# Patient Record
Sex: Female | Born: 1960
Health system: Southern US, Community
[De-identification: ages and names within clinical notes are randomized; demographics above are authoritative.]

## PROBLEM LIST (undated history)

## (undated) DIAGNOSIS — R519 Headache, unspecified: Secondary | ICD-10-CM

## (undated) DIAGNOSIS — M069 Rheumatoid arthritis, unspecified: Secondary | ICD-10-CM

## (undated) DIAGNOSIS — F419 Anxiety disorder, unspecified: Secondary | ICD-10-CM

## (undated) DIAGNOSIS — K219 Gastro-esophageal reflux disease without esophagitis: Secondary | ICD-10-CM

## (undated) DIAGNOSIS — I10 Essential (primary) hypertension: Secondary | ICD-10-CM

## (undated) DIAGNOSIS — K589 Irritable bowel syndrome without diarrhea: Secondary | ICD-10-CM

## (undated) DIAGNOSIS — J302 Other seasonal allergic rhinitis: Secondary | ICD-10-CM

## (undated) DIAGNOSIS — K115 Sialolithiasis: Secondary | ICD-10-CM

## (undated) DIAGNOSIS — R51 Headache: Secondary | ICD-10-CM

## (undated) DIAGNOSIS — F329 Major depressive disorder, single episode, unspecified: Secondary | ICD-10-CM

## (undated) DIAGNOSIS — E119 Type 2 diabetes mellitus without complications: Secondary | ICD-10-CM

## (undated) DIAGNOSIS — E669 Obesity, unspecified: Secondary | ICD-10-CM

## (undated) DIAGNOSIS — E785 Hyperlipidemia, unspecified: Secondary | ICD-10-CM

## (undated) DIAGNOSIS — F32A Depression, unspecified: Secondary | ICD-10-CM

## (undated) DIAGNOSIS — T7840XA Allergy, unspecified, initial encounter: Secondary | ICD-10-CM

## (undated) HISTORY — DX: Sialolithiasis: K11.5

## (undated) HISTORY — PX: TEMPOROMANDIBULAR JOINT SURGERY: SHX35

## (undated) HISTORY — DX: Headache, unspecified: R51.9

## (undated) HISTORY — DX: Essential (primary) hypertension: I10

## (undated) HISTORY — PX: SALIVARY GLAND SURGERY: SHX768

## (undated) HISTORY — DX: Rheumatoid arthritis, unspecified: M06.9

## (undated) HISTORY — DX: Depression, unspecified: F32.A

## (undated) HISTORY — DX: Allergy, unspecified, initial encounter: T78.40XA

## (undated) HISTORY — DX: Anxiety disorder, unspecified: F41.9

## (undated) HISTORY — DX: Other seasonal allergic rhinitis: J30.2

## (undated) HISTORY — DX: Hyperlipidemia, unspecified: E78.5

## (undated) HISTORY — DX: Gastro-esophageal reflux disease without esophagitis: K21.9

## (undated) HISTORY — DX: Headache: R51

## (undated) HISTORY — DX: Irritable bowel syndrome, unspecified: K58.9

## (undated) HISTORY — DX: Obesity, unspecified: E66.9

## (undated) HISTORY — DX: Type 2 diabetes mellitus without complications: E11.9

## (undated) HISTORY — DX: Major depressive disorder, single episode, unspecified: F32.9

---

## 1998-07-01 ENCOUNTER — Other Ambulatory Visit: Admission: RE | Admit: 1998-07-01 | Discharge: 1998-07-01 | Payer: Self-pay | Admitting: Obstetrics and Gynecology

## 1999-09-14 ENCOUNTER — Other Ambulatory Visit: Admission: RE | Admit: 1999-09-14 | Discharge: 1999-09-14 | Payer: Self-pay | Admitting: Obstetrics and Gynecology

## 1999-11-06 HISTORY — PX: ABDOMINAL HYSTERECTOMY: SHX81

## 1999-12-17 ENCOUNTER — Emergency Department (HOSPITAL_COMMUNITY): Admission: EM | Admit: 1999-12-17 | Discharge: 1999-12-17 | Payer: Self-pay | Admitting: Emergency Medicine

## 2000-06-25 ENCOUNTER — Inpatient Hospital Stay (HOSPITAL_COMMUNITY): Admission: RE | Admit: 2000-06-25 | Discharge: 2000-06-27 | Payer: Self-pay | Admitting: Obstetrics and Gynecology

## 2001-06-06 ENCOUNTER — Other Ambulatory Visit: Admission: RE | Admit: 2001-06-06 | Discharge: 2001-06-06 | Payer: Self-pay | Admitting: Obstetrics and Gynecology

## 2001-10-03 ENCOUNTER — Encounter: Payer: Self-pay | Admitting: Family Medicine

## 2001-10-03 ENCOUNTER — Ambulatory Visit (HOSPITAL_COMMUNITY): Admission: RE | Admit: 2001-10-03 | Discharge: 2001-10-03 | Payer: Self-pay | Admitting: Family Medicine

## 2002-07-13 ENCOUNTER — Other Ambulatory Visit: Admission: RE | Admit: 2002-07-13 | Discharge: 2002-07-13 | Payer: Self-pay | Admitting: Obstetrics and Gynecology

## 2003-07-16 ENCOUNTER — Other Ambulatory Visit: Admission: RE | Admit: 2003-07-16 | Discharge: 2003-07-16 | Payer: Self-pay | Admitting: Obstetrics and Gynecology

## 2004-08-11 ENCOUNTER — Other Ambulatory Visit: Admission: RE | Admit: 2004-08-11 | Discharge: 2004-08-11 | Payer: Self-pay | Admitting: Obstetrics and Gynecology

## 2005-09-14 ENCOUNTER — Other Ambulatory Visit: Admission: RE | Admit: 2005-09-14 | Discharge: 2005-09-14 | Payer: Self-pay | Admitting: Obstetrics and Gynecology

## 2010-12-31 ENCOUNTER — Inpatient Hospital Stay (INDEPENDENT_AMBULATORY_CARE_PROVIDER_SITE_OTHER)
Admission: RE | Admit: 2010-12-31 | Discharge: 2010-12-31 | Disposition: A | Discharge status: Home / Self Care | Payer: BC Managed Care – PPO | Source: Ambulatory Visit | Attending: Emergency Medicine | Admitting: Emergency Medicine

## 2010-12-31 ENCOUNTER — Ambulatory Visit (INDEPENDENT_AMBULATORY_CARE_PROVIDER_SITE_OTHER): Payer: BC Managed Care – PPO

## 2010-12-31 DIAGNOSIS — S93609A Unspecified sprain of unspecified foot, initial encounter: Secondary | ICD-10-CM

## 2010-12-31 DIAGNOSIS — M129 Arthropathy, unspecified: Secondary | ICD-10-CM

## 2011-08-16 ENCOUNTER — Ambulatory Visit
Admission: RE | Admit: 2011-08-16 | Discharge: 2011-08-16 | Disposition: A | Discharge status: Home / Self Care | Payer: BC Managed Care – PPO | Source: Ambulatory Visit | Attending: Otolaryngology | Admitting: Otolaryngology

## 2011-08-16 ENCOUNTER — Other Ambulatory Visit: Payer: Self-pay | Admitting: Otolaryngology

## 2011-08-16 DIAGNOSIS — R609 Edema, unspecified: Secondary | ICD-10-CM

## 2011-08-16 MED ORDER — IOHEXOL 300 MG/ML  SOLN: 75.0000 mL | Freq: Once | INTRAMUSCULAR | Status: AC | PRN

## 2012-08-01 ENCOUNTER — Ambulatory Visit: Payer: BC Managed Care – PPO | Admitting: Cardiology

## 2012-08-06 ENCOUNTER — Encounter: Payer: Self-pay | Admitting: *Deleted

## 2012-08-07 ENCOUNTER — Encounter: Payer: Self-pay | Admitting: *Deleted

## 2012-08-07 ENCOUNTER — Encounter: Payer: Self-pay | Admitting: Cardiology

## 2012-08-08 ENCOUNTER — Encounter: Payer: Self-pay | Admitting: Cardiology

## 2012-08-08 ENCOUNTER — Ambulatory Visit (INDEPENDENT_AMBULATORY_CARE_PROVIDER_SITE_OTHER): Payer: BC Managed Care – PPO | Admitting: Cardiology

## 2012-08-08 VITALS — BP 172/101 | HR 86 | Ht 67.0 in | Wt 273.8 lb

## 2012-08-08 DIAGNOSIS — R079 Chest pain, unspecified: Secondary | ICD-10-CM | POA: Insufficient documentation

## 2012-08-08 DIAGNOSIS — E785 Hyperlipidemia, unspecified: Secondary | ICD-10-CM

## 2012-08-08 NOTE — Assessment & Plan Note (Signed)
If functional study negative I discussed the importance of exercise and weight loss.

## 2012-08-08 NOTE — Assessment & Plan Note (Signed)
Symptoms atypical. However she does have dyspnea on exertion. Multiple risk factors including strong family history. Plan stress echocardiogram to exclude ischemia to quantify LV function. If normal no further evaluation planned.

## 2012-08-08 NOTE — Assessment & Plan Note (Signed)
Management per primary care. 

## 2012-08-08 NOTE — Progress Notes (Signed)
HPI: 51 year old female with no prior cardiac history evaluation of chest pain. Chest x-ray in June of 2013 unremarkable. Patient complains of intermittent chest pain for approximately 4 months. She has had significant stress at her job and attributes the pain to this. The pain is in various locations on her chest. It is described as a stabbing pain lasting 1 second. It does not radiate. No associated symptoms. Resolves spontaneously. She does not have exertional chest pain. She does have some dyspnea on exertion but no orthopnea, PND, pedal edema or syncope. Because of her chest pain we are asked to evaluate.  Current Outpatient Prescriptions  Medication Sig Dispense Refill  . ALPRAZolam (XANAX) 0.25 MG tablet Take 0.25 mg by mouth as needed.       . fluticasone (FLONASE) 50 MCG/ACT nasal spray Place 2 sprays into the nose as needed.       Marland Kitchen ibuprofen (ADVIL,MOTRIN) 800 MG tablet Take 800 mg by mouth as needed.       Marland Kitchen levocetirizine (XYZAL) 5 MG tablet Take 5 mg by mouth daily.       Marland Kitchen NEXIUM 40 MG capsule Take 40 mg by mouth daily.       Marland Kitchen PRENATAL VITAMINS PO Take 1 tablet by mouth daily.      . sucralfate (CARAFATE) 1 G tablet Take 1 g by mouth 4 (four) times daily.         Allergies  Allergen Reactions  . Codeine Nausea Only  . Topamax (Topiramate) Nausea Only    Past Medical History  Diagnosis Date  . Seasonal allergies   . Obesity   . Hyperlipidemia   . GERD (gastroesophageal reflux disease)     Past Surgical History  Procedure Date  . Temporomandibular joint surgery   . Abdominal hysterectomy   . Salivary gland surgery     History   Social History  . Marital Status: Single    Spouse Name: N/A    Number of Children: N/A  . Years of Education: N/A   Occupational History  . Not on file.   Social History Main Topics  . Smoking status: Former Smoker -- 7 years    Types: Cigarettes  . Smokeless tobacco: Not on file   Comment: 1.5 pack a week  . Alcohol Use: Yes       Rare  . Drug Use: No  . Sexually Active: Not on file   Other Topics Concern  . Not on file   Social History Narrative  . No narrative on file    Family History  Problem Relation Age of Onset  . Lung cancer    . Hypertension    . Heart attack Father     MI at age 75  . Coronary artery disease Brother   . Coronary artery disease Brother     ROS:  no fevers or chills, productive cough, hemoptysis, dysphasia, odynophagia, melena, hematochezia, dysuria, hematuria, rash, seizure activity, orthopnea, PND, pedal edema, claudication. Remaining systems are negative.  Physical Exam:   Blood pressure 172/101, pulse 86, height 5\' 7"  (1.702 m), weight 273 lb 12.8 oz (124.195 kg).  General:  Well developed/morbidly obese in NAD Skin warm/dry Patient not depressed No peripheral clubbing Back-normal HEENT-normal/normal eyelids Neck supple/normal carotid upstroke bilaterally; no bruits; no JVD; no thyromegaly chest - CTA/ normal expansion CV - RRR/normal S1 and S2; no murmurs, rubs or gallops;  PMI nondisplaced Abdomen -NT/ND, no HSM, no mass, + bowel sounds, no bruit 2+ femoral pulses, no  bruits Ext-no edema, chords, 2+ DP Neuro-grossly nonfocal  ECG 04/07/2012-sinus rhythm with no ST changes. Sinus rhythm at a rate of 81. Axis normal. No significant ST changes.

## 2012-08-08 NOTE — Patient Instructions (Signed)
Your physician recommends that you schedule a follow-up appointment in: AS NEEDED PENDING TEST RESULTS  Your physician has requested that you have a stress echocardiogram. For further information please visit [email or web address]. Please follow instruction sheet as given.    

## 2012-08-18 ENCOUNTER — Encounter: Payer: Self-pay | Admitting: Cardiology

## 2012-08-29 ENCOUNTER — Other Ambulatory Visit (HOSPITAL_COMMUNITY): Payer: BC Managed Care – PPO

## 2012-09-12 ENCOUNTER — Ambulatory Visit (HOSPITAL_COMMUNITY): Payer: BC Managed Care – PPO | Attending: Cardiology

## 2012-09-12 ENCOUNTER — Ambulatory Visit (HOSPITAL_BASED_OUTPATIENT_CLINIC_OR_DEPARTMENT_OTHER): Payer: BC Managed Care – PPO

## 2012-09-12 ENCOUNTER — Encounter: Payer: Self-pay | Admitting: Cardiology

## 2012-09-12 DIAGNOSIS — R0989 Other specified symptoms and signs involving the circulatory and respiratory systems: Secondary | ICD-10-CM

## 2012-09-12 DIAGNOSIS — Z8249 Family history of ischemic heart disease and other diseases of the circulatory system: Secondary | ICD-10-CM | POA: Insufficient documentation

## 2012-09-12 DIAGNOSIS — R079 Chest pain, unspecified: Secondary | ICD-10-CM

## 2012-09-12 DIAGNOSIS — R072 Precordial pain: Secondary | ICD-10-CM | POA: Insufficient documentation

## 2012-09-12 NOTE — Progress Notes (Signed)
Echocardiogram performed.  

## 2012-09-16 ENCOUNTER — Telehealth: Payer: Self-pay | Admitting: Cardiology

## 2012-09-16 NOTE — Telephone Encounter (Signed)
**Note De-Identified Kelly [name] Obfuscation** Pt. given results of her Stress Echo, she verbalized understanding. Per her request, copy of test faxed to Dr. Tanya Nones and mailed to pt. along with last OV notes.

## 2012-09-16 NOTE — Telephone Encounter (Signed)
LMTCB

## 2012-09-16 NOTE — Telephone Encounter (Signed)
New problem:    Returning call back to [place] from yesterday.

## 2013-01-28 ENCOUNTER — Encounter: Payer: Self-pay | Admitting: Family Medicine

## 2013-01-28 ENCOUNTER — Ambulatory Visit (INDEPENDENT_AMBULATORY_CARE_PROVIDER_SITE_OTHER): Payer: BC Managed Care – PPO | Admitting: Family Medicine

## 2013-01-28 VITALS — BP 132/78 | HR 81 | Temp 97.9°F | Resp 18 | Wt 289.0 lb

## 2013-01-28 DIAGNOSIS — M79622 Pain in left upper arm: Secondary | ICD-10-CM

## 2013-01-28 DIAGNOSIS — M79609 Pain in unspecified limb: Secondary | ICD-10-CM

## 2013-01-28 DIAGNOSIS — K219 Gastro-esophageal reflux disease without esophagitis: Secondary | ICD-10-CM

## 2013-01-28 DIAGNOSIS — M75100 Unspecified rotator cuff tear or rupture of unspecified shoulder, not specified as traumatic: Secondary | ICD-10-CM

## 2013-01-28 DIAGNOSIS — M7552 Bursitis of left shoulder: Secondary | ICD-10-CM

## 2013-01-28 MED ORDER — PREDNISONE 20 MG PO TABS: ORAL_TABLET | ORAL | Status: DC

## 2013-01-28 NOTE — Progress Notes (Signed)
Subjective:     Patient ID: Kelly [name], female   DOB: [birth year], 52 y.o.   MRN: [id number]  HPI 52 year old female who presents with left shoulder pain for 4 weeks.  The pain is worse with abduction of the left arm, raising her arm above her head, internal rotation of her arm.  She denies numbness, paresthesias or dysesthesias in her left hand.  He denies neck pain.  He denies chest pain, shortness of breath, or dyspnea on exertion. Past Medical History  Diagnosis Date  . Seasonal allergies   . Obesity   . Hyperlipidemia   . GERD (gastroesophageal reflux disease)    Current Outpatient Prescriptions on File Prior to Visit  Medication Sig Dispense Refill  . ALPRAZolam (XANAX) 0.25 MG tablet Take 0.25 mg by mouth as needed.       . fluticasone (FLONASE) 50 MCG/ACT nasal spray Place 2 sprays into the nose as needed.       Marland Kitchen ibuprofen (ADVIL,MOTRIN) 800 MG tablet Take 800 mg by mouth as needed.       Marland Kitchen levocetirizine (XYZAL) 5 MG tablet Take 5 mg by mouth daily.       Marland Kitchen NEXIUM 40 MG capsule Take 40 mg by mouth daily.        No current facility-administered medications on file prior to visit.     Review of Systems  Constitutional: Negative.   HENT: Negative.   Eyes: Negative.   Respiratory: Negative.   Cardiovascular: Negative.   Gastrointestinal: Negative.   Musculoskeletal: Positive for myalgias and arthralgias. Negative for joint swelling.  Neurological: Negative.        Objective:   Physical Exam  Constitutional: She appears well-developed and well-nourished.  HENT:  Head: Normocephalic and atraumatic.  Right Ear: External ear normal.  Left Ear: External ear normal.  Eyes: Conjunctivae are normal. Pupils are equal, round, and reactive to light.  Neck: Normal range of motion. Neck supple. No thyromegaly present.  Cardiovascular: Normal rate, regular rhythm and normal heart sounds.   No murmur heard. Pulmonary/Chest: Effort normal and breath sounds normal. No respiratory  distress. She has no wheezes. She has no rales.  Abdominal: Soft. Bowel sounds are normal.  Musculoskeletal:       Left shoulder: She exhibits decreased range of motion and tenderness. She exhibits no bony tenderness, no swelling, no effusion, no crepitus, no spasm and normal strength.  Lymphadenopathy:    She has no cervical adenopathy.  Neurological: She has normal reflexes.   she has pain with Hawkins maneuver and empty can maneuver and the left shoulder. She has a negative Spurling sign.     Assessment:     Left shoulder and left upper arm pain, suspect subacromial bursitis.     Plan:     She declines cortisone injection. Again prednisone 20 mg tablets used to take 3 tablets by mouth on days 1 and 2, 2 tablets by mouth on day 3-4, and one tablet by mouth on days 5-6. Recheck in 2 weeks

## 2013-02-03 ENCOUNTER — Telehealth: Payer: Self-pay | Admitting: Family Medicine

## 2013-02-03 MED ORDER — PHENTERMINE HCL 37.5 MG PO CAPS: 37.5000 mg | ORAL_CAPSULE | ORAL | Status: DC

## 2013-02-03 NOTE — Telephone Encounter (Signed)
Yes, but she'll have to come pick up hard copy.

## 2013-02-03 NOTE — Telephone Encounter (Signed)
Was able to call med to pharmacy and pt aware

## 2013-02-17 ENCOUNTER — Ambulatory Visit (INDEPENDENT_AMBULATORY_CARE_PROVIDER_SITE_OTHER): Payer: BC Managed Care – PPO | Admitting: Family Medicine

## 2013-02-17 VITALS — BP 153/84 | HR 109 | Temp 98.0°F | Resp 16 | Ht 67.0 in | Wt 278.0 lb

## 2013-02-17 DIAGNOSIS — K600 Acute anal fissure: Secondary | ICD-10-CM

## 2013-02-17 DIAGNOSIS — K648 Other hemorrhoids: Secondary | ICD-10-CM

## 2013-02-17 DIAGNOSIS — K602 Anal fissure, unspecified: Secondary | ICD-10-CM

## 2013-02-17 DIAGNOSIS — K644 Residual hemorrhoidal skin tags: Secondary | ICD-10-CM

## 2013-02-17 MED ORDER — HYDROCORTISONE ACETATE 25 MG RE SUPP: 25.0000 mg | Freq: Two times a day (BID) | RECTAL | Status: DC

## 2013-02-17 MED ORDER — DILTIAZEM GEL 2 %: 1.0000 "application " | Freq: Two times a day (BID) | CUTANEOUS | Status: DC

## 2013-02-17 MED ORDER — LIDOCAINE HCL 2 % EX GEL: Freq: Three times a day (TID) | CUTANEOUS | Status: DC

## 2013-02-17 NOTE — Progress Notes (Signed)
Subjective:    Patient ID: Kelly [name], female    DOB: 05/04/1961, 52 y.o.   MRN: [id number] Chief Complaint  Patient presents with  . Hemorrhoids    x 1 week     HPI  Having a flair of hemorrhoids since constipation BM last wk and since then has been using preparation H cream and suppositories without relief and sitzs baths.  Has been taking miralax since then IBS - constipation pre-dominant.  Sees Dr. Matthias Hughs.  For the past wk, she has been having painful BM due to hemorrhoids and she is concerned about a fissure as well.  She also has been having a moderate amount of blood on the exterior of some stools since then.  Past Medical History  Diagnosis Date  . Seasonal allergies   . Obesity   . Hyperlipidemia   . GERD (gastroesophageal reflux disease)    Current Outpatient Prescriptions on File Prior to Visit  Medication Sig Dispense Refill  . ALPRAZolam (XANAX) 0.25 MG tablet Take 0.25 mg by mouth as needed.       . etodolac (LODINE) 400 MG tablet Take 400 mg by mouth 2 (two) times daily as needed.      . fluticasone (FLONASE) 50 MCG/ACT nasal spray Place 2 sprays into the nose as needed.       Marland Kitchen ibuprofen (ADVIL,MOTRIN) 800 MG tablet Take 800 mg by mouth as needed.       Marland Kitchen levocetirizine (XYZAL) 5 MG tablet Take 5 mg by mouth daily.       Marland Kitchen NEXIUM 40 MG capsule Take 40 mg by mouth daily.       . phentermine 37.5 MG capsule Take 1 capsule (37.5 mg total) by mouth every morning.  30 capsule  1  . predniSONE (DELTASONE) 20 MG tablet Take 3 pills a day for days 1-2, 2 pills per day on days 3-4  and 1 pill per day on days 5-6  12 tablet  0   No current facility-administered medications on file prior to visit.   Allergies  Allergen Reactions  . Codeine Nausea Only  . Topamax (Topiramate) Nausea Only    Review of Systems  Constitutional: Negative for fever, chills, diaphoresis, activity change, appetite change, fatigue and unexpected weight change.  Gastrointestinal: Positive for  abdominal pain, constipation, blood in stool, anal bleeding and rectal pain. Negative for diarrhea.  Genitourinary: Negative for dysuria, urgency, frequency, hematuria, decreased urine volume, vaginal bleeding, vaginal discharge, difficulty urinating, genital sores, vaginal pain, menstrual problem, pelvic pain and dyspareunia.  Musculoskeletal: Negative for gait problem.  Skin: Negative for rash.  Hematological: Negative for adenopathy.  Psychiatric/Behavioral: The patient is not nervous/anxious.       BP 153/84  Pulse 109  Temp(Src) 98 F (36.7 C) (Oral)  Resp 16  Ht 5\' 7"  (1.702 m)  Wt 278 lb (126.1 kg)  BMI 43.53 kg/m2  SpO2 98% Objective:   Physical Exam  Constitutional: She is oriented to person, place, and time. She appears well-developed and well-nourished. No distress.  HENT:  Head: Normocephalic and atraumatic.  Right Ear: External ear normal.  Eyes: Conjunctivae are normal. No scleral icterus.  Pulmonary/Chest: Effort normal.  Genitourinary: Rectal exam shows external hemorrhoid, internal hemorrhoid, fissure and tenderness. Rectal exam shows anal tone normal.  Two small soft non-tender non-thrombosed external hemorrhoids at 12 o'clock and 4 o'clock with 1 tender prolapsed internal hemorrhoid reducible at 2 o'clock with small superficial fissure seen on anoscopy proximal to this.  Neurological:  She is alert and oriented to person, place, and time.  Skin: Skin is warm and dry. She is not diaphoretic. No erythema.  Psychiatric: She has a normal mood and affect. Her behavior is normal.      Assessment & Plan:  Internal and external hemorrhoids - try anusol suppositories, need to keep reducing the prolapsed internal hemorrhoid manually after each BM - hopefully the suppository will help with this.  Can try prn topical lidocaine and f/u w/ Dr. Matthias Hughs.  Avoid constipation by regular miralax use.  Anal fissure - small - cont sitz baths and try topical dilt gel.  Elevated BP  and tachycardia - likely due to pt discomfort and anxiety about exam but newly started on phenteramine so rec recheck in several wks w/ PCP.

## 2013-02-17 NOTE — Patient Instructions (Addendum)
Hemorrhoids Hemorrhoids are enlarged (dilated) veins around the rectum. There are 2 types of hemorrhoids, and the type of hemorrhoid is determined by its location. Internal hemorrhoids occur in the veins just inside the rectum.They are usually not painful, but they may bleed.However, they may poke through to the outside and become irritated and painful. External hemorrhoids involve the veins outside the anus and can be felt as a painful swelling or hard lump near the anus.They are often itchy and may crack and bleed. Sometimes clots will form in the veins. This makes them swollen and painful. These are called thrombosed hemorrhoids. CAUSES Causes of hemorrhoids include:  Pregnancy. This increases the pressure in the hemorrhoidal veins.  Constipation.  Straining to have a bowel movement.  Obesity.  Heavy lifting or other activity that caused you to strain. TREATMENT Most of the time hemorrhoids improve in 1 to 2 weeks. However, if symptoms do not seem to be getting better or if you have a lot of rectal bleeding, your caregiver may perform a procedure to help make the hemorrhoids get smaller or remove them completely.Possible treatments include:  Rubber band ligation. A rubber band is placed at the base of the hemorrhoid to cut off the circulation.  Sclerotherapy. A chemical is injected to shrink the hemorrhoid.  Infrared light therapy. Tools are used to burn the hemorrhoid.  Hemorrhoidectomy. This is surgical removal of the hemorrhoid. HOME CARE INSTRUCTIONS   Increase fiber in your diet. Ask your caregiver about using fiber supplements.  Drink enough water and fluids to keep your urine clear or pale yellow.  Exercise regularly.  Go to the bathroom when you have the urge to have a bowel movement. Do not wait.  Avoid straining to have bowel movements.  Keep the anal area dry and clean.  Only take over-the-counter or prescription medicines for pain, discomfort, or fever as  directed by your caregiver. If your hemorrhoids are thrombosed:  Take warm sitz baths for 20 to 30 minutes, 3 to 4 times per day.  If the hemorrhoids are very tender and swollen, place ice packs on the area as tolerated. Using ice packs between sitz baths may be helpful. Fill a plastic bag with ice. Place a towel between the bag of ice and your skin.  Medicated creams and suppositories may be used or applied as directed.  Do not use a donut-shaped pillow or sit on the toilet for long periods. This increases blood pooling and pain. SEEK MEDICAL CARE IF:   You have increasing pain and swelling that is not controlled with your medicine.  You have uncontrolled bleeding.  You have difficulty or you are unable to have a bowel movement.  You have pain or inflammation outside the area of the hemorrhoids.  You have chills or an oral temperature above 102 F (38.9 C). MAKE SURE YOU:   Understand these instructions.  Will watch your condition.  Will get help right away if you are not doing well or get worse. Document Released: 10/19/2000 Document Revised: 01/14/2012 Document Reviewed: 10/02/2010 [facility] Patient Information 2013 [place], [place]. Anal Fissure, Adult An anal fissure is a small tear or crack in the skin around the anus. Bleeding from a fissure usually stops on its own within a few minutes. However, bleeding will often reoccur with each bowel movement until the crack heals.  CAUSES   Passing large, hard stools.  Frequent diarrheal stools.  Constipation.  Inflammatory bowel disease (Crohn's disease or ulcerative colitis).  Infections.  Anal sex. SYMPTOMS  Small amounts of blood seen on your stools, on toilet paper, or in the toilet after a bowel movement.  Rectal bleeding.  Painful bowel movements.  Itching or irritation around the anus. DIAGNOSIS Your caregiver will examine the anal area. An anal fissure can usually be seen with careful inspection. A rectal  exam may be performed and a short tube (anoscope) may be used to examine the anal canal. TREATMENT   You may be instructed to take fiber supplements. These supplements can soften your stool to help make bowel movements easier.  Sitz baths may be recommended to help heal the tear. Do not use soap in the sitz baths.  A medicated cream or ointment may be prescribed to lessen discomfort. HOME CARE INSTRUCTIONS   Maintain a diet high in fruits, whole grains, and vegetables. Avoid constipating foods like bananas and dairy products.  Take sitz baths as directed by your caregiver.  Drink enough fluids to keep your urine clear or pale yellow.  Only take over-the-counter or prescription medicines for pain, discomfort, or fever as directed by your caregiver. Do not take aspirin as this may increase bleeding.  Do not use ointments containing numbing medications (anesthetics) or hydrocortisone. They could slow healing. SEEK MEDICAL CARE IF:   Your fissure is not completely healed within 3 days.  You have further bleeding.  You have a fever.  You have diarrhea mixed with blood.  You have pain.  Your problem is getting worse rather than better. MAKE SURE YOU:   Understand these instructions.  Will watch your condition.  Will get help right away if you are not doing well or get worse. Document Released: 10/22/2005 Document Revised: 01/14/2012 Document Reviewed: 04/08/2011 [facility] Patient Information 2013 [place], [place].

## 2013-02-18 ENCOUNTER — Encounter: Payer: Self-pay | Admitting: Family Medicine

## 2013-02-18 DIAGNOSIS — R519 Headache, unspecified: Secondary | ICD-10-CM | POA: Insufficient documentation

## 2013-02-18 DIAGNOSIS — F32A Depression, unspecified: Secondary | ICD-10-CM | POA: Insufficient documentation

## 2013-02-18 DIAGNOSIS — F329 Major depressive disorder, single episode, unspecified: Secondary | ICD-10-CM | POA: Insufficient documentation

## 2013-02-18 DIAGNOSIS — J302 Other seasonal allergic rhinitis: Secondary | ICD-10-CM | POA: Insufficient documentation

## 2013-02-18 DIAGNOSIS — K581 Irritable bowel syndrome with constipation: Secondary | ICD-10-CM | POA: Insufficient documentation

## 2013-02-23 ENCOUNTER — Ambulatory Visit: Payer: BC Managed Care – PPO | Admitting: Physician Assistant

## 2013-03-04 ENCOUNTER — Ambulatory Visit (INDEPENDENT_AMBULATORY_CARE_PROVIDER_SITE_OTHER): Payer: BC Managed Care – PPO | Admitting: Surgery

## 2013-03-04 ENCOUNTER — Encounter (INDEPENDENT_AMBULATORY_CARE_PROVIDER_SITE_OTHER): Payer: Self-pay | Admitting: Surgery

## 2013-03-04 VITALS — BP 132/82 | HR 84 | Resp 18 | Ht 67.0 in | Wt 282.0 lb

## 2013-03-04 DIAGNOSIS — K581 Irritable bowel syndrome with constipation: Secondary | ICD-10-CM

## 2013-03-04 DIAGNOSIS — K644 Residual hemorrhoidal skin tags: Secondary | ICD-10-CM

## 2013-03-04 DIAGNOSIS — K602 Anal fissure, unspecified: Secondary | ICD-10-CM

## 2013-03-04 DIAGNOSIS — K648 Other hemorrhoids: Secondary | ICD-10-CM

## 2013-03-04 MED ORDER — AMBULATORY NON FORMULARY MEDICATION: 1.0000 "application " | Freq: Four times a day (QID) | Status: DC

## 2013-03-04 NOTE — Progress Notes (Signed)
Subjective:     Patient ID: Kelly [name], female   DOB: Nov 19, 1960, 52 y.o.   MRN: [id number]  HPI  Kelly [name]  1961-03-16 [id number]  Patient Care Team: Donita Brooks, MD as PCP - General (Family Medicine) Graylin Shiver, MD as Consulting Physician (Gastroenterology) Sherren Mocha, MD as Attending Physician (Family Medicine) Ardeth Sportsman, MD as Consulting Physician (General Surgery)  This patient is a 52 y.o.female who presents today for surgical evaluation at the request of Dr. Evette Cristal.   Reason for visit: Anal pain.  Question of fissure vs. Hemorrhoids.  Pleasant morbidly obese female.  Struggles with intermittent constipation and diarrhea, primarily constipation.  She has been told she has irritable bowel syndrome.  Developed anal pain and diagnosed with a fissure.  Treated topically in 2011.  Mostly improved.  Does get chronic intermittent pain and discomfort.  More recently, an episode of more severe pain.  Blood with wiping as well.  Saw her gastroenterologist.  Dr. Evette Cristal was concerned for a fissure.  Recommended surgical evaluation.  She has been taking diltiazem cream anally 1-2 times a day.  Has tried other creams and salves.  Some use of suppositories in the past.  No anorectal interventions.  No prior bandings.  No sphincterotomies done.  Patient Active Problem List   Diagnosis Date Noted  . 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 Hemorrhoids, internal 03/04/2013  . External hemorrhoids with pain 03/04/2013  . Depression   . Seasonal allergies   . Irritable bowel syndrome with constipation >> diarrhea   . Headache disorder   . GERD (gastroesophageal reflux disease) 01/28/2013  . Chest pain 08/08/2012  . Hyperlipidemia 08/08/2012  . Obesity, Class III, BMI 40-49.9 (morbid obesity) 08/08/2012    Past Medical History  Diagnosis Date  . Obesity   . Hyperlipidemia   . GERD (gastroesophageal reflux disease)   . Depression   . Seasonal allergies   .  IBS (irritable bowel syndrome)   . Headache disorder   . Salivary calculus   . Salivary calculus     Past Surgical History  Procedure Laterality Date  . Temporomandibular joint surgery      3 times  . Abdominal hysterectomy  2001    partial hysterectomy  . Salivary gland surgery      History   Social History  . Marital Status: Single    Spouse Name: N/A    Number of Children: N/A  . Years of Education: N/A   Occupational History  . Not on file.   Social History Main Topics  . Smoking status: Former Smoker -- 7 years    Types: Cigarettes  . Smokeless tobacco: Not on file     Comment: 1.5 pack a week  . Alcohol Use: No     Comment: Rare  . Drug Use: No  . Sexually Active: Not on file   Other Topics Concern  . Not on file   Social History Narrative  . No narrative on file    Family History  Problem Relation Age of Onset  . Lung cancer    . Hypertension    . Heart attack Father     MI at age 58  . Coronary artery disease Brother   . Coronary artery disease Brother     Current Outpatient Prescriptions  Medication Sig Dispense Refill  . ALPRAZolam (XANAX) 0.25 MG tablet Take 0.25 mg by mouth as needed.       . betamethasone  valerate (VALISONE) 0.1 % cream       . diltiazem 2 % GEL Apply 1 application topically 2 (two) times daily.  30 g  0  . fluticasone (FLONASE) 50 MCG/ACT nasal spray Place 2 sprays into the nose as needed.       . hydrocortisone (ANUSOL-HC) 25 MG suppository Place 1 suppository (25 mg total) rectally 2 (two) times daily.  24 suppository  0  . ibuprofen (ADVIL,MOTRIN) 800 MG tablet Take 800 mg by mouth as needed.       Marland Kitchen levocetirizine (XYZAL) 5 MG tablet Take 5 mg by mouth daily.       Marland Kitchen NEXIUM 40 MG capsule Take 40 mg by mouth daily.       . AMBULATORY NON FORMULARY MEDICATION Place 1 application rectally 4 (four) times daily. Diltiazem 2% compounded suspension.  1 Tube  2  . escitalopram (LEXAPRO) 10 MG tablet Take 10 mg by mouth daily.       Marland Kitchen etodolac (LODINE) 400 MG tablet Take 400 mg by mouth 2 (two) times daily as needed.      . lidocaine (XYLOCAINE JELLY) 2 % jelly Apply topically 3 (three) times daily.  30 mL  0  . phentermine 37.5 MG capsule Take 1 capsule (37.5 mg total) by mouth every morning.  30 capsule  1  . predniSONE (DELTASONE) 20 MG tablet Take 3 pills a day for days 1-2, 2 pills per day on days 3-4  and 1 pill per day on days 5-6  12 tablet  0   No current facility-administered medications for this visit.     Allergies  Allergen Reactions  . Codeine Nausea Only  . Topamax (Topiramate) Nausea Only  . Maxalt (Rizatriptan)     BP 132/82  Pulse 84  Resp 18  Ht 5\' 7"  (1.702 m)  Wt 282 lb (127.914 kg)  BMI 44.16 kg/m2  No results found.   Review of Systems  Constitutional: Negative for fever, chills, diaphoresis, appetite change and fatigue.  HENT: Negative for ear pain, sore throat, trouble swallowing, neck pain and ear discharge.   Eyes: Negative for photophobia, discharge and visual disturbance.  Respiratory: Negative for cough, choking, chest tightness and shortness of breath.   Cardiovascular: Negative for chest pain and palpitations.  Gastrointestinal: Positive for diarrhea, constipation, anal bleeding and rectal pain. Negative for nausea, vomiting and abdominal pain.  Genitourinary: Negative for dysuria, frequency and difficulty urinating.  Musculoskeletal: Negative for myalgias and gait problem.  Skin: Negative for color change, pallor and rash.  Neurological: Negative for dizziness, speech difficulty, weakness and numbness.  Hematological: Negative for adenopathy.  Psychiatric/Behavioral: Negative for confusion and agitation. The patient is not nervous/anxious.        Objective:   Physical Exam  Constitutional: She is oriented to person, place, and time. She appears well-developed and well-nourished. No distress.  HENT:  Head: Normocephalic.  Mouth/Throat: Oropharynx is clear and  moist. No oropharyngeal exudate.  Eyes: Conjunctivae and EOM are normal. Pupils are equal, round, and reactive to light. No scleral icterus.  Neck: Normal range of motion. Neck supple. No tracheal deviation present.  Cardiovascular: Normal rate, regular rhythm and intact distal pulses.   Pulmonary/Chest: Effort normal and breath sounds normal. No respiratory distress. She exhibits no tenderness.  Abdominal: Soft. She exhibits no distension and no mass. There is no tenderness. Hernia confirmed negative in the right inguinal area and confirmed negative in the left inguinal area.  Genitourinary: Vagina normal. No vaginal  discharge found.  Exam done with assistance of female Medical Assistant in the room.  Perianal skin clean with good hygiene.  No pruritis.  No pilonidal disease.  No abscess/fistula.    Posterior midline chronic fissure.  Only a few millimeters open.  Moderate size posterior midline skin tags/sentinel tags as well.  Tolerates digital and anoscopic rectal exam.   Upper normal/tight sphincter tone.  No rectal masses.  Hemorrhoidal piles internally only mildly inflamed   Musculoskeletal: Normal range of motion. She exhibits no tenderness.  Lymphadenopathy:    She has no cervical adenopathy.       Right: No inguinal adenopathy present.       Left: No inguinal adenopathy present.  Neurological: She is alert and oriented to person, place, and time. No cranial nerve deficit. She exhibits normal muscle tone. Coordination normal.  Skin: Skin is warm and dry. No rash noted. She is not diaphoretic. No erythema.  Psychiatric: She has a normal mood and affect. Her behavior is normal. Judgment and thought content normal.       Assessment:     Chronic recurrent anal fissure.  External skin tags.  Probable sentinel tags.  Sensitive.  Mild internal hemorrhoids.    Plan:     The fissure looks like it is mostly healed.  The fact that she could tolerate digital anoscopic examination  argues against a severe fissure with lack of intractable fissure pain.  I strongly recommend she increase the diltiazem 2% compounded cream to four times a day.  We sent a new prescription.  I noted that that usually he will sit and can avoid a sphincterotomy.  If not, consider surgery.  The anatomy & physiology of the anorectal region was discussed.  The pathophysiology of anal fissure and differential diagnosis was discussed.  Natural history progression  was discussed.   I stressed the importance of a bowel regimen to have daily soft bowel movements to minimize progression of disease.   I discussed the use of warm soaks &  muscle relaxant, diltiazem, to help the anal sphincter relax, allow the spasming to stop, and help the tear/fissure to heal.  If non-operative treatment does not heal the fissure, I would recommend examination under anesthesia for better examination to confirm the diagnosis and treat by lateral internal sphincterotomy to allow the fissure to heal.  Technique, benefits, alternatives discussed.  Risks such as bleeding, pain, incontinence, recurrence, heart attack, death, and other risks were discussed.    Educational handouts further explaining the pathology, treatment options, and bowel regimen were given as well.  The patient expressed understanding & will follow up PRN.  If the pain does not resolve in a few weeks or worsens, she should proceed with surgery.  I suspect that the anal skin tags or sentinel tags from chronic fissures.  Seemed the most sensitive area.  Offered to remove these.  I would do it in the operating room.  We will hold off and see if things to calm down first.  Otherwise consider examination under anesthesia and hemorrhoidectomy of external tags:  The anatomy & physiology of the anorectal region was discussed.  The pathophysiology of hemorrhoids and differential diagnosis was discussed.  Natural history risks without surgery was discussed.   I stressed the  importance of a bowel regimen to have daily soft bowel movements to minimize progression of disease.  Interventions such as sclerotherapy & banding were discussed.  The patient's symptoms are not adequately controlled by medicines and other non-operative treatments.  I feel the risks & problems of no surgery outweigh the operative risks; therefore, I recommended surgery to treat the hemorrhoids by ligation, pexy, and possible resection.  Risks such as bleeding, infection, need for further treatment, heart attack, death, and other risks were discussed.   I noted a good likelihood this will help address the problem.  Goals of post-operative recovery were discussed as well.  Possibility that this will not correct all symptoms was explained.  Post-operative pain, bleeding, constipation, and other problems after surgery were discussed.  We will work to minimize complications.   Educational handouts further explaining the pathology, treatment options, and bowel regimen were given as well.  Questions were answered.  The patient expresses understanding & wishes to proceed with surgery.

## 2013-03-04 NOTE — Patient Instructions (Addendum)
Increase diltiazem cream to four times a day.  Warm soaks.  Get your constipation under control.  Normal is bowel movement every other day to two times a day.    If getting better, followup as needed.  If not improved or worsening, consider surgery for more aggressive treatment as we discussed  Anal Fissure, Adult An anal fissure is a small tear or crack in the skin around the anus. Bleeding from a fissure usually stops on its own within a few minutes. However, bleeding will often reoccur with each bowel movement until the crack heals.  CAUSES   Passing large, hard stools.  Frequent diarrheal stools.  Constipation.  Inflammatory bowel disease (Crohn's disease or ulcerative colitis).  Infections.  Anal sex. SYMPTOMS   Small amounts of blood seen on your stools, on toilet paper, or in the toilet after a bowel movement.  Rectal bleeding.  Painful bowel movements.  Itching or irritation around the anus. DIAGNOSIS Your caregiver will examine the anal area. An anal fissure can usually be seen with careful inspection. A rectal exam may be performed and a short tube (anoscope) may be used to examine the anal canal. TREATMENT   You may be instructed to take fiber supplements. These supplements can soften your stool to help make bowel movements easier.  Sitz baths may be recommended to help heal the tear. Do not use soap in the sitz baths.  A medicated cream or ointment may be prescribed to lessen discomfort. HOME CARE INSTRUCTIONS   Maintain a diet high in fruits, whole grains, and vegetables. Avoid constipating foods like bananas and dairy products.  Take sitz baths as directed by your caregiver.  Drink enough fluids to keep your urine clear or pale yellow.  Only take over-the-counter or prescription medicines for pain, discomfort, or fever as directed by your caregiver. Do not take aspirin as this may increase bleeding.  Do not use ointments containing numbing medications  (anesthetics) or hydrocortisone. They could slow healing. SEEK MEDICAL CARE IF:   Your fissure is not completely healed within 3 days.  You have further bleeding.  You have a fever.  You have diarrhea mixed with blood.  You have pain.  Your problem is getting worse rather than better. MAKE SURE YOU:   Understand these instructions.  Will watch your condition.  Will get help right away if you are not doing well or get worse. Document Released: 10/22/2005 Document Revised: 01/14/2012 Document Reviewed: 04/08/2011 [employer] Patient Information 2013 [place], [place].  GETTING TO GOOD BOWEL HEALTH. Irregular bowel habits such as constipation and diarrhea can lead to many problems over time.  Having one soft bowel movement a day is the most important way to prevent further problems.  The anorectal canal is designed to handle stretching and feces to safely manage our ability to get rid of solid waste (feces, poop, stool) out of our body.  BUT, hard constipated stools can act like ripping concrete bricks and diarrhea can be a burning fire to this very sensitive area of our body, causing inflamed hemorrhoids, anal fissures, increasing risk is perirectal abscesses, abdominal pain/bloating, an making irritable bowel worse.     The goal: ONE SOFT BOWEL MOVEMENT A DAY!  To have soft, regular bowel movements:    Drink at least 8 tall glasses of water a day.     Take plenty of fiber.  Fiber is the undigested part of plant food that passes into the colon, acting s "natures broom" to encourage bowel motility  and movement.  Fiber can absorb and hold large amounts of water. This results in a larger, bulkier stool, which is soft and easier to pass. Work gradually over several weeks up to 6 servings a day of fiber (25g a day even more if needed) in the form of: o Vegetables -- Root (potatoes, carrots, turnips), leafy green (lettuce, salad greens, celery, spinach), or cooked high residue (cabbage, broccoli,  etc) o Fruit -- Fresh (unpeeled skin & pulp), Dried (prunes, apricots, cherries, etc ),  or stewed ( applesauce)  o Whole grain breads, pasta, etc (whole wheat)  o Bran cereals    Bulking Agents -- This type of water-retaining fiber generally is easily obtained each day by one of the following:  o Psyllium bran -- The psyllium plant is remarkable because its ground seeds can retain so much water. This product is available as Metamucil, Konsyl, Effersyllium, Per Diem Fiber, or the less expensive generic preparation in drug and health food stores. Although labeled a laxative, it really is not a laxative.  o Methylcellulose -- This is another fiber derived from wood which also retains water. It is available as Citrucel. o Polyethylene Glycol - and "artificial" fiber commonly called Miralax or Glycolax.  It is helpful for people with gassy or bloated feelings with regular fiber o Flax Seed - a less gassy fiber than psyllium   No reading or other relaxing activity while on the toilet. If bowel movements take longer than 5 minutes, you are too constipated   AVOID CONSTIPATION.  High fiber and water intake usually takes care of this.  Sometimes a laxative is needed to stimulate more frequent bowel movements, but    Laxatives are not a good long-term solution as it can wear the colon out. o Osmotics (Milk of Magnesia, Fleets phosphosoda, Magnesium citrate, MiraLax, GoLytely) are safer than  o Stimulants (Senokot, Castor Oil, Dulcolax, Ex Lax)    o Do not take laxatives for more than 7days in a row.    IF SEVERELY CONSTIPATED, try a Bowel Retraining Program: o Do not use laxatives.  o Eat a diet high in roughage, such as bran cereals and leafy vegetables.  o Drink six (6) ounces of prune or apricot juice each morning.  o Eat two (2) large servings of stewed fruit each day.  o Take one (1) heaping tablespoon of a psyllium-based bulking agent twice a day. Use sugar-free sweetener when possible to avoid  excessive calories.  o Eat a normal breakfast.  o Set aside 15 minutes after breakfast to sit on the toilet, but do not strain to have a bowel movement.  o If you do not have a bowel movement by the third day, use an enema and repeat the above steps.    Controlling diarrhea o Switch to liquids and simpler foods for a few days to avoid stressing your intestines further. o Avoid dairy products (especially milk & ice cream) for a short time.  The intestines often can lose the ability to digest lactose when stressed. o Avoid foods that cause gassiness or bloating.  Typical foods include beans and other legumes, cabbage, broccoli, and dairy foods.  Every person has some sensitivity to other foods, so listen to our body and avoid those foods that trigger problems for you. o Adding fiber (Citrucel, Metamucil, psyllium, Miralax) gradually can help thicken stools by absorbing excess fluid and retrain the intestines to act more normally.  Slowly increase the dose over a few weeks.  Too much  fiber too soon can backfire and cause cramping & bloating. o Probiotics (such as active yogurt, Align, etc) may help repopulate the intestines and colon with normal bacteria and calm down a sensitive digestive tract.  Most studies show it to be of mild help, though, and such products can be costly. o Medicines:   Bismuth subsalicylate (ex. Kayopectate, Pepto Bismol) every 30 minutes for up to 6 doses can help control diarrhea.  Avoid if pregnant.   Loperamide (Immodium) can slow down diarrhea.  Start with two tablets (4mg  total) first and then try one tablet every 6 hours.  Avoid if you are having fevers or severe pain.  If you are not better or start feeling worse, stop all medicines and call your doctor for advice o Call your doctor if you are getting worse or not better.  Sometimes further testing (cultures, endoscopy, X-ray studies, bloodwork, etc) may be needed to help diagnose and treat the cause of the  diarrhea.  HEMORRHOIDS  The rectum is the last foot of your colon, and it naturally stretches to hold stool.  Hemorrhoidal piles are natural clusters of blood vessels that help the rectum and anal canal stretch to hold stool and allow bowel movements to eliminate feces.   Hemorrhoids are abnormally swollen blood vessels in the rectum.  Too much pressure in the rectum causes hemorrhoids by forcing blood to stretch and bulge the walls of the veins, sometimes even rupturing them.  Hemorrhoids can become like varicose veins you might see on a person's legs.  Most people will develop a flare of hemorrhoids in their lifetime.  When bulging hemorrhoidal veins are irritated, they can swell, burn, itch, cause pain, and bleed.  Most flares will calm down gradually own within a few weeks.  However, once hemorrhoids are created, they are difficult to get rid of completely and tend to flare more easily than the first flare.   Fortunately, good habits and simple medical treatment usually control hemorrhoids well, and surgery is needed only in severe cases. Types of Hemorrhoids:  Internal hemorrhoids usually don't initially hurt or itch; they are deep inside the rectum and usually have no sensation. If they begin to push out (prolapse), pain and burning can occur.  However, internal hemorrhoids can bleed.  Anal bleeding should not be ignored since bleeding could come from a dangerous source like colorectal cancer, so persistent rectal bleeding should be investigated by a doctor, sometimes with a colonoscopy.  External hemorrhoids cause most of the symptoms - pain, burning, and itching. Nonirritated hemorrhoids can look like small skin tags coming out of the anus.   Thrombosed hemorrhoids can form when a hemorrhoid blood vessel bursts and causes the hemorrhoid to suddenly swell.  A purple blood clot can form in it and become an excruciatingly painful lump at the anus. Because of these unpleasant symptoms, immediate  incision and drainage by a surgeon at an office visit can provide much relief of the pain.    PREVENTION Avoiding the most frequent causes listed below will prevent most cases of hemorrhoids: Constipation Hard stools Diarrhea  Constant sitting  Straining with bowel movements Sitting on the toilet for a long time  Severe coughing  episodes Pregnancy / Childbirth  Heavy Lifting  Sometimes avoiding the above triggers is difficult:  How can you avoid sitting all day if you have a seated job? Also, we try to avoid coughing and diarrhea, but sometimes it's beyond your control.  Still, there are some practical hints to  help: Keep the anal and genital area clean.  Moistened tissues such as flushable wet wipes are less irritating than toilet paper.  Using irrigating showers or bottle irrigation washing gently cleans this sensitive area.   Avoid dry toilet paper when cleaning after bowel movements.  Marland Kitchen Keep the anal and genital area dry.  Lightly pat the rectal area dry.  Avoid rubbing.  Talcum or baby powders can help GET YOUR STOOLS SOFT.   This is the most important way to prevent irritated hemorrhoids.  Hard stools are like sandpaper to the anorectal canal and will cause more problems.  The goal: ONE SOFT BOWEL MOVEMENT A DAY!  BMs from every other day to 3 times a day is a tolerable range Treat coughing, diarrhea and constipation early since irritated hemorrhoids may soon follow.  If your main job activity is seated, always stand or walk during your breaks. Make it a point to stand and walk at least 5 minutes every hour and try to shift frequently in your chair to avoid direct rectal pressure.  Always exhale as you strain or lift. Don't hold your breath.  Do not delay or try to prevent a bowel movement when the urge is present. Exercise regularly (walking or jogging 60 minutes a day) to stimulate the bowels to move. No reading or other activity while on the toilet. If bowel movements take longer than  5 minutes, you are too constipated. AVOID CONSTIPATION Drink plenty of liquids (1 1/2 to 2 quarts of water and other fluids a day unless fluid restricted for another medical condition). Liquids that contain caffeine (coffee a, tea, soft drinks) can be dehydrating and should be avoided until constipation is controlled. Consider minimizing milk, as dairy products may be constipating. Eat plenty of fiber (30g a day ideal, more if needed).  Fiber is the undigested part of plant food that passes into the colon, acting as "natures broom" to encourage bowel motility and movement.  Fiber can absorb and hold large amounts of water. This results in a larger, bulkier stool, which is soft and easier to pass.  Eating foods high in fiber - 12 servings - such as  Vegetables: Root (potatoes, carrots, turnips), Leafy green (lettuce, salad greens, celery, spinach), High residue (cabbage, broccoli, etc.) Fruit: Fresh, Dried (prunes, apricots, cherries), Stewed (applesauce)  Whole grain breads, pasta, whole wheat Bran cereals, muffins, etc. Consider adding supplemental bulking fiber which retains large volumes of water: Psyllium ground seeds --available as Metamucil, Konsyl, Effersyllium, Per Diem Fiber, or the less expensive generic forms.  Citrucel  (methylcellulose wood fiber) . FiberCon (Polycarbophil) Polyethylene Glycol - and "artificial" fiber commonly called Miralax or Glycolax.  It is helpful for people with gassy or bloated feelings with regular fiber Flax Seed - a less gassy natural fiber  Laxatives can be useful for a short period if constipation is severe Osmotics (Milk of Magnesia, Fleets Phospho-Soda, Magnesium Citrate)  Stimulants (Senokot,   Castor Oil,  Dulcolax, Ex-Lax)    Laxatives are not a good long-term solution as it can stress the bowels and cause too much mineral loss and dehydration.   Avoid taking laxatives for more than 7 days in a row.  AVOID DIARRHEA Switch to liquids and simpler  foods for a few days to avoid stressing your intestines further. Avoid dairy products (especially milk & ice cream) for a short time.  The intestines often can lose the ability to digest lactose when stressed. Avoid foods that cause gassiness or bloating.  Typical  foods include beans and other legumes, cabbage, broccoli, and dairy foods.  Every person has some sensitivity to other foods, so listen to your body and avoid those foods that trigger problems for you. Adding fiber (Citrucel, Metamucil, FiberCon, Flax seed, Miralax) gradually can help thicken stools by absorbing excess fluid and retrain the intestines to act more normally.  Slowly increase the dose over a few weeks.  Too much fiber too soon can backfire and cause cramping & bloating. Probiotics (such as active yogurt, Align, etc) may help repopulate the intestines and colon with normal bacteria and calm down a sensitive digestive tract.  Most studies show it to be of mild help, though, and such products can be costly. Medicines: Bismuth subsalicylate (ex. Kayopectate, Pepto Bismol) every 30 minutes for up to 6 doses can help control diarrhea.  Avoid if pregnant. Loperamide (Immodium) can slow down diarrhea.  Start with two tablets (4mg  total) first and then try one tablet every 6 hours.  Avoid if you are having fevers or severe pain.  If you are not better or start feeling worse, stop all medicines and call your doctor for advice Call your doctor if you are getting worse or not better.  Sometimes further testing (cultures, endoscopy, X-ray studies, bloodwork, etc) may be needed to help diagnose and treat the cause of the diarrhea. TREATMENT OF HEMORRHOID FLARE If these preventive measures fail, you must take action right away! Hemorrhoids are one condition that can be mild in the morning and become intolerable by nightfall. Most hemorrhoidal flares take several weeks to calm down.  These suggestions can help: Warm soaks.  This helps more than  any topical medication.  Use up to 8 times a day.  Usually sitz baths or sitting in a warm bathtub helps.  Sitting on moist warm towels are helpful.  Switching to ice packs/cool compresses can be helpful Normalize your bowels.  Extremes of diarrhea or constipation will make hemorrhoids worse.  One soft bowel movement a day is the goal.  Fiber can help get your bowels regular Wet wipes instead of toilet paper Pain control with a NSAID such as ibuprofen (Advil) or naproxen (Aleve) or acetaminophen (Tylenol) around the clock.  Narcotics are constipating and should be minimized if possible Topical creams contain steroids (bydrocortisone) or local anesthetic (xylocaine) can help make pain and itching more tolerable.   EVALUATION If hemorrhoids are still causing problems, you could benefit by an evaluation by a surgeon.  The surgeon will obtain a history and examine you.  If hemorrhoids are diagnosed, some therapies can be offered in the office, usually with an anoscope into the less sensitive area of the rectum: -injection of hemorrhoids (sclerotherapy) can scar the blood vessels of the swollen/enlarged hemorrhoids to help shrink them down to a more normal size -rubber banding of the enlarged hemorrhoids to help shrink them down to a more normal size -drainage of the blood clot causing a thrombosed hemorrhoid,  to relieve the severe pain   While 90% of the time such problems from hemorrhoids can be managed without preceding to surgery, sometimes the hemorrhoids require a operation to control the problem (uncontrolled bleeding, prolapse, pain, etc.).   This involves being placed under general anesthesia where the surgeon can confirm the diagnosis and remove, suture, or staple the hemorrhoid(s).  Your surgeon can help you treat the problem appropriately.

## 2013-03-09 ENCOUNTER — Ambulatory Visit (INDEPENDENT_AMBULATORY_CARE_PROVIDER_SITE_OTHER): Payer: Self-pay | Admitting: Surgery

## 2013-04-08 ENCOUNTER — Encounter (INDEPENDENT_AMBULATORY_CARE_PROVIDER_SITE_OTHER): Payer: Self-pay

## 2013-04-15 ENCOUNTER — Encounter (INDEPENDENT_AMBULATORY_CARE_PROVIDER_SITE_OTHER): Payer: Self-pay

## 2013-06-25 ENCOUNTER — Other Ambulatory Visit: Payer: Self-pay | Admitting: Family Medicine

## 2013-08-04 ENCOUNTER — Encounter (INDEPENDENT_AMBULATORY_CARE_PROVIDER_SITE_OTHER): Payer: Self-pay

## 2013-08-07 ENCOUNTER — Ambulatory Visit (INDEPENDENT_AMBULATORY_CARE_PROVIDER_SITE_OTHER): Payer: BC Managed Care – PPO | Admitting: Family Medicine

## 2013-08-07 ENCOUNTER — Encounter: Payer: Self-pay | Admitting: Family Medicine

## 2013-08-07 VITALS — BP 152/100 | HR 86 | Temp 98.0°F | Resp 20 | Ht 67.0 in | Wt 286.0 lb

## 2013-08-07 DIAGNOSIS — I10 Essential (primary) hypertension: Secondary | ICD-10-CM

## 2013-08-07 DIAGNOSIS — J329 Chronic sinusitis, unspecified: Secondary | ICD-10-CM

## 2013-08-07 MED ORDER — AMOXICILLIN 500 MG PO CAPS: 500.0000 mg | ORAL_CAPSULE | Freq: Three times a day (TID) | ORAL | Status: DC

## 2013-08-07 MED ORDER — LEVOCETIRIZINE DIHYDROCHLORIDE 5 MG PO TABS: 5.0000 mg | ORAL_TABLET | Freq: Every day | ORAL | Status: DC

## 2013-08-07 MED ORDER — AMLODIPINE BESYLATE 5 MG PO TABS: 5.0000 mg | ORAL_TABLET | Freq: Every day | ORAL | Status: DC

## 2013-08-07 NOTE — Progress Notes (Signed)
Subjective:    Patient ID: Kelly [name], female    DOB: [birth year], 52 y.o.   MRN: [id number]  HPI  Patient reports 3 days of a dull constant headache in the area of her frontal and ethmoid sinuses. She denies any fever. She denies any congestion. She denies any rhinorrhea, sore throat, or coughing. Her blood pressure is extremely elevated today 152/100. She denies any chest pain, shortness of breath, dyspnea on exertion. She has not been checking her blood pressure recently. Past Medical History  Diagnosis Date  . Obesity   . Hyperlipidemia   . GERD (gastroesophageal reflux disease)   . Depression   . Seasonal allergies   . IBS (irritable bowel syndrome)   . Headache disorder   . Salivary calculus   . Salivary calculus    Current Outpatient Prescriptions on File Prior to Visit  Medication Sig Dispense Refill  . ALPRAZolam (XANAX) 0.25 MG tablet Take 0.25 mg by mouth as needed.       . betamethasone valerate (VALISONE) 0.1 % cream       . fluticasone (FLONASE) 50 MCG/ACT nasal spray USE 2 SPRAYS IN EACH       NOSTRIL DAILY  48 g  3  . ibuprofen (ADVIL,MOTRIN) 800 MG tablet Take 800 mg by mouth as needed.       Marland Kitchen NEXIUM 40 MG capsule Take 40 mg by mouth daily.        No current facility-administered medications on file prior to visit.   Allergies  Allergen Reactions  . Codeine Nausea Only  . Topamax [Topiramate] Nausea Only  . Maxalt [Rizatriptan]    History   Social History  . Marital Status: Single    Spouse Name: N/A    Number of Children: N/A  . Years of Education: N/A   Occupational History  . Not on file.   Social History Main Topics  . Smoking status: Former Smoker -- 7 years    Types: Cigarettes  . Smokeless tobacco: Not on file     Comment: 1.5 pack a week  . Alcohol Use: No     Comment: Rare  . Drug Use: No  . Sexual Activity: Not on file   Other Topics Concern  . Not on file   Social History Narrative  . No narrative on file     Review of  Systems  All other systems reviewed and are negative.       Objective:   Physical Exam  Vitals reviewed. Constitutional: She is oriented to person, place, and time.  HENT:  Head: Normocephalic.  Right Ear: External ear normal.  Left Ear: External ear normal.  Nose: Nose normal.  Mouth/Throat: Oropharynx is clear and moist. No oropharyngeal exudate.  Eyes: Conjunctivae are normal. No scleral icterus.  Neck: Neck supple. No JVD present.  Cardiovascular: Normal rate, regular rhythm, normal heart sounds and intact distal pulses.   No murmur heard. Pulmonary/Chest: Effort normal and breath sounds normal. No respiratory distress. She has no wheezes. She has no rales. She exhibits no tenderness.  Lymphadenopathy:    She has no cervical adenopathy.  Neurological: She is alert and oriented to person, place, and time. She has normal reflexes. She displays normal reflexes. No cranial nerve deficit. She exhibits normal muscle tone. Coordination normal.          Assessment & Plan:  HTN (hypertension) - Plan: amLODipine (NORVASC) 5 MG tablet, DISCONTINUED: amLODipine (NORVASC) 5 MG tablet  Sinusitis - Plan: amoxicillin (AMOXIL)  500 MG capsule, DISCONTINUED: amoxicillin (AMOXIL) 500 MG capsule  Other than location, there are no other symptoms consistent with a sinus infection. I believe it may be due to her blood pressure or less likely a migraine. Begin amlodipine 5 mg by mouth daily. If she develops a fever or rhinorrhea or congestion I gave her a prescription for amoxicillin 500 mg by mouth 3 times a day for 10 days. If no better by Monday she is to come in and see me. If it is getting worse she should get an urgent care over the weekend.

## 2013-09-26 ENCOUNTER — Other Ambulatory Visit: Payer: Self-pay | Admitting: Family Medicine

## 2014-01-01 ENCOUNTER — Encounter: Payer: Self-pay | Admitting: Family Medicine

## 2014-01-01 ENCOUNTER — Ambulatory Visit
Admission: RE | Admit: 2014-01-01 | Discharge: 2014-01-01 | Disposition: A | Discharge status: Home / Self Care | Payer: BC Managed Care – PPO | Source: Ambulatory Visit | Attending: Family Medicine | Admitting: Family Medicine

## 2014-01-01 ENCOUNTER — Ambulatory Visit (INDEPENDENT_AMBULATORY_CARE_PROVIDER_SITE_OTHER): Payer: BC Managed Care – PPO | Admitting: Family Medicine

## 2014-01-01 VITALS — BP 140/94 | HR 84 | Temp 97.7°F | Resp 20 | Ht 67.0 in | Wt 286.0 lb

## 2014-01-01 DIAGNOSIS — M25562 Pain in left knee: Secondary | ICD-10-CM

## 2014-01-01 DIAGNOSIS — L219 Seborrheic dermatitis, unspecified: Secondary | ICD-10-CM

## 2014-01-01 DIAGNOSIS — M25569 Pain in unspecified knee: Secondary | ICD-10-CM

## 2014-01-01 DIAGNOSIS — L218 Other seborrheic dermatitis: Secondary | ICD-10-CM

## 2014-01-01 MED ORDER — CLOBETASOL PROPIONATE 0.05 % EX FOAM: Freq: Two times a day (BID) | CUTANEOUS | Status: DC

## 2014-01-01 MED ORDER — ETODOLAC 400 MG PO TABS: 400.0000 mg | ORAL_TABLET | Freq: Two times a day (BID) | ORAL | Status: DC

## 2014-01-01 NOTE — Progress Notes (Signed)
Subjective:    Patient ID: Kelly [name], female    DOB: 1961/07/11, 53 y.o.   MRN: [id number]  HPI Patient reports 2 weeks of left anterior medial knee pain and also pain under her patella. It is worse rising from a seated position and with prolonged walking or standing. She denies any specific injury. She denies any locking or laxity in the knee joint. She denies any erythema or effusion. It does tend to improve when she is off her knee for a few days. She also reports an itchy rash in her scalp. Past Medical History  Diagnosis Date  . Obesity   . Hyperlipidemia   . GERD (gastroesophageal reflux disease)   . Depression   . Seasonal allergies   . IBS (irritable bowel syndrome)   . Headache disorder   . Salivary calculus   . Salivary calculus    Current Outpatient Prescriptions on File Prior to Visit  Medication Sig Dispense Refill  . ALPRAZolam (XANAX) 0.25 MG tablet Take 0.25 mg by mouth as needed.       . betamethasone valerate (VALISONE) 0.1 % cream       . fluticasone (FLONASE) 50 MCG/ACT nasal spray USE 2 SPRAYS IN EACH       NOSTRIL DAILY  48 g  3  . ibuprofen (ADVIL,MOTRIN) 800 MG tablet Take 800 mg by mouth as needed.       Marland Kitchen levocetirizine (XYZAL) 5 MG tablet Take 1 tablet (5 mg total) by mouth daily.  90 tablet  3  . NEXIUM 40 MG capsule Take 40 mg by mouth daily.        No current facility-administered medications on file prior to visit.   Allergies  Allergen Reactions  . Codeine Nausea Only  . Topamax [Topiramate] Nausea Only  . Maxalt [Rizatriptan]    History   Social History  . Marital Status: Single    Spouse Name: N/A    Number of Children: N/A  . Years of Education: N/A   Occupational History  . Not on file.   Social History Main Topics  . Smoking status: Former Smoker -- 7 years    Types: Cigarettes  . Smokeless tobacco: Not on file     Comment: 1.5 pack a week  . Alcohol Use: No     Comment: Rare  . Drug Use: No  . Sexual Activity: Not on  file   Other Topics Concern  . Not on file   Social History Narrative  . No narrative on file      Review of Systems  All other systems reviewed and are negative.       Objective:   Physical Exam  Vitals reviewed. Cardiovascular: Normal rate, regular rhythm and normal heart sounds.   No murmur heard. Pulmonary/Chest: Effort normal and breath sounds normal. No respiratory distress. She has no wheezes. She has no rales.  Musculoskeletal:       Left knee: She exhibits decreased range of motion. She exhibits no swelling, no effusion, no LCL laxity, normal patellar mobility, normal meniscus and no MCL laxity. Tenderness found. Medial joint line tenderness noted. No lateral joint line, no MCL and no LCL tenderness noted.    Patient has thick seborrheic dermatitis on her right occiput, this is a thick plaque of yellow brown hyperkeratotic tissue      Assessment & Plan:  1. 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 I will obtain an x-ray of the left knee. Begin the  patient on lodine 400 mg by mouth twice a day.  Recheck in 2 weeks if no better - etodolac (LODINE) 400 MG tablet; Take 1 tablet (400 mg total) by mouth 2 (two) times daily.  Dispense: 60 tablet; Refill: 3 - DG Knee Complete 4 Views Left; Future  2. Seborrheic dermatitis of scalp - clobetasol (OLUX) 0.05 % topical foam; Apply topically 2 (two) times daily. Apply sparingly once daily to scalp as needed  Dispense: 50 g; Refill: 0

## 2014-02-07 ENCOUNTER — Other Ambulatory Visit: Payer: Self-pay | Admitting: Family Medicine

## 2014-02-09 ENCOUNTER — Other Ambulatory Visit: Payer: Self-pay | Admitting: Family Medicine

## 2014-02-12 ENCOUNTER — Encounter: Payer: Self-pay | Admitting: Family Medicine

## 2014-02-12 ENCOUNTER — Ambulatory Visit (INDEPENDENT_AMBULATORY_CARE_PROVIDER_SITE_OTHER): Payer: BC Managed Care – PPO | Admitting: Family Medicine

## 2014-02-12 VITALS — BP 142/98 | HR 100 | Temp 97.2°F | Resp 20 | Ht 67.0 in | Wt 279.0 lb

## 2014-02-12 DIAGNOSIS — J209 Acute bronchitis, unspecified: Secondary | ICD-10-CM

## 2014-02-12 DIAGNOSIS — L218 Other seborrheic dermatitis: Secondary | ICD-10-CM

## 2014-02-12 DIAGNOSIS — L219 Seborrheic dermatitis, unspecified: Secondary | ICD-10-CM

## 2014-02-12 MED ORDER — HYDROCODONE-HOMATROPINE 5-1.5 MG/5ML PO SYRP: 5.0000 mL | ORAL_SOLUTION | Freq: Three times a day (TID) | ORAL | Status: DC | PRN

## 2014-02-12 MED ORDER — CLOBETASOL PROPIONATE 0.05 % EX FOAM: Freq: Two times a day (BID) | CUTANEOUS | Status: DC

## 2014-02-12 MED ORDER — AZITHROMYCIN 250 MG PO TABS: ORAL_TABLET | ORAL | Status: DC

## 2014-02-12 NOTE — Progress Notes (Signed)
Subjective:    Patient ID: Kelly [name], female    DOB: 15-Dec-1960, 53 y.o.   MRN: [id number]  HPI Patient has had 5 days of nonproductive cough, chest congestion, tightness in the center of her chest and in her airways. She denies fever. She denies productive cough. She denies hemoptysis. She is having a sore throat and rhinorrhea. Past Medical History  Diagnosis Date  . Obesity   . Hyperlipidemia   . GERD (gastroesophageal reflux disease)   . Depression   . Seasonal allergies   . IBS (irritable bowel syndrome)   . Headache disorder   . Salivary calculus   . Salivary calculus    Current Outpatient Prescriptions on File Prior to Visit  Medication Sig Dispense Refill  . ALPRAZolam (XANAX) 0.25 MG tablet Take 0.25 mg by mouth as needed.       . betamethasone valerate (VALISONE) 0.1 % cream       . Cyanocobalamin (VITAMIN B12 PO) Take by mouth.      . etodolac (LODINE) 400 MG tablet Take 1 tablet (400 mg total) by mouth 2 (two) times daily.  60 tablet  3  . fluticasone (FLONASE) 50 MCG/ACT nasal spray USE 2 SPRAYS IN EACH       NOSTRIL DAILY  48 g  3  . ibuprofen (ADVIL,MOTRIN) 800 MG tablet Take 800 mg by mouth as needed.       Marland Kitchen levocetirizine (XYZAL) 5 MG tablet Take 1 tablet (5 mg total) by mouth daily.  90 tablet  3  . NEXIUM 40 MG capsule Take 40 mg by mouth daily.        No current facility-administered medications on file prior to visit.   Allergies  Allergen Reactions  . Codeine Nausea Only  . Topamax [Topiramate] Nausea Only  . Maxalt [Rizatriptan]    History   Social History  . Marital Status: Single    Spouse Name: N/A    Number of Children: N/A  . Years of Education: N/A   Occupational History  . Not on file.   Social History Main Topics  . Smoking status: Former Smoker -- 7 years    Types: Cigarettes  . Smokeless tobacco: Not on file     Comment: 1.5 pack a week  . Alcohol Use: No     Comment: Rare  . Drug Use: No  . Sexual Activity: Not on file     Other Topics Concern  . Not on file   Social History Narrative  . No narrative on file      Review of Systems  All other systems reviewed and are negative.      Objective:   Physical Exam  Vitals reviewed. Constitutional: She appears well-developed and well-nourished.  HENT:  Right Ear: External ear normal.  Left Ear: External ear normal.  Nose: Nose normal.  Mouth/Throat: Oropharynx is clear and moist. No oropharyngeal exudate.  Eyes: Conjunctivae are normal.  Neck: Neck supple.  Cardiovascular: Normal rate, regular rhythm and normal heart sounds.   No murmur heard. Pulmonary/Chest: Effort normal and breath sounds normal. No respiratory distress. She has no wheezes. She has no rales.  Lymphadenopathy:    She has no cervical adenopathy.          Assessment & Plan:  1. Acute bronchitis I explained to the patient I thought she had a viral bronchitis/upper respiratory infection. I recommended tincture of time. I recommended symptomatic relief with Vicodin caused a 1 teaspoon every 8 hours  as needed for cough. I did give the patient prescription for a Z-Pak with strict instructions not to feel unless symptoms worsen. I anticipate spontaneous resolution in 7 days. - azithromycin (ZITHROMAX) 250 MG tablet; 2 tabs poqday1, 1 tab poqday 2-5  Dispense: 6 tablet; Refill: 0 - HYDROcodone-homatropine (HYCODAN) 5-1.5 MG/5ML syrup; Take 5 mLs by mouth every 8 (eight) hours as needed for cough.  Dispense: 120 mL; Refill: 0  2. Seborrheic dermatitis of scalp I refilled the patient's prescription for clobetasol foam. - clobetasol (OLUX) 0.05 % topical foam; Apply topically 2 (two) times daily. Apply sparingly once daily to scalp as needed  Dispense: 50 g; Refill: 0

## 2014-03-06 IMAGING — CR DG KNEE COMPLETE 4+V*L*
4 series · 4 of 4 positions shown · non-contrast
Comparison: No priors.

CLINICAL DATA: Pain in the medial aspect of the left knee.

EXAM:
LEFT KNEE - COMPLETE 4+ VIEW

[view not recorded (1 of 4)]
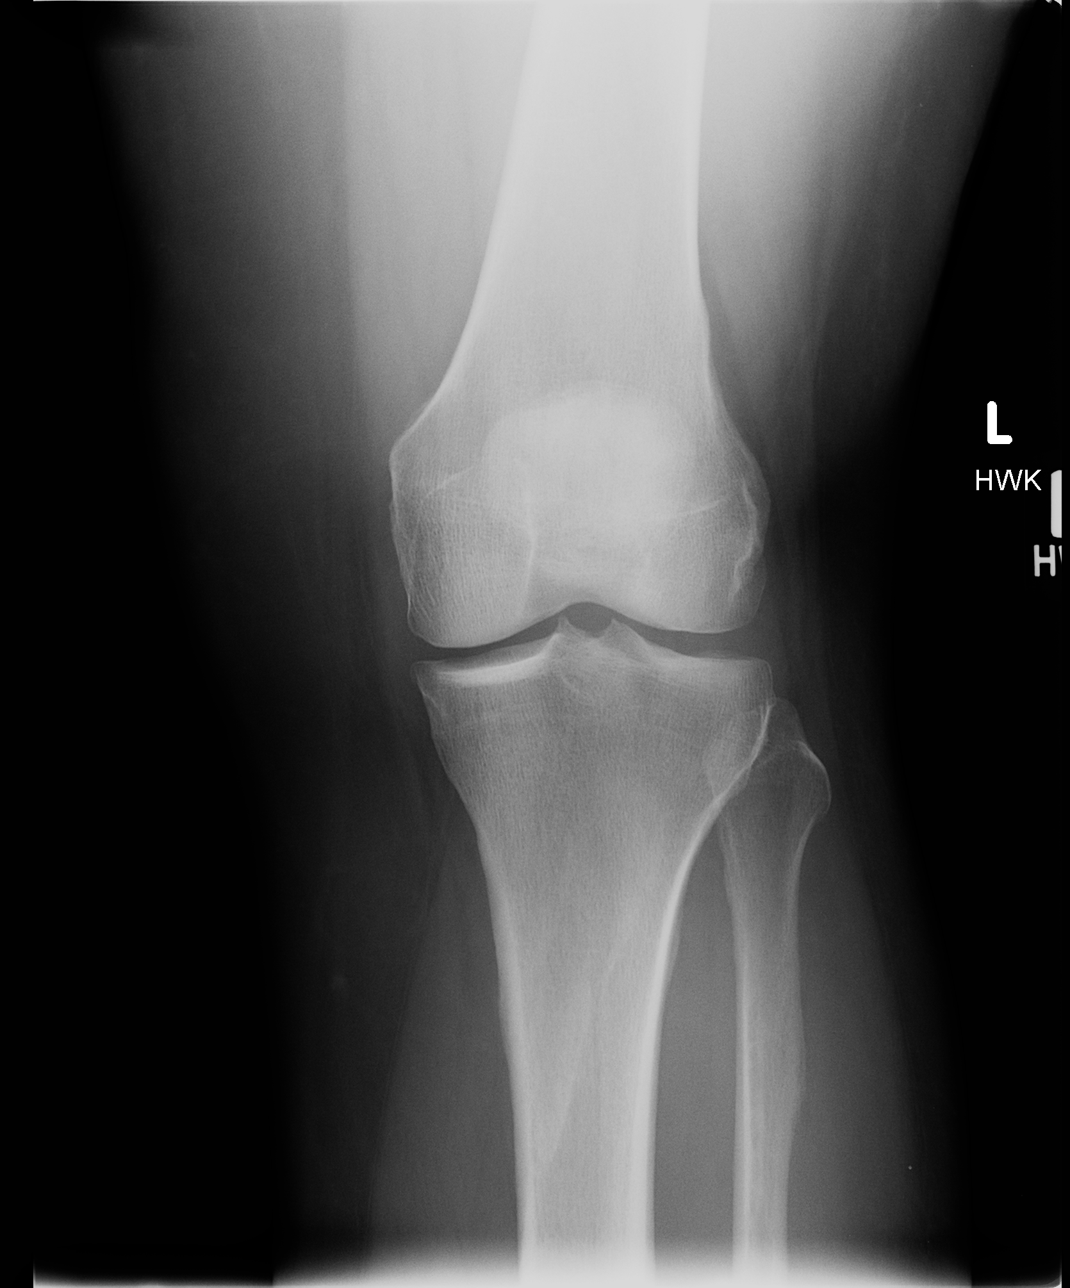

[view not recorded (2 of 4)]
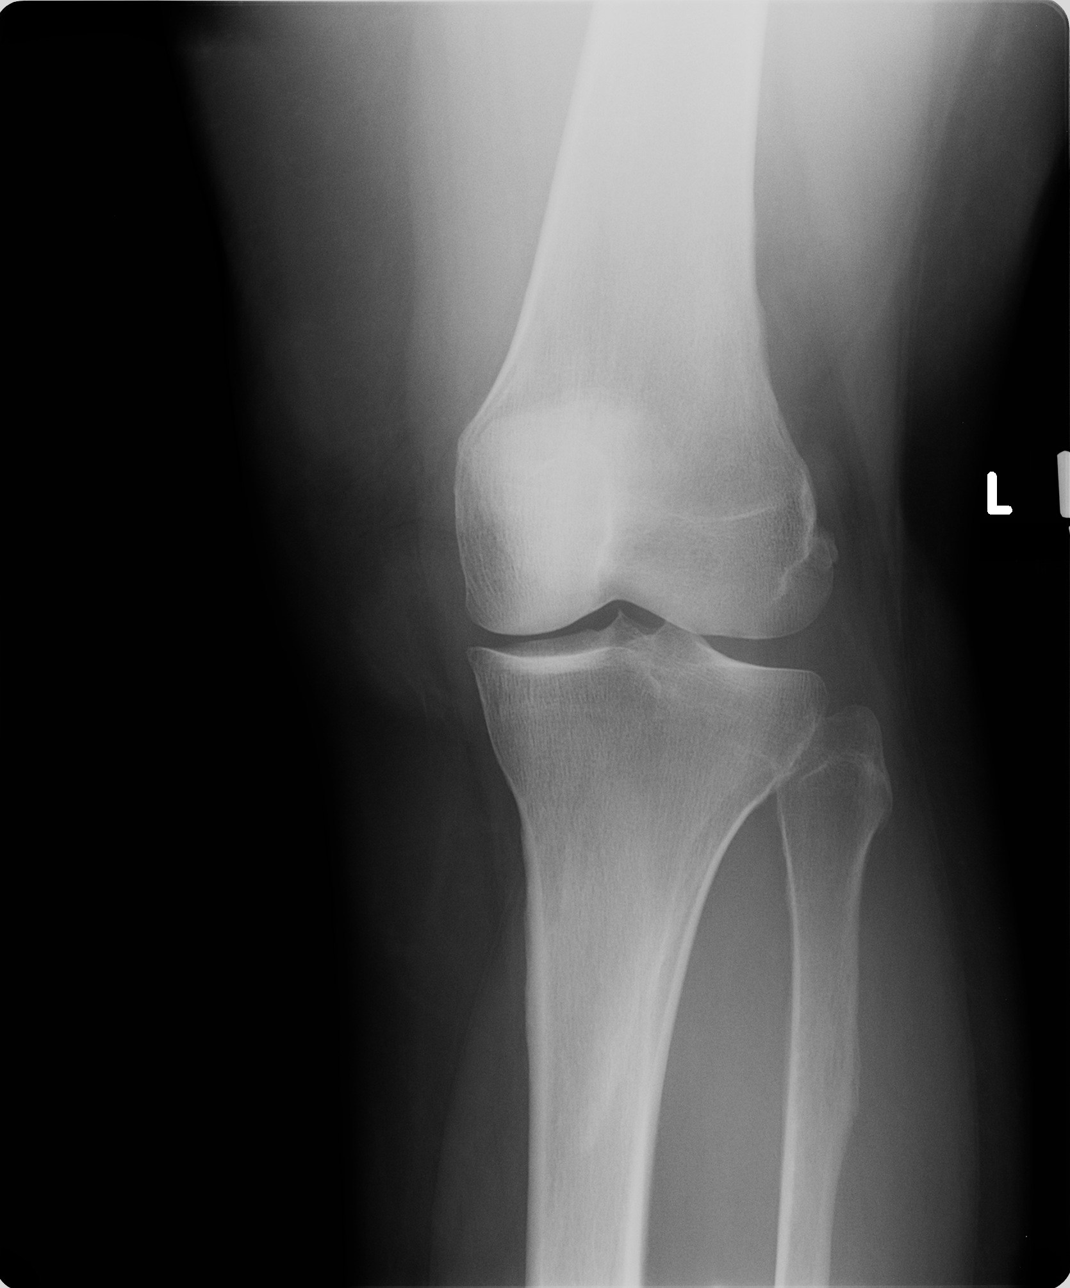

[view not recorded (3 of 4)]
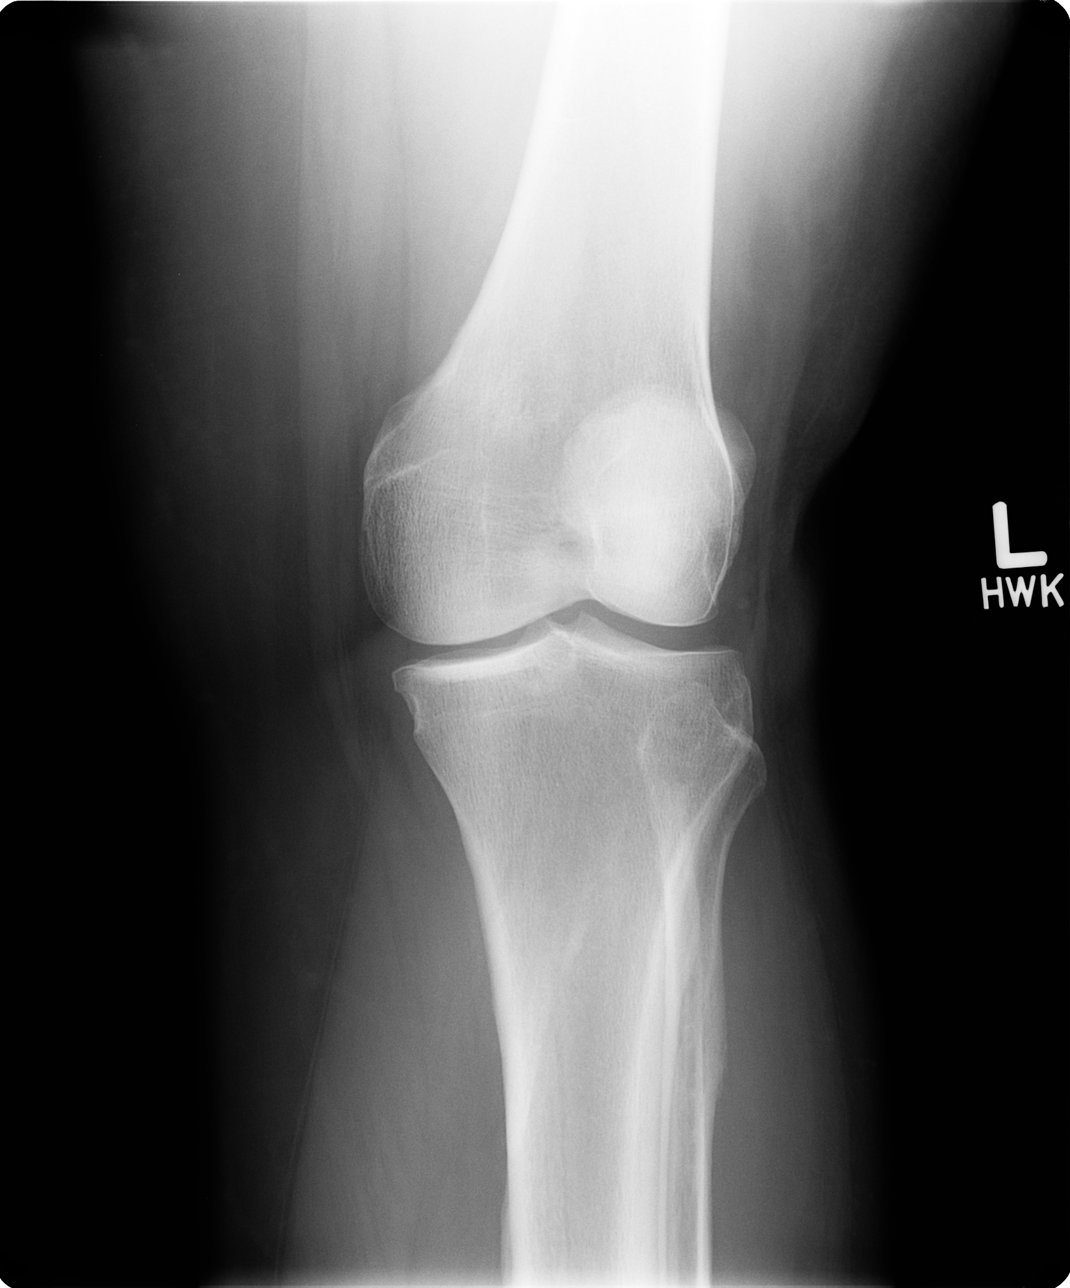

[view not recorded (4 of 4)]
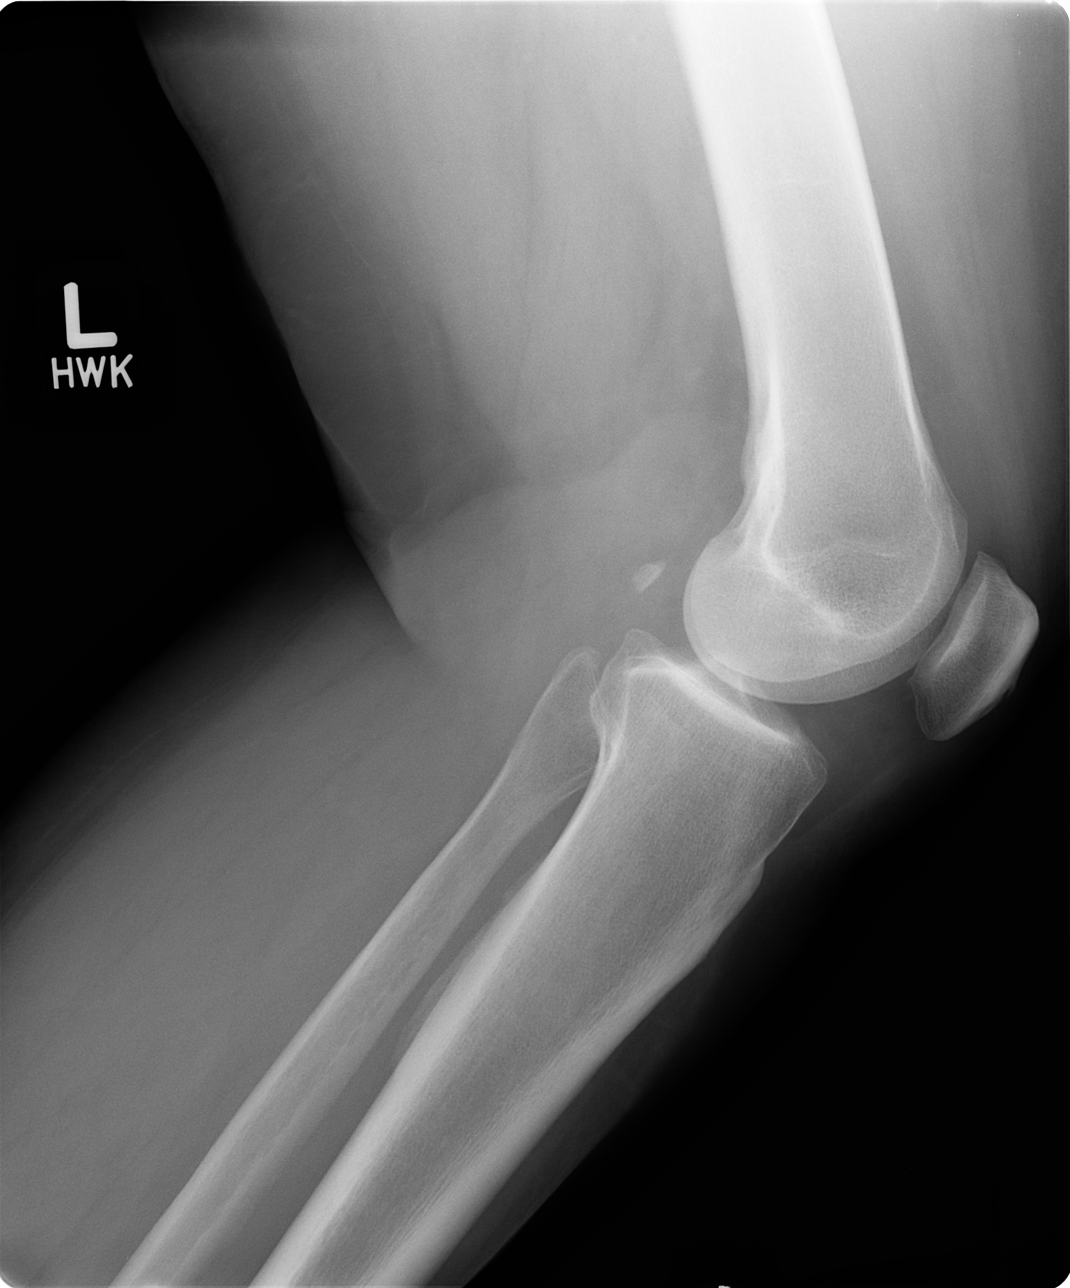

[4 of 4 positions shown; findings below may reference images not displayed]

FINDINGS: Multiple views of the left knee demonstrate no acute displaced
fracture, subluxation, dislocation, or soft tissue abnormality. Mild
joint space narrowing, subchondral sclerosis and osteophyte
formation, most pronounced in the medial and patellofemoral
compartments, compatible with mild osteoarthritis.
IMPRESSION: No acute radiographic abnormality of the left knee.

## 2014-06-01 ENCOUNTER — Ambulatory Visit (INDEPENDENT_AMBULATORY_CARE_PROVIDER_SITE_OTHER): Payer: BC Managed Care – PPO | Admitting: Family Medicine

## 2014-06-01 ENCOUNTER — Encounter: Payer: Self-pay | Admitting: Family Medicine

## 2014-06-01 VITALS — BP 130/88 | HR 84 | Temp 98.1°F | Resp 20 | Ht 67.0 in | Wt 286.0 lb

## 2014-06-01 DIAGNOSIS — L918 Other hypertrophic disorders of the skin: Secondary | ICD-10-CM

## 2014-06-01 DIAGNOSIS — G2581 Restless legs syndrome: Secondary | ICD-10-CM

## 2014-06-01 DIAGNOSIS — L919 Hypertrophic disorder of the skin, unspecified: Secondary | ICD-10-CM

## 2014-06-01 DIAGNOSIS — L909 Atrophic disorder of skin, unspecified: Secondary | ICD-10-CM

## 2014-06-01 MED ORDER — GABAPENTIN 300 MG PO CAPS: 300.0000 mg | ORAL_CAPSULE | Freq: Three times a day (TID) | ORAL | Status: DC

## 2014-06-01 NOTE — Progress Notes (Signed)
Subjective:    Patient ID: Kelly [name], female    DOB: [birth year], 53 y.o.   MRN: [id number]  HPI  Patient has mild numbness in her left great toe distal to the MTP joint. This began after she experienced pain while mowing and pressing on the clutch. She denies any pain in the toe. There is no erythema or swelling around the first MTP joint. Patient also experiences symptoms consistent with restless leg syndrome at night. She isn't palpable sensation it goes away whenever she moves her legs. This keeps her from sleeping at night. He also has a skin tag in left axilla but she requests treatment for. Past Medical History  Diagnosis Date  . Obesity   . Hyperlipidemia   . GERD (gastroesophageal reflux disease)   . Depression   . Seasonal allergies   . IBS (irritable bowel syndrome)   . Headache disorder   . Salivary calculus   . Salivary calculus    Current Outpatient Prescriptions on File Prior to Visit  Medication Sig Dispense Refill  . ALPRAZolam (XANAX) 0.25 MG tablet Take 0.25 mg by mouth as needed.       . betamethasone valerate (VALISONE) 0.1 % cream       . clobetasol (OLUX) 0.05 % topical foam Apply topically 2 (two) times daily. Apply sparingly once daily to scalp as needed  50 g  0  . Cyanocobalamin (VITAMIN B12 PO) Take by mouth.      . etodolac (LODINE) 400 MG tablet Take 1 tablet (400 mg total) by mouth 2 (two) times daily.  60 tablet  3  . fluticasone (FLONASE) 50 MCG/ACT nasal spray USE 2 SPRAYS IN EACH       NOSTRIL DAILY  48 g  3  . ibuprofen (ADVIL,MOTRIN) 800 MG tablet Take 800 mg by mouth as needed.       Marland Kitchen levocetirizine (XYZAL) 5 MG tablet Take 1 tablet (5 mg total) by mouth daily.  90 tablet  3  . NEXIUM 40 MG capsule Take 40 mg by mouth daily.        No current facility-administered medications on file prior to visit.   Allergies  Allergen Reactions  . Codeine Nausea Only  . Topamax [Topiramate] Nausea Only  . Maxalt [Rizatriptan]    History   Social  History  . Marital Status: Single    Spouse Name: N/A    Number of Children: N/A  . Years of Education: N/A   Occupational History  . Not on file.   Social History Main Topics  . Smoking status: Former Smoker -- 7 years    Types: Cigarettes  . Smokeless tobacco: Not on file     Comment: 1.5 pack a week  . Alcohol Use: No     Comment: Rare  . Drug Use: No  . Sexual Activity: Not on file   Other Topics Concern  . Not on file   Social History Narrative  . No narrative on file     Review of Systems  All other systems reviewed and are negative.      Objective:   Physical Exam  Vitals reviewed. Constitutional: She is oriented to person, place, and time.  Cardiovascular: Normal rate and regular rhythm.   Pulmonary/Chest: Effort normal and breath sounds normal.  Musculoskeletal: Normal range of motion. She exhibits no edema and no tenderness.  Neurological: She is alert and oriented to person, place, and time. She has normal reflexes. She displays normal reflexes.  No cranial nerve deficit. She exhibits normal muscle tone. Coordination normal.          Assessment & Plan:  1. RLS (restless legs syndrome)  Begin gabapentin 300 mg by mouth 3 times a day when necessary restless leg syndrome pain - gabapentin (NEURONTIN) 300 MG capsule; Take 1 capsule (300 mg total) by mouth 3 (three) times daily.  Dispense: 90 capsule; Refill: 3  2. Skin tag Left axillary skin tag was treated with 30 seconds of cryotherapy using liquid nitrogen.  Numbness in the left great toe is likely due to a mild injury to the interdigital nerve. I anticipate spontaneous resolution. Consider B12 TSH and hemoglobin A1c if persistent

## 2014-06-04 ENCOUNTER — Telehealth: Payer: Self-pay | Admitting: Family Medicine

## 2014-06-04 NOTE — Telephone Encounter (Signed)
Patient would like to know if we could switch her acid reflux medication to 90 day supply to omeprazole 40 mg if possible  [phone] I asked her what [facility] and [facility] and then she said [facility] order? I did not see any thing about mail order as far as her pharmacy info

## 2014-06-07 MED ORDER — ESOMEPRAZOLE MAGNESIUM 40 MG PO CPDR
40.0000 mg | DELAYED_RELEASE_CAPSULE | Freq: Every day | ORAL | Status: DC
Start: 2014-06-07 — End: 2015-09-17

## 2014-06-07 NOTE — Telephone Encounter (Signed)
Med sent to mail order 

## 2014-06-20 ENCOUNTER — Other Ambulatory Visit: Payer: Self-pay | Admitting: Family Medicine

## 2014-06-21 NOTE — Telephone Encounter (Signed)
Refill appropriate and filled per protocol. 

## 2014-06-24 ENCOUNTER — Telehealth: Payer: Self-pay | Admitting: Family Medicine

## 2014-06-24 DIAGNOSIS — M25562 Pain in left knee: Secondary | ICD-10-CM

## 2014-06-24 NOTE — Telephone Encounter (Signed)
Patient is calling to request 90 day supply of etodolac instead of 1 month supply she says it is cheaper [phone]

## 2014-06-25 MED ORDER — ETODOLAC 400 MG PO TABS: 400.0000 mg | ORAL_TABLET | Freq: Two times a day (BID) | ORAL | Status: DC

## 2014-06-25 NOTE — Telephone Encounter (Signed)
Med sent to pharm 

## 2014-06-30 ENCOUNTER — Telehealth: Payer: Self-pay | Admitting: Family Medicine

## 2014-06-30 DIAGNOSIS — G2581 Restless legs syndrome: Secondary | ICD-10-CM

## 2014-06-30 DIAGNOSIS — M25562 Pain in left knee: Secondary | ICD-10-CM

## 2014-06-30 NOTE — Telephone Encounter (Signed)
The medications etodolac and the med for her restless leg syndrome needs to be called in as a 90 day supply.   [phone]   Pharmacy [facility] [address]

## 2014-07-01 MED ORDER — GABAPENTIN 300 MG PO CAPS: 300.0000 mg | ORAL_CAPSULE | Freq: Three times a day (TID) | ORAL | Status: DC

## 2014-07-01 MED ORDER — ETODOLAC 400 MG PO TABS: 400.0000 mg | ORAL_TABLET | Freq: Two times a day (BID) | ORAL | Status: DC

## 2014-07-01 NOTE — Telephone Encounter (Signed)
Med sent to pharm and pt aware 

## 2014-07-01 NOTE — Telephone Encounter (Signed)
[facility] - need name of RLS med

## 2014-07-25 ENCOUNTER — Other Ambulatory Visit: Payer: Self-pay | Admitting: Family Medicine

## 2014-07-26 NOTE — Telephone Encounter (Signed)
Medication refilled per protocol. 

## 2014-08-09 ENCOUNTER — Telehealth: Payer: Self-pay | Admitting: Family Medicine

## 2014-10-21 ENCOUNTER — Encounter: Payer: Self-pay | Admitting: Family Medicine

## 2014-10-21 ENCOUNTER — Ambulatory Visit (INDEPENDENT_AMBULATORY_CARE_PROVIDER_SITE_OTHER): Payer: BC Managed Care – PPO | Admitting: Family Medicine

## 2014-10-21 VITALS — BP 128/80 | HR 82 | Temp 98.0°F | Resp 20 | Ht 67.0 in | Wt 293.0 lb

## 2014-10-21 DIAGNOSIS — J019 Acute sinusitis, unspecified: Secondary | ICD-10-CM

## 2014-10-21 MED ORDER — AMOXICILLIN-POT CLAVULANATE 875-125 MG PO TABS: 1.0000 | ORAL_TABLET | Freq: Two times a day (BID) | ORAL | Status: DC

## 2014-10-21 NOTE — Progress Notes (Signed)
Subjective:    Patient ID: Kelly [name], female    DOB: 03/27/1961, 53 y.o.   MRN: [id number]  HPI  Patient reports 4-5 days of sinus congestion, postnasal drip, head congestion, scratchy throat. She now has a cough which is nonproductive and a low-grade fever. She does have some mild headache and head pressure she also occasionally reports otalgia. Past Medical History  Diagnosis Date  . Obesity   . Hyperlipidemia   . GERD (gastroesophageal reflux disease)   . Depression   . Seasonal allergies   . IBS (irritable bowel syndrome)   . Headache disorder   . Salivary calculus   . Salivary calculus    Past Surgical History  Procedure Laterality Date  . Temporomandibular joint surgery      3 times  . Abdominal hysterectomy  2001    partial hysterectomy  . Salivary gland surgery     Current Outpatient Prescriptions on File Prior to Visit  Medication Sig Dispense Refill  . ALPRAZolam (XANAX) 0.25 MG tablet Take 0.25 mg by mouth as needed.     . betamethasone valerate (VALISONE) 0.1 % cream     . clobetasol (OLUX) 0.05 % topical foam Apply topically 2 (two) times daily. Apply sparingly once daily to scalp as needed 50 g 0  . Cyanocobalamin (VITAMIN B12 PO) Take by mouth.    . esomeprazole (NEXIUM) 40 MG capsule Take 1 capsule (40 mg total) by mouth daily. 90 capsule 3  . etodolac (LODINE) 400 MG tablet Take 1 tablet (400 mg total) by mouth 2 (two) times daily. 180 tablet 3  . fluticasone (FLONASE) 50 MCG/ACT nasal spray USE 2 SPRAYS IN EACH       NOSTRIL DAILY 48 g 3  . gabapentin (NEURONTIN) 300 MG capsule Take 1 capsule (300 mg total) by mouth 3 (three) times daily. 270 capsule 3  . ibuprofen (ADVIL,MOTRIN) 800 MG tablet Take 800 mg by mouth as needed.     Marland Kitchen levocetirizine (XYZAL) 5 MG tablet TAKE 1 TABLET DAILY 90 tablet 3   No current facility-administered medications on file prior to visit.   Allergies  Allergen Reactions  . Codeine Nausea Only  . Topamax [Topiramate]  Nausea Only  . Maxalt [Rizatriptan]    History   Social History  . Marital Status: Single    Spouse Name: N/A    Number of Children: N/A  . Years of Education: N/A   Occupational History  . Not on file.   Social History Main Topics  . Smoking status: Former Smoker -- 7 years    Types: Cigarettes  . Smokeless tobacco: Not on file     Comment: 1.5 pack a week  . Alcohol Use: No     Comment: Rare  . Drug Use: No  . Sexual Activity: Not on file   Other Topics Concern  . Not on file   Social History Narrative     Review of Systems  All other systems reviewed and are negative.      Objective:   Physical Exam  Constitutional: She appears well-developed and well-nourished.  HENT:  Right Ear: External ear normal.  Left Ear: External ear normal.  Nose: Mucosal edema and rhinorrhea present. Right sinus exhibits no maxillary sinus tenderness and no frontal sinus tenderness. Left sinus exhibits no maxillary sinus tenderness and no frontal sinus tenderness.  Mouth/Throat: Oropharynx is clear and moist. No oropharyngeal exudate.  Eyes: Conjunctivae are normal.  Neck: Neck supple.  Cardiovascular: Normal rate,  regular rhythm and normal heart sounds.   Pulmonary/Chest: Effort normal and breath sounds normal.  Lymphadenopathy:    She has no cervical adenopathy.  Vitals reviewed.         Assessment & Plan:  Acute rhinosinusitis - Plan: amoxicillin-clavulanate (AUGMENTIN) 875-125 MG per tablet  Patient has a sinus infection. At the time I believe it is viral. I have recommended Sudafed, Afrin, and nasal saline 4 times a day. If symptoms worsen or if they are no better in 3-4 days begin Augmentin.

## 2014-11-03 ENCOUNTER — Ambulatory Visit (INDEPENDENT_AMBULATORY_CARE_PROVIDER_SITE_OTHER): Payer: BC Managed Care – PPO | Admitting: Physician Assistant

## 2014-11-03 VITALS — BP 168/106 | HR 92 | Temp 97.8°F | Resp 20 | Ht 67.5 in | Wt 301.8 lb

## 2014-11-03 DIAGNOSIS — J019 Acute sinusitis, unspecified: Secondary | ICD-10-CM

## 2014-11-03 MED ORDER — IPRATROPIUM BROMIDE 0.03 % NA SOLN: 2.0000 | Freq: Two times a day (BID) | NASAL | Status: DC

## 2014-11-03 MED ORDER — FLUCONAZOLE 150 MG PO TABS: 150.0000 mg | ORAL_TABLET | Freq: Once | ORAL | Status: DC

## 2014-11-03 MED ORDER — AMOXICILLIN-POT CLAVULANATE 400-57 MG/5ML PO SUSR: 1000.0000 mg | Freq: Two times a day (BID) | ORAL | Status: AC

## 2014-11-03 NOTE — Progress Notes (Signed)
Blood pressure was 168/106. I wanted to be sure we arrange follow-up for her blood pressure. Thanks

## 2014-11-03 NOTE — Patient Instructions (Signed)
Get plenty of rest and drink at least 64 ounces of water daily. 

## 2014-11-03 NOTE — Progress Notes (Signed)
Subjective:    Patient ID: Kelly [name], female    DOB: 1961/08/09, 53 y.o.   MRN: [id number]   PCP: Odette Fraction, MD  Chief Complaint  Patient presents with  . Nasal Congestion    x3 weeks- worsened yesterday  . Cough    x3 weeks- non-productive   . Facial Pain    sinus pain x3 weeks   . Wheezing    Constant x3 weeks     Allergies  Allergen Reactions  . Codeine Nausea Only  . Topamax [Topiramate] Nausea Only  . Maxalt [Rizatriptan] Nausea Only    Patient Active Problem List   Diagnosis Date Noted  . 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 Hemorrhoids, internal 03/04/2013  . External hemorrhoids with pain 03/04/2013  . Depression   . Seasonal allergies   . Irritable bowel syndrome with constipation >> diarrhea   . Headache disorder   . GERD (gastroesophageal reflux disease) 01/28/2013  . Chest pain 08/08/2012  . Hyperlipidemia 08/08/2012  . Obesity, Class III, BMI 40-49.9 (morbid obesity) 08/08/2012    Prior to Admission medications   Medication Sig Start Date End Date Taking? Authorizing Provider  ALPRAZolam (XANAX) 0.25 MG tablet Take 0.25 mg by mouth as needed.  06/01/12  Yes Historical Provider, MD  betamethasone valerate (VALISONE) 0.1 % cream  11/28/12  Yes Historical Provider, MD  clobetasol (OLUX) 0.05 % topical foam Apply topically 2 (two) times daily. Apply sparingly once daily to scalp as needed 02/12/14  Yes Susy Frizzle, MD  Cyanocobalamin (VITAMIN B12 PO) Take by mouth.   Yes Historical Provider, MD  esomeprazole (NEXIUM) 40 MG capsule Take 1 capsule (40 mg total) by mouth daily. 06/07/14  Yes Susy Frizzle, MD  etodolac (LODINE) 400 MG tablet Take 1 tablet (400 mg total) by mouth 2 (two) times daily. 07/01/14  Yes Susy Frizzle, MD  fluticasone [facility]) 50 MCG/ACT nasal spray USE 2 SPRAYS IN [facility]       NOSTRIL DAILY 06/21/14  Yes Susy Frizzle, MD  gabapentin (NEURONTIN) 300 MG capsule Take 1 capsule (300 mg total) by  mouth 3 (three) times daily. 07/01/14  Yes Susy Frizzle, MD  ibuprofen (ADVIL,MOTRIN) 800 MG tablet Take 800 mg by mouth as needed.  07/11/12  Yes Historical Provider, MD  levocetirizine (XYZAL) 5 MG tablet TAKE 1 TABLET DAILY 07/26/14  Yes Susy Frizzle, MD    Medical, Surgical, Family and Social History reviewed and updated.  HPI  Presents with 3+ weeks illness. Nasal/sinus congestion, non-productive cough, "not being able to breathe through my nose is making me short of breath." Saw her PCP 2 weeks ago. She was advised to use OTC mucolytic and decongestant for a viral sinusitis. She was prescribed Augmentin, but did not take it over fear that she would develop a yeast infection. She thought she was improving, but then worsened again yesterday. OTC Delsym isn't helping. Thinks that getting emotional and upset in the car on the way here contributes to her elevated BP today.  No fever, chills, nausea/vomiting. Received her flu vaccine in late October.  Review of Systems As above.    Objective:   Physical Exam  Constitutional: She is oriented to person, place, and time. Vital signs are normal. She appears well-developed and well-nourished. She is active and cooperative. No distress.  HENT:  Head: Normocephalic and atraumatic.  Right Ear: Hearing, tympanic membrane, external ear and ear canal normal.  Left Ear: Hearing, tympanic membrane,  external ear and ear canal normal.  Nose: Mucosal edema and rhinorrhea present.  No foreign bodies. Right sinus exhibits no maxillary sinus tenderness and no frontal sinus tenderness. Left sinus exhibits no maxillary sinus tenderness and no frontal sinus tenderness.  Mouth/Throat: Uvula is midline, oropharynx is clear and moist and mucous membranes are normal. No uvula swelling. No oropharyngeal exudate.  Eyes: Conjunctivae and EOM are normal. Pupils are equal, round, and reactive to light. Right eye exhibits no discharge. Left eye exhibits no  discharge. No scleral icterus.  Neck: Trachea normal, normal range of motion and full passive range of motion without pain. Neck supple. No thyroid mass and no thyromegaly present.  Cardiovascular: Normal rate, regular rhythm and normal heart sounds.   Pulmonary/Chest: Effort normal and breath sounds normal.  Lymphadenopathy:       Head (right side): No submandibular, no tonsillar, no preauricular, no posterior auricular and no occipital adenopathy present.       Head (left side): No submandibular, no tonsillar, no preauricular and no occipital adenopathy present.    She has no cervical adenopathy.       Right: No supraclavicular adenopathy present.       Left: No supraclavicular adenopathy present.  Neurological: She is alert and oriented to person, place, and time. She has normal strength. No cranial nerve deficit or sensory deficit.  Skin: Skin is warm, dry and intact. No rash noted.  Psychiatric: She has a normal mood and affect. Her speech is normal and behavior is normal.  Vitals reviewed.         Assessment & Plan:  1. Subacute sinusitis, unspecified location Start Augmentin (she requests a liquid instead of tablets). Follow with fluconazole if develops symptoms of yeast vaginitis. Add Atrovent nasal spray to her current regimen while her symptoms are active, and D/C oral decongestant products. Continue Delsym as needed for cough. - ipratropium (ATROVENT) 0.03 % nasal spray; Place 2 sprays into both nostrils 2 (two) times daily.  Dispense: 30 mL; Refill: 0 - amoxicillin-clavulanate (AUGMENTIN) 400-57 MG/5ML suspension; Take 12.5 mLs (1,000 mg total) by mouth 2 (two) times daily.  Dispense: 250 mL; Refill: 0 - fluconazole (DIFLUCAN) 150 MG tablet; Take 1 tablet (150 mg total) by mouth once. Repeat if needed  Dispense: 2 tablet; Refill: 0   Fara Chute, PA-C Physician Assistant-Certified [facility]

## 2014-11-08 ENCOUNTER — Telehealth: Payer: Self-pay | Admitting: Physician Assistant

## 2014-11-08 NOTE — Telephone Encounter (Signed)
Please call this patient. When she was here, her BP was quite elevated. I intended to recheck it before she left, but did not.  Does she have a way of checking her BP? If not, please advise her to return here or to see her PCP for recheck.

## 2014-11-09 NOTE — Telephone Encounter (Signed)
Patient says she has a blood pressure cuff at her home. Her BP has returned to normal

## 2014-11-09 NOTE — Telephone Encounter (Signed)
LM for pt to rtn to clinic or PCP for a BP check due to elevated reading in the office.

## 2014-11-10 NOTE — Telephone Encounter (Signed)
FYI

## 2014-12-23 NOTE — Progress Notes (Signed)
HPI: FU chest pain. Patient had a stress echocardiogram in November 2013. The study was normal. Since last seen, she has again developed occasional chest pain. It is in various locations on her chest. Lasts seconds. Not pleuritic, positional, exertional or related to food. No radiation. No associated symptoms. She is concerned about the symptoms that she has a family history of coronary disease.  Current Outpatient Prescriptions  Medication Sig Dispense Refill  . ALPRAZolam (XANAX) 0.25 MG tablet Take 0.25 mg by mouth as needed.     . betamethasone valerate (VALISONE) 0.1 % cream     . clobetasol (OLUX) 0.05 % topical foam Apply topically 2 (two) times daily. Apply sparingly once daily to scalp as needed 50 g 0  . Cyanocobalamin (VITAMIN B12 PO) Take by mouth.    . esomeprazole (NEXIUM) 40 MG capsule Take 1 capsule (40 mg total) by mouth daily. 90 capsule 3  . etodolac (LODINE) 400 MG tablet Take 1 tablet (400 mg total) by mouth 2 (two) times daily. 180 tablet 3  . fluticasone (FLONASE) 50 MCG/ACT nasal spray USE 2 SPRAYS IN EACH       NOSTRIL DAILY 48 g 3  . gabapentin (NEURONTIN) 300 MG capsule Take 1 capsule (300 mg total) by mouth 3 (three) times daily. 270 capsule 3  . ibuprofen (ADVIL,MOTRIN) 800 MG tablet Take 800 mg by mouth as needed.     Marland Kitchen levocetirizine (XYZAL) 5 MG tablet TAKE 1 TABLET DAILY 90 tablet 3   No current facility-administered medications for this visit.     Past Medical History  Diagnosis Date  . Obesity   . Hyperlipidemia   . GERD (gastroesophageal reflux disease)   . Depression   . Seasonal allergies   . IBS (irritable bowel syndrome)   . Headache disorder   . Salivary calculus   . Salivary calculus   . Allergy   . Anxiety     Past Surgical History  Procedure Laterality Date  . Temporomandibular joint surgery      3 times  . Abdominal hysterectomy  2001    partial hysterectomy  . Salivary gland surgery      History   Social History  .  Marital Status: Single    Spouse Name: n/a  . Number of Children: 0  . Years of Education: Associates   Occupational History  . 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 Collector     Day Surgery   Social History Main Topics  . Smoking status: Former Smoker -- 7 years    Types: Cigarettes  . Smokeless tobacco: Never Used     Comment: 1.5 pack a week  . Alcohol Use: 0.0 oz/week    0 Standard drinks or equivalent per week     Comment: Very seldom  . Drug Use: No  . Sexual Activity: Not on file   Other Topics Concern  . Not on file   Social History Narrative   Lives with her fiance. He has children.   Raised a niece after her brother died, she is now adult.    ROS: no fevers or chills, productive cough, hemoptysis, dysphasia, odynophagia, melena, hematochezia, dysuria, hematuria, rash, seizure activity, orthopnea, PND, pedal edema, claudication. Remaining systems are negative.  Physical Exam: Well-developed obese in no acute distress.  Skin is warm and dry.  HEENT is normal.  Neck is supple.  Chest is clear to auscultation with normal expansion.  Cardiovascular exam is  regular rate and rhythm.  Abdominal exam nontender or distended. No masses palpated. Extremities show no edema. neuro grossly intact  ECG sinus rhythm at a rate of 85. Nonspecific ST changes.

## 2014-12-24 ENCOUNTER — Ambulatory Visit (INDEPENDENT_AMBULATORY_CARE_PROVIDER_SITE_OTHER): Payer: BLUE CROSS/BLUE SHIELD | Admitting: Cardiology

## 2014-12-24 ENCOUNTER — Encounter: Payer: Self-pay | Admitting: Cardiology

## 2014-12-24 VITALS — BP 136/80 | HR 85 | Ht 67.0 in | Wt 299.4 lb

## 2014-12-24 DIAGNOSIS — R072 Precordial pain: Secondary | ICD-10-CM

## 2014-12-24 DIAGNOSIS — E785 Hyperlipidemia, unspecified: Secondary | ICD-10-CM

## 2014-12-24 NOTE — Assessment & Plan Note (Signed)
Symptoms are atypical.she is extremely concerned about these given her family history. She is also contemplating starting an exercise program. Plan stress echocardiogram for risk stratification. Imaging added given baseline ST changes.

## 2014-12-24 NOTE — Assessment & Plan Note (Signed)
Management per primary care. 

## 2014-12-24 NOTE — Assessment & Plan Note (Signed)
We discussed need for weight loss and exercise.

## 2014-12-24 NOTE — Patient Instructions (Signed)
Your physician recommends that you schedule a follow-up appointment in: AS NEEDED PENDING TEST RESULTS  Your physician has requested that you have a stress echocardiogram. For further information please visit [email or web address]. Please follow instruction sheet as given.    Exercise Stress Echocardiogram An exercise stress echocardiogram is a heart (cardiac) test used to check the function of your heart. This test may also be called an exercise stress echocardiography or stress echo. This stress test will check how well your heart muscle and valves are working and determine if your heart muscle is getting enough blood. You will exercise on a treadmill to naturally increase or stress the functioning of your heart.  An echocardiogram uses sound waves (ultrasound) to produce an image of your heart. If your heart does not work normally, it may indicate coronary artery disease with poor coronary blood supply. The coronary arteries are the arteries that bring blood and oxygen to your heart. LET [facility] CARE PROVIDER KNOW ABOUT:  Any allergies you have.  All medicines you are taking, including vitamins, herbs, eye drops, creams, and over-the-counter medicines.  Previous problems you or members of your family have had with the use of anesthetics.  Any blood disorders you have.  Previous surgeries you have had.  Medical conditions you have.  Possibility of pregnancy, if this applies. RISKS AND COMPLICATIONS Generally, this is a safe procedure. However, as with any procedure, complications can occur. Possible complications can include:  You develop pain or pressure in the following areas:  Chest.  Jaw or neck.  Between your shoulder blades.  Radiating down your left arm.  Dizziness or lightheadedness.  Shortness of breath.  Increased or irregular heartbeat.  Nausea or vomiting.  Heart attack (rare). BEFORE THE PROCEDURE  Avoid all forms of caffeine for 24 hours before your  test or as directed by your health care provider. This includes coffee, tea (even decaffeinated tea), caffeinated sodas, chocolate, cocoa, and certain pain medicines.  Follow your health care provider's instructions regarding eating and drinking before the test.  Take your medicines as directed at regular times with water unless instructed otherwise. Exceptions may include:  If you have diabetes, ask how you are to take your insulin or pills. It is common to adjust insulin dosing the morning of the test.  If you are taking beta-blocker medicines, it is important to talk to your health care provider about these medicines well before the date of your test. Taking beta-blocker medicines may interfere with the test. In some cases, these medicines need to be changed or stopped 24 hours or more before the test.  If you wear a nitroglycerin patch, it may need to be removed prior to the test. Ask your health care provider if the patch should be removed before the test.  If you use an inhaler for any breathing condition, bring it with you to the test.  If you are an outpatient, bring a snack so you can eat right after the stress phase of the test.  Do not smoke for 4 hours prior to the test or as directed by your health care provider.  Wear loose-fitting clothes and comfortable shoes for the test. This test involves walking on a treadmill. PROCEDURE   Multiple electrodes will be put on your chest. If needed, small areas of your chest may be shaved to get better contact with the electrodes. Once the electrodes are attached to your body, multiple wires will be attached to the electrodes, and your heart rate will  be monitored.  You will have an echocardiogram done at rest.  To produce this image of your heart, gel is applied to your chest, and a wand-like tool (transducer) is moved over the chest. The transducer sends the sound waves through the chest to create the moving images of your heart.  You  may need an IV to receive a medication that improves the quality of the pictures.  You will then walk on a treadmill. The treadmill will be started at a slow pace. The treadmill speed and incline will gradually be increased to raise your heart rate.  At the peak of exercise, the treadmill will be stopped. You will lie down immediately on a bed so that a second echocardiogram can be done to visualize your heart's motion with exercise.  The test usually takes 30-60 minutes to complete. AFTER THE PROCEDURE  Your heart rate and blood pressure will be monitored after the test.  You may return to your normal schedule, including diet, activities, and medicines, unless your health care provider tells you otherwise. Document Released: 10/26/2004 Document Revised: 10/27/2013 Document Reviewed: 06/29/2013 [facility] Patient Information 2015 [place], [place]. This information is not intended to replace advice given to you by your health care provider. Make sure you discuss any questions you have with your health care provider.

## 2015-01-05 ENCOUNTER — Ambulatory Visit (HOSPITAL_COMMUNITY): Payer: BLUE CROSS/BLUE SHIELD

## 2015-01-05 ENCOUNTER — Encounter (HOSPITAL_COMMUNITY): Payer: Self-pay | Admitting: Radiology

## 2015-01-05 ENCOUNTER — Telehealth: Payer: Self-pay | Admitting: Family Medicine

## 2015-01-05 ENCOUNTER — Ambulatory Visit (HOSPITAL_COMMUNITY): Payer: BLUE CROSS/BLUE SHIELD | Attending: Cardiovascular Disease | Admitting: Radiology

## 2015-01-05 DIAGNOSIS — E785 Hyperlipidemia, unspecified: Secondary | ICD-10-CM

## 2015-01-05 DIAGNOSIS — R072 Precordial pain: Secondary | ICD-10-CM

## 2015-01-05 NOTE — Progress Notes (Addendum)
Stress echocardiogram canceled due pre exam hypertension per DOD, Dr. Mare Ferrari. Pre exam blood pressures were 178/110 and 175/107. Dr. Mare Ferrari recommended patient see PCP to get blood pressure under control. Patient is not on blood pressure medications. Patient will get an appointment to see Dr. Dennard Schaumann and call Dr. Stanford Breed to determine when to reschedule the stress echocardiogram.

## 2015-01-05 NOTE — Progress Notes (Signed)
Stress echocardiogram canceled due pre exam hypertension per DOD, Dr. Mare Ferrari.  Pre exam blood pressures were 178/110 and 175/107.  Dr. Mare Ferrari recommended patient see PCP to get blood pressure under control.  Patient is not on blood pressure medications.  Patient will get an appointment to see Dr. Dennard Schaumann and call Dr. Stanford Breed to determine when to reschedule the stress echocardiogram.

## 2015-01-05 NOTE — Telephone Encounter (Signed)
Went to have Cardiac stress test done today.  BP was very high and they canceled it and told her to see her PCP.  (See cardio entry from that  visit)  Has BP machine at home.  When got home she says BP was 194/114 L and 173/102 R.  Said you had given her Amlodipine in the past for short time and she still has some.  Should she take??  Verified it was not expired.  No appt available until Monday!!  Told her will relay this to you, you can authorized earlier work in visit.  Told her, yes, take the Amlodipine.  Take BP reading everyday and bring to appt.

## 2015-01-05 NOTE — Telephone Encounter (Signed)
Work in today

## 2015-01-06 ENCOUNTER — Encounter: Payer: Self-pay | Admitting: Cardiology

## 2015-01-06 ENCOUNTER — Ambulatory Visit (INDEPENDENT_AMBULATORY_CARE_PROVIDER_SITE_OTHER): Payer: BLUE CROSS/BLUE SHIELD | Admitting: Family Medicine

## 2015-01-06 ENCOUNTER — Encounter: Payer: Self-pay | Admitting: Family Medicine

## 2015-01-06 VITALS — BP 150/100 | HR 86 | Temp 98.0°F | Resp 20 | Ht 67.0 in | Wt 295.0 lb

## 2015-01-06 DIAGNOSIS — I10 Essential (primary) hypertension: Secondary | ICD-10-CM

## 2015-01-06 MED ORDER — LOSARTAN POTASSIUM-HCTZ 50-12.5 MG PO TABS: 1.0000 | ORAL_TABLET | Freq: Every day | ORAL | Status: DC

## 2015-01-06 NOTE — Telephone Encounter (Signed)
Some one answered phone at home.  Pt was at pharmacy.  Left message for her to call back ASAP.  Dr Dennard Schaumann wants to see today.

## 2015-01-06 NOTE — Telephone Encounter (Signed)
This encounter was created in error - please disregard.

## 2015-01-06 NOTE — Telephone Encounter (Signed)
Returning your call from yesterday. °

## 2015-01-06 NOTE — Progress Notes (Signed)
I want to see her today

## 2015-01-06 NOTE — Telephone Encounter (Signed)
Has appt today at 11AM

## 2015-01-06 NOTE — Progress Notes (Signed)
Subjective:    Patient ID: Kelly [name] Age, female    DOB: November 09, 1960, 54 y.o.   MRN: [id number]  HPI Please see notes from yesterday.  Apparently, blood pressure was extremely high requiring the patient to cancel her stress test.  She is here today for follow up:  "Stress echocardiogram canceled due pre exam hypertension per DOD, Dr. Mare Ferrari. Pre exam blood pressures were 178/110 and 175/107. Dr. Mare Ferrari recommended patient see PCP to get blood pressure under control. Patient is not on blood pressure medications. Patient will get an appointment to see Dr. Dennard Schaumann and call Dr. Stanford Breed to determine when to reschedule the stress echocardiogram."   Patient has been checking her blood pressure at home of the last 24 hours and her blood pressures typically 155-194/90-106. She did take 1 dose of amlodipine last night.  Past Medical History  Diagnosis Date  . Obesity   . Hyperlipidemia   . GERD (gastroesophageal reflux disease)   . Depression   . Seasonal allergies   . IBS (irritable bowel syndrome)   . Headache disorder   . Salivary calculus   . Salivary calculus   . Allergy   . Anxiety    Past Surgical History  Procedure Laterality Date  . Temporomandibular joint surgery      3 times  . Abdominal hysterectomy  2001    partial hysterectomy  . Salivary gland surgery     Current Outpatient Prescriptions on File Prior to Visit  Medication Sig Dispense Refill  . ALPRAZolam (XANAX) 0.25 MG tablet Take 0.25 mg by mouth as needed.     . betamethasone valerate (VALISONE) 0.1 % cream     . clobetasol (OLUX) 0.05 % topical foam Apply topically 2 (two) times daily. Apply sparingly once daily to scalp as needed 50 g 0  . Cyanocobalamin (VITAMIN B12 PO) Take by mouth.    . esomeprazole (NEXIUM) 40 MG capsule Take 1 capsule (40 mg total) by mouth daily. 90 capsule 3  . etodolac (LODINE) 400 MG tablet Take 1 tablet (400 mg total) by mouth 2 (two) times daily. 180 tablet 3  . fluticasone  (FLONASE) 50 MCG/ACT nasal spray USE 2 SPRAYS IN EACH       NOSTRIL DAILY 48 g 3  . gabapentin (NEURONTIN) 300 MG capsule Take 1 capsule (300 mg total) by mouth 3 (three) times daily. 270 capsule 3  . ibuprofen (ADVIL,MOTRIN) 800 MG tablet Take 800 mg by mouth as needed.     Marland Kitchen levocetirizine (XYZAL) 5 MG tablet TAKE 1 TABLET DAILY 90 tablet 3   No current facility-administered medications on file prior to visit.   Allergies  Allergen Reactions  . Codeine Nausea Only  . Topamax [Topiramate] Nausea Only  . Maxalt [Rizatriptan] Nausea Only   History   Social History  . Marital Status: Single    Spouse Name: n/a  . Number of Children: 0  . Years of Education: Associates   Occupational History  . 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 Collector     Day Surgery   Social History Main Topics  . Smoking status: Former Smoker -- 7 years    Types: Cigarettes  . Smokeless tobacco: Never Used     Comment: 1.5 pack a week  . Alcohol Use: 0.0 oz/week    0 Standard drinks or equivalent per week     Comment: Very seldom  . Drug Use: No  . Sexual Activity: Not on file  Other Topics Concern  . Not on file   Social History Narrative   Lives with her fiance. He has children.   Raised a niece after her brother died, she is now adult.      Review of Systems  All other systems reviewed and are negative.      Objective:   Physical Exam  Constitutional: She appears well-developed and well-nourished.  Cardiovascular: Normal rate, regular rhythm, normal heart sounds and intact distal pulses.  Exam reveals no gallop and no friction rub.   No murmur heard. Pulmonary/Chest: Effort normal and breath sounds normal. No respiratory distress. She has no wheezes. She has no rales.  Abdominal: Soft. Bowel sounds are normal. She exhibits no distension. There is no tenderness. There is no rebound.  Musculoskeletal: She exhibits no edema.  Vitals reviewed.         Assessment &  Plan:  Benign essential HTN - Plan: losartan-hydrochlorothiazide (HYZAAR) 50-12.5 MG per tablet  I do not feel that amlodipine 5 mg a day will be sufficient to bring down stage II hypertension which the patient seems to be having. Furthermore I think it will complicate lower extremity edema she is already beginning to have gabapentin. Therefore I started the patient on Hyzaar 50/12.5 one by mouth daily and I will recheck her blood pressure in one week.Marland Kitchen

## 2015-01-06 NOTE — Progress Notes (Signed)
Add norvasc 5 mg daily and follow BP; FU PA OV 2-4 weeks; once BP controlled, reschedule stess echo.    [place] with pt, dr Dennard Schaumann started the patient on amlodipine 5 mg yesterday. She is currently on the way to his office to be seen. Her bp this am was 155/103. She will have dr pickard forward his note to [place].

## 2015-01-10 ENCOUNTER — Ambulatory Visit: Payer: Self-pay | Admitting: Family Medicine

## 2015-01-11 ENCOUNTER — Ambulatory Visit: Payer: Self-pay | Admitting: Family Medicine

## 2015-01-13 ENCOUNTER — Encounter: Payer: Self-pay | Admitting: Family Medicine

## 2015-01-13 ENCOUNTER — Ambulatory Visit (INDEPENDENT_AMBULATORY_CARE_PROVIDER_SITE_OTHER): Payer: BLUE CROSS/BLUE SHIELD | Admitting: Family Medicine

## 2015-01-13 VITALS — BP 130/88 | HR 80 | Temp 98.0°F | Resp 18 | Ht 67.0 in | Wt 294.0 lb

## 2015-01-13 DIAGNOSIS — I10 Essential (primary) hypertension: Secondary | ICD-10-CM

## 2015-01-13 NOTE — Progress Notes (Signed)
Subjective:    Patient ID: Kelly [name], female    DOB: [birth year], 54 y.o.   MRN: [id number]  Hypertension  01/06/15 Please see notes from yesterday.  Apparently, blood pressure was extremely high requiring the patient to cancel her stress test.  She is here today for follow up:  "Stress echocardiogram canceled due pre exam hypertension per DOD, Dr. Mare Ferrari. Pre exam blood pressures were 178/110 and 175/107. Dr. Mare Ferrari recommended patient see PCP to get blood pressure under control. Patient is not on blood pressure medications. Patient will get an appointment to see Dr. Dennard Schaumann and call Dr. Stanford Breed to determine when to reschedule the stress echocardiogram."   Patient has been checking her blood pressure at home of the last 24 hours and her blood pressures typically 155-194/90-106. She did take 1 dose of amlodipine last night.  At that time, my plan was:  I do not feel that amlodipine 5 mg a day will be sufficient to bring down stage II hypertension which the patient seems to be having. Furthermore I think it will complicate lower extremity edema she is already beginning to have gabapentin. Therefore I started the patient on Hyzaar 50/12.5 one by mouth daily and I will recheck her blood pressure in one week..  01/13/15 Patient's blood pressure has been much better recently. Her systolic blood pressure ranges 122-150. Diastolic blood pressure ranges from 83-94.  She denies any complications from the new medication.  Past Medical History  Diagnosis Date  . Obesity   . Hyperlipidemia   . GERD (gastroesophageal reflux disease)   . Depression   . Seasonal allergies   . IBS (irritable bowel syndrome)   . Headache disorder   . Salivary calculus   . Salivary calculus   . Allergy   . Anxiety    Past Surgical History  Procedure Laterality Date  . Temporomandibular joint surgery      3 times  . Abdominal hysterectomy  2001    partial hysterectomy  . Salivary gland surgery      Current Outpatient Prescriptions on File Prior to Visit  Medication Sig Dispense Refill  . ALPRAZolam (XANAX) 0.25 MG tablet Take 0.25 mg by mouth as needed.     Marland Kitchen amLODipine (NORVASC) 5 MG tablet Take 5 mg by mouth daily.    . betamethasone valerate (VALISONE) 0.1 % cream     . clobetasol (OLUX) 0.05 % topical foam Apply topically 2 (two) times daily. Apply sparingly once daily to scalp as needed 50 g 0  . Cyanocobalamin (VITAMIN B12 PO) Take by mouth.    . esomeprazole (NEXIUM) 40 MG capsule Take 1 capsule (40 mg total) by mouth daily. 90 capsule 3  . etodolac (LODINE) 400 MG tablet Take 1 tablet (400 mg total) by mouth 2 (two) times daily. 180 tablet 3  . fluticasone (FLONASE) 50 MCG/ACT nasal spray USE 2 SPRAYS IN EACH       NOSTRIL DAILY 48 g 3  . gabapentin (NEURONTIN) 300 MG capsule Take 1 capsule (300 mg total) by mouth 3 (three) times daily. 270 capsule 3  . ibuprofen (ADVIL,MOTRIN) 800 MG tablet Take 800 mg by mouth as needed.     Marland Kitchen levocetirizine (XYZAL) 5 MG tablet TAKE 1 TABLET DAILY 90 tablet 3  . losartan-hydrochlorothiazide (HYZAAR) 50-12.5 MG per tablet Take 1 tablet by mouth daily. 90 tablet 3   No current facility-administered medications on file prior to visit.   Allergies  Allergen Reactions  . Codeine Nausea Only  .  Topamax [Topiramate] Nausea Only  . Maxalt [Rizatriptan] Nausea Only   History   Social History  . Marital Status: Single    Spouse Name: n/a  . Number of Children: 0  . Years of Education: Associates   Occupational History  . 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 Collector     Day Surgery   Social History Main Topics  . Smoking status: Former Smoker -- 7 years    Types: Cigarettes  . Smokeless tobacco: Never Used     Comment: 1.5 pack a week  . Alcohol Use: 0.0 oz/week    0 Standard drinks or equivalent per week     Comment: Very seldom  . Drug Use: No  . Sexual Activity: Not on file   Other Topics Concern  . Not on  file   Social History Narrative   Lives with her fiance. He has children.   Raised a niece after her brother died, she is now adult.      Review of Systems  All other systems reviewed and are negative.      Objective:   Physical Exam  Constitutional: She appears well-developed and well-nourished.  Cardiovascular: Normal rate, regular rhythm, normal heart sounds and intact distal pulses.  Exam reveals no gallop and no friction rub.   No murmur heard. Pulmonary/Chest: Effort normal and breath sounds normal. No respiratory distress. She has no wheezes. She has no rales.  Abdominal: Soft. Bowel sounds are normal. She exhibits no distension. There is no tenderness. There is no rebound.  Musculoskeletal: She exhibits no edema.  Vitals reviewed.         Assessment & Plan:  Benign essential HTN  Blood pressure is much better. I will increase Hyzaar to 100/25 by mouth daily. I believe this will get all of her blood pressures to goal. At this point I believe she is now cleared to proceed with her stress test that her cardiologist. I will send a note her cardiologist and notify him.

## 2015-02-10 ENCOUNTER — Encounter: Payer: Self-pay | Admitting: Family Medicine

## 2015-02-10 ENCOUNTER — Ambulatory Visit (INDEPENDENT_AMBULATORY_CARE_PROVIDER_SITE_OTHER): Payer: BLUE CROSS/BLUE SHIELD | Admitting: Family Medicine

## 2015-02-10 ENCOUNTER — Ambulatory Visit: Payer: BLUE CROSS/BLUE SHIELD | Admitting: Family Medicine

## 2015-02-10 ENCOUNTER — Telehealth: Payer: Self-pay | Admitting: Family Medicine

## 2015-02-10 VITALS — BP 128/90 | HR 84 | Temp 97.9°F | Resp 18 | Ht 67.0 in | Wt 299.0 lb

## 2015-02-10 DIAGNOSIS — M545 Low back pain, unspecified: Secondary | ICD-10-CM

## 2015-02-10 DIAGNOSIS — I8311 Varicose veins of right lower extremity with inflammation: Secondary | ICD-10-CM

## 2015-02-10 DIAGNOSIS — I872 Venous insufficiency (chronic) (peripheral): Secondary | ICD-10-CM

## 2015-02-10 DIAGNOSIS — I8312 Varicose veins of left lower extremity with inflammation: Secondary | ICD-10-CM

## 2015-02-10 DIAGNOSIS — I10 Essential (primary) hypertension: Secondary | ICD-10-CM

## 2015-02-10 LAB — COMPLETE METABOLIC PANEL WITH GFR
ALT: 37 U/L — AB (ref 0–35)
AST: 23 U/L (ref 0–37)
Albumin: 4.1 g/dL (ref 3.5–5.2)
Alkaline Phosphatase: 91 U/L (ref 39–117)
BILIRUBIN TOTAL: 0.5 mg/dL (ref 0.2–1.2)
BUN: 12 mg/dL (ref 6–23)
CO2: 26 mEq/L (ref 19–32)
CREATININE: 0.7 mg/dL (ref 0.50–1.10)
Calcium: 8.9 mg/dL (ref 8.4–10.5)
Chloride: 102 mEq/L (ref 96–112)
Glucose, Bld: 113 mg/dL — ABNORMAL HIGH (ref 70–99)
Potassium: 4 mEq/L (ref 3.5–5.3)
Sodium: 139 mEq/L (ref 135–145)
Total Protein: 6.7 g/dL (ref 6.0–8.3)

## 2015-02-10 LAB — CBC WITH DIFFERENTIAL/PLATELET
Basophils Absolute: 0.1 10*3/uL (ref 0.0–0.1)
Basophils Relative: 1 % (ref 0–1)
EOS ABS: 0.3 10*3/uL (ref 0.0–0.7)
EOS PCT: 4 % (ref 0–5)
HEMATOCRIT: 41.6 % (ref 36.0–46.0)
Hemoglobin: 13.9 g/dL (ref 12.0–15.0)
Lymphocytes Relative: 29 % (ref 12–46)
Lymphs Abs: 1.9 10*3/uL (ref 0.7–4.0)
MCH: 28.2 pg (ref 26.0–34.0)
MCHC: 33.4 g/dL (ref 30.0–36.0)
MCV: 84.4 fL (ref 78.0–100.0)
MPV: 9.9 fL (ref 8.6–12.4)
Monocytes Absolute: 0.5 10*3/uL (ref 0.1–1.0)
Monocytes Relative: 8 % (ref 3–12)
NEUTROS PCT: 58 % (ref 43–77)
Neutro Abs: 3.7 10*3/uL (ref 1.7–7.7)
Platelets: 324 10*3/uL (ref 150–400)
RBC: 4.93 MIL/uL (ref 3.87–5.11)
RDW: 14.6 % (ref 11.5–15.5)
WBC: 6.4 10*3/uL (ref 4.0–10.5)

## 2015-02-10 LAB — LIPID PANEL
CHOL/HDL RATIO: 5.6 ratio
Cholesterol: 189 mg/dL (ref 0–200)
HDL: 34 mg/dL — ABNORMAL LOW (ref >=46)
LDL CALC: 131 mg/dL — AB (ref 0–99)
Triglycerides: 122 mg/dL (ref <150)
VLDL: 24 mg/dL (ref 0–40)

## 2015-02-10 MED ORDER — CYCLOBENZAPRINE HCL 10 MG PO TABS: 10.0000 mg | ORAL_TABLET | Freq: Three times a day (TID) | ORAL | Status: DC | PRN

## 2015-02-10 NOTE — Telephone Encounter (Signed)
Med sent to [facility] for 90 pill for 90 day supply

## 2015-02-10 NOTE — Telephone Encounter (Signed)
ok 

## 2015-02-10 NOTE — Telephone Encounter (Signed)
Ok to do 90 day supply

## 2015-02-10 NOTE — Progress Notes (Signed)
Subjective:    Patient ID: Kelly [name] Age, female    DOB: 1960/12/26, 54 y.o.   MRN: [id number]  HPI 01/06/15 Please see notes from yesterday.  Apparently, blood pressure was extremely high requiring the patient to cancel her stress test.  She is here today for follow up:  "Stress echocardiogram canceled due pre exam hypertension per DOD, Dr. Mare Ferrari. Pre exam blood pressures were 178/110 and 175/107. Dr. Mare Ferrari recommended patient see PCP to get blood pressure under control. Patient is not on blood pressure medications. Patient will get an appointment to see Dr. Dennard Schaumann and call Dr. Stanford Breed to determine when to reschedule the stress echocardiogram."   Patient has been checking her blood pressure at home of the last 24 hours and her blood pressures typically 155-194/90-106. She did take 1 dose of amlodipine last night.  At that time, my plan was:  I do not feel that amlodipine 5 mg a day will be sufficient to bring down stage II hypertension which the patient seems to be having. Furthermore I think it will complicate lower extremity edema she is already beginning to have gabapentin. Therefore I started the patient on Hyzaar 50/12.5 one by mouth daily and I will recheck her blood pressure in one week..  01/13/15 Patient's blood pressure has been much better recently. Her systolic blood pressure ranges 122-150. Diastolic blood pressure ranges from 83-94.  She denies any complications from the new medication. At that time, my plan was: Renal protection. Continue metformin 1000 mg by mouth twice a day. Actos 30 mg by mouth daily. Add inokana 300 mg poqday.  Recheck A1c in 3 months. I encouraged the patient is alert carbohydrate diet. I encouraged the patient to discontinue smoking. I encouraged the patient to try lose 15-20 pounds by her next office visit. Recheck in 3 months.  02/10/15 Patient is no longer taking amlodipine. She is only on Hyzaar 100/25 by mouth daily. Blood pressures have  consistently been 114-143/76-90. She denies any side effectsfrom the medication. Unfortunately her legs a been swelling more recently. She has venous stasis dermatitis on both shins.  This is characterized by blanchable small pinpoint petechiae on both legs and bronze discoloration to the skin in that area. She is also complaining of low back pain. The pain is paraspinal in nature. It is located in the lower back. She denies any cauda equina syndrome. She denies any numbness or tingling in the legs. She denies any sciatica. Past Medical History  Diagnosis Date  . Obesity   . Hyperlipidemia   . GERD (gastroesophageal reflux disease)   . Depression   . Seasonal allergies   . IBS (irritable bowel syndrome)   . Headache disorder   . Salivary calculus   . Salivary calculus   . Allergy   . Anxiety    Past Surgical History  Procedure Laterality Date  . Temporomandibular joint surgery      3 times  . Abdominal hysterectomy  2001    partial hysterectomy  . Salivary gland surgery     Current Outpatient Prescriptions on File Prior to Visit  Medication Sig Dispense Refill  . ALPRAZolam (XANAX) 0.25 MG tablet Take 0.25 mg by mouth as needed.     . betamethasone valerate (VALISONE) 0.1 % cream     . clobetasol (OLUX) 0.05 % topical foam Apply topically 2 (two) times daily. Apply sparingly once daily to scalp as needed 50 g 0  . Cyanocobalamin (VITAMIN B12 PO) Take by mouth.    Marland Kitchen  esomeprazole (NEXIUM) 40 MG capsule Take 1 capsule (40 mg total) by mouth daily. 90 capsule 3  . etodolac (LODINE) 400 MG tablet Take 1 tablet (400 mg total) by mouth 2 (two) times daily. 180 tablet 3  . fluticasone (FLONASE) 50 MCG/ACT nasal spray USE 2 SPRAYS IN EACH       NOSTRIL DAILY 48 g 3  . gabapentin (NEURONTIN) 300 MG capsule Take 1 capsule (300 mg total) by mouth 3 (three) times daily. 270 capsule 3  . ibuprofen (ADVIL,MOTRIN) 800 MG tablet Take 800 mg by mouth as needed.     Marland Kitchen levocetirizine (XYZAL) 5 MG  tablet TAKE 1 TABLET DAILY 90 tablet 3  . losartan-hydrochlorothiazide (HYZAAR) 50-12.5 MG per tablet Take 1 tablet by mouth daily. 90 tablet 3   No current facility-administered medications on file prior to visit.   Allergies  Allergen Reactions  . Codeine Nausea Only  . Topamax [Topiramate] Nausea Only  . Maxalt [Rizatriptan] Nausea Only   History   Social History  . Marital Status: Single    Spouse Name: n/a  . Number of Children: 0  . Years of Education: Associates   Occupational History  . Registration-Relief     [place], [employer]  . Collector     Day Surgery   Social History Main Topics  . Smoking status: Former Smoker -- 7 years    Types: Cigarettes  . Smokeless tobacco: Never Used     Comment: 1.5 pack a week  . Alcohol Use: 0.0 oz/week    0 Standard drinks or equivalent per week     Comment: Very seldom  . Drug Use: No  . Sexual Activity: Not on file   Other Topics Concern  . Not on file   Social History Narrative   Lives with her fiance. He has children.   Raised a niece after her brother died, she is now adult.      Review of Systems  All other systems reviewed and are negative.      Objective:   Physical Exam  Constitutional: She appears well-developed and well-nourished.  Cardiovascular: Normal rate, regular rhythm, normal heart sounds and intact distal pulses.  Exam reveals no gallop and no friction rub.   No murmur heard. Pulmonary/Chest: Effort normal and breath sounds normal. No respiratory distress. She has no wheezes. She has no rales.  Abdominal: Soft. Bowel sounds are normal. She exhibits no distension. There is no tenderness. There is no rebound.  Musculoskeletal: She exhibits no edema.  Vitals reviewed.         Assessment & Plan:  Essential hypertension - Plan: CBC with Differential/Platelet, COMPLETE METABOLIC PANEL WITH GFR, Lipid panel  Midline low back pain without sciatica - Plan: cyclobenzaprine (FLEXERIL) 10 MG  tablet  Venous stasis dermatitis of both lower extremities  Blood pressure is well controlled. I will continue the Hyzaar 100/25 one by mouth daily. I will check a CMP today to monitor her potassium and her renal function on the new medication. While on obtaining the CMP I will check a fasting lipid panel to monitor her cholesterol. I believe her low back pain is likely due to chronic muscle strain from obesity coupled with her large bust. I have recommended 30-50 pounds of weight loss. She could consider breast reduction surgery. Meanwhile I have recommended that she begin exercising daily and have recommended that she wear a low back brace for muscle support to prevent worsening of the pain. She can take Flexeril  10 mg every 8 hours as needed. I believe the rash on her legs venous stasis dermatitis. I have recommended knee-high compression hose 20-30 mmHg each side.

## 2015-02-10 NOTE — Telephone Encounter (Signed)
Patient calling to get 90 day supply of the flexeril that dr pickard prescribed today, wants to know can we do immediatly she is going out of town today  [employer]

## 2015-02-14 ENCOUNTER — Other Ambulatory Visit: Payer: Self-pay | Admitting: Family Medicine

## 2015-02-14 ENCOUNTER — Telehealth: Payer: Self-pay | Admitting: *Deleted

## 2015-02-14 DIAGNOSIS — M545 Low back pain, unspecified: Secondary | ICD-10-CM

## 2015-02-14 MED ORDER — CYCLOBENZAPRINE HCL 10 MG PO TABS: 10.0000 mg | ORAL_TABLET | Freq: Three times a day (TID) | ORAL | Status: DC | PRN

## 2015-02-14 NOTE — Telephone Encounter (Signed)
I explained to pt earlier this morning that this is a PRN medication for her muscle spasms and only to be used as needed not everyday. I did speak to WTP about this and he agrees that it is a short term med and 90 should last her 90 days if needed for that amount of time. 90 tablets for 90 day supply is correct amount.

## 2015-02-14 NOTE — Telephone Encounter (Signed)
Received call from pharmacy.   Reports that patient has contacted pharmacy in regards to flexeril prescription. States that patient is upset in regards to quantity of medication. Patient reportedly requesting quantity of 270.  Advised that quantity of 90 tabs per 90 days was correct for PRN medication.

## 2015-02-14 NOTE — Telephone Encounter (Signed)
Spoke to pt again and she states that it has to have a sig to match a 90 day supply - told pt I would call pharm and change it to 1 tab a day and that should get her 90 day supply for free. She agreed and understood that she was only going to get 90 pills for 90 days. I then called the pharm and Threasa Beards states that she has already came to pick up the medication and that it could not be changed since she has already gotten it and that she only paid a little over 6 dollars. Will change next time if in 90 days she still needs.

## 2015-02-15 ENCOUNTER — Telehealth: Payer: Self-pay | Admitting: Family Medicine

## 2015-02-15 NOTE — Telephone Encounter (Signed)
Patient called in requesting to speak with me. I spoke with patient she would like flexeril sent in for 90 days supply. I advised patient of the phone message from 02/14/15 that the pharmacy states they couldn't change it this time for patient since she had picked up medication. Patient states that "her old man picked it up on Saturday; however when he told her she was charged for the medication she took it back within 5 minutes". She states that she called the pharmacy prior to calling here and they told her that we still hadn't called in the 90 day supply.

## 2015-02-15 NOTE — Telephone Encounter (Signed)
I called and spoke to [facility] and she thought when I talked with her yesterday that the pt had already picked up the RX and in fact she had but the pt returned it. I called in the Flexeril again at one a day for a 90 day supply so that ins will hopefully cover it for free. Pt is aware via vm.

## 2015-03-03 ENCOUNTER — Telehealth: Payer: Self-pay | Admitting: Family Medicine

## 2015-03-03 NOTE — Telephone Encounter (Signed)
[phone] PT is wanting to speak to you about a letter of recommendation from Dr Dennard Schaumann

## 2015-03-04 NOTE — Telephone Encounter (Signed)
Pt needs a letter to her insurance company for her to have breast reduction - (call when ready) she will come pick it up

## 2015-03-07 ENCOUNTER — Encounter: Payer: Self-pay | Admitting: Family Medicine

## 2015-03-07 NOTE — Telephone Encounter (Signed)
Pt aware via vm and letter left up front

## 2015-04-06 ENCOUNTER — Telehealth: Payer: Self-pay | Admitting: Family Medicine

## 2015-04-06 DIAGNOSIS — L219 Seborrheic dermatitis, unspecified: Secondary | ICD-10-CM

## 2015-04-06 MED ORDER — CLOBETASOL PROPIONATE 0.05 % EX FOAM: Freq: Two times a day (BID) | CUTANEOUS | Status: DC

## 2015-04-06 NOTE — Telephone Encounter (Signed)
Patient stopped by office requesting a refill on her clobetasol propionate foam she is requesting 90 day supply. Please call when ready

## 2015-04-06 NOTE — Telephone Encounter (Signed)
Spoke to pt and she needs a 90 day supply sent to pharmacy - Medication called/sent to requested pharmacy

## 2015-04-21 ENCOUNTER — Other Ambulatory Visit: Payer: Self-pay | Admitting: Plastic Surgery

## 2015-04-21 ENCOUNTER — Ambulatory Visit (HOSPITAL_BASED_OUTPATIENT_CLINIC_OR_DEPARTMENT_OTHER): Payer: BLUE CROSS/BLUE SHIELD

## 2015-04-21 ENCOUNTER — Ambulatory Visit (HOSPITAL_COMMUNITY): Payer: BLUE CROSS/BLUE SHIELD | Attending: Internal Medicine

## 2015-04-21 DIAGNOSIS — R079 Chest pain, unspecified: Secondary | ICD-10-CM | POA: Insufficient documentation

## 2015-04-22 ENCOUNTER — Telehealth: Payer: Self-pay | Admitting: Family Medicine

## 2015-04-22 NOTE — Telephone Encounter (Signed)
Patient says there is going to be a form sent over today for clearance of surgery, she wants to know if possible can dr pickard fill out that form today? [phone] if any questions for her

## 2015-04-22 NOTE — Telephone Encounter (Signed)
Pt needs a sign off  from provider of the ECHOCARDIOGRAM  in order to have surgery clearance of bilateral breast reduction too be performing on Tuesday June 21. She needs done by Monday.  Please call pt [phone] when sign off so that she can come get copy.

## 2015-04-25 HISTORY — PX: REDUCTION MAMMAPLASTY: SUR839

## 2015-05-13 ENCOUNTER — Ambulatory Visit (INDEPENDENT_AMBULATORY_CARE_PROVIDER_SITE_OTHER): Payer: BLUE CROSS/BLUE SHIELD | Admitting: Family Medicine

## 2015-05-13 ENCOUNTER — Encounter: Payer: Self-pay | Admitting: Family Medicine

## 2015-05-13 ENCOUNTER — Telehealth: Payer: Self-pay | Admitting: *Deleted

## 2015-05-13 VITALS — BP 140/88 | HR 88 | Temp 97.8°F | Resp 14 | Ht 67.0 in | Wt 290.0 lb

## 2015-05-13 DIAGNOSIS — K648 Other hemorrhoids: Secondary | ICD-10-CM | POA: Diagnosis not present

## 2015-05-13 DIAGNOSIS — K644 Residual hemorrhoidal skin tags: Secondary | ICD-10-CM

## 2015-05-13 MED ORDER — HYDROCORTISONE ACE-PRAMOXINE 1-1 % RE FOAM: 1.0000 | Freq: Two times a day (BID) | RECTAL | Status: DC

## 2015-05-13 MED ORDER — HYDROCORTISONE ACETATE 25 MG RE SUPP: 25.0000 mg | Freq: Two times a day (BID) | RECTAL | Status: DC

## 2015-05-13 NOTE — Telephone Encounter (Signed)
Pt called stating that she went to pick up her prescription PROCOTOFOAM and stated that it is expensive and wants to know if there is somethng else you can prescribe that is much cheaper.  [facility].

## 2015-05-13 NOTE — Telephone Encounter (Signed)
Anusol sent to pharmacy and pt is aware of appt

## 2015-05-13 NOTE — Progress Notes (Signed)
Patient ID: Kelly [name], female   DOB: 1960/12/31, 54 y.o.   MRN: [id number]   Subjective:    Patient ID: Kelly [name], female    DOB: [birth year], 54 y.o.   MRN: [id number]  Patient presents for Anal Pain  Pt here with rectal pain, before she had her breast augmentation surgery done in 6/21 she had a very large bowel movement, after waking from surgery had pain in rectal area, thought she may have had a fissure, she has some hemorroids as well. Did not have BM for 6 days after surgery then had another large BM, she had blood with the BM and still gets some BRBPR with bowel movements. Taking miralax which has helped, has used OTC preperation H, tucks pads,sitz bath, persistant pain in rectum   Note stop taking her BP meds on her on, states it was running good   Review Of Systems: per above   GEN- denies fatigue, fever, weight loss,weakness, recent illness HEENT- denies eye drainage, change in vision, nasal discharge, CVS- denies chest pain, palpitations RESP- denies SOB, cough, wheeze ABD- denies N/V, change in stools, abd pain GU- denies dysuria, hematuria, dribbling, incontinence MSK- denies joint pain, muscle aches, injury Neuro- denies headache, dizziness, syncope, seizure activity       Objective:    BP 140/88 mmHg  Pulse 88  Temp(Src) 97.8 F (36.6 C) (Oral)  Resp 14  Ht 5\' 7"  (1.702 m)  Wt 290 lb (131.543 kg)  BMI 45.41 kg/m2 GEN- NAD, alert and oriented x3 GU- rectum- 3 large hemorrhoids, TTP, FOBT positive, stool soft in vault, no fissure seen, internal hemorroid palpated       Assessment & Plan:      Problem List Items Addressed This Visit    None    Visit Diagnoses    External hemorrhoid, bleeding    -  Primary    no thrombosed hemorroids, continue stool softener/laxative- Anusol HC, sitz baths       Note: This dictation was prepared with Dragon dictation along with smaller phrase technology. Any transcriptional errors that result from this process are  unintentional.

## 2015-05-13 NOTE — Telephone Encounter (Signed)
Try Anusol HC

## 2015-05-13 NOTE — Patient Instructions (Signed)
Use the cortisone suppositories as prescribed Continue miralax or stool softener Restart your blood pressure medication  F/U as needed

## 2015-05-25 ENCOUNTER — Other Ambulatory Visit: Payer: Self-pay | Admitting: Family Medicine

## 2015-06-07 ENCOUNTER — Telehealth: Payer: Self-pay | Admitting: Family Medicine

## 2015-06-07 NOTE — Telephone Encounter (Signed)
Patient calling to say that since she is taking a different dosage of her losartan and she is going to run out and needs a new prescription  If possible

## 2015-06-08 MED ORDER — LOSARTAN POTASSIUM-HCTZ 100-25 MG PO TABS: 1.0000 | ORAL_TABLET | Freq: Every day | ORAL | Status: DC

## 2015-06-08 NOTE — Telephone Encounter (Signed)
Pt was started on Hyzaar 100/25 in March and had been doubling what she had and now needs a rx for the increased dose. Medication called/sent to requested pharmacy

## 2015-06-22 ENCOUNTER — Other Ambulatory Visit: Payer: Self-pay | Admitting: Family Medicine

## 2015-07-10 ENCOUNTER — Other Ambulatory Visit: Payer: Self-pay | Admitting: Family Medicine

## 2015-07-13 ENCOUNTER — Other Ambulatory Visit: Payer: Self-pay | Admitting: Family Medicine

## 2015-07-13 MED ORDER — HYDROCORTISONE ACETATE 25 MG RE SUPP: 25.0000 mg | Freq: Two times a day (BID) | RECTAL | Status: DC

## 2015-07-28 ENCOUNTER — Other Ambulatory Visit: Payer: Self-pay | Admitting: Family Medicine

## 2015-07-28 MED ORDER — LOSARTAN POTASSIUM-HCTZ 100-25 MG PO TABS: 1.0000 | ORAL_TABLET | Freq: Every day | ORAL | Status: DC

## 2015-07-28 NOTE — Telephone Encounter (Signed)
Medication called/sent to requested pharmacy  

## 2015-08-01 ENCOUNTER — Encounter: Payer: Self-pay | Admitting: Family Medicine

## 2015-08-01 DIAGNOSIS — I10 Essential (primary) hypertension: Secondary | ICD-10-CM

## 2015-08-12 ENCOUNTER — Ambulatory Visit: Payer: BLUE CROSS/BLUE SHIELD | Admitting: Family Medicine

## 2015-08-18 ENCOUNTER — Telehealth: Payer: Self-pay | Admitting: Family Medicine

## 2015-08-18 MED ORDER — LOSARTAN POTASSIUM-HCTZ 100-25 MG PO TABS: 1.0000 | ORAL_TABLET | Freq: Every day | ORAL | Status: DC

## 2015-08-18 NOTE — Telephone Encounter (Signed)
According to my last note, she was supposed to be on hyzaar 100/25  That's what I would refill.

## 2015-08-18 NOTE — Telephone Encounter (Signed)
Refill Hyzaar 100/25 to mail order per provider

## 2015-08-18 NOTE — Telephone Encounter (Signed)
Have rec'd refill request from mail order for Losartan/HCTZ 50/12.5 mg.  This is not on pt med list and looking at last patient communication, seems pt has stopped taking on her own.  Please advise?

## 2015-09-17 ENCOUNTER — Other Ambulatory Visit: Payer: Self-pay | Admitting: Family Medicine

## 2015-10-31 ENCOUNTER — Other Ambulatory Visit: Payer: Self-pay | Admitting: Family Medicine

## 2015-11-01 ENCOUNTER — Telehealth: Payer: Self-pay | Admitting: Family Medicine

## 2015-11-01 MED ORDER — LOSARTAN POTASSIUM-HCTZ 100-25 MG PO TABS: 1.0000 | ORAL_TABLET | Freq: Every day | ORAL | Status: DC

## 2015-11-01 NOTE — Telephone Encounter (Signed)
Patient is calling to get refill on losartan she said it was not approved so she called  [facility] [phone] if any questions

## 2015-11-01 NOTE — Telephone Encounter (Signed)
Medication called/sent to requested pharmacy  

## 2015-11-01 NOTE — Telephone Encounter (Signed)
Hyzaar 50/25 denied, wrong dose

## 2015-11-02 ENCOUNTER — Other Ambulatory Visit: Payer: Self-pay | Admitting: Family Medicine

## 2015-11-02 NOTE — Telephone Encounter (Signed)
Dr. Dennard Schaumann patient called clinic asking to speak with me. I got on the phone with patient she states "she isn't sure what is going on with [facility] Dr. Samella Parr nurse" she states for the past couple of months every time she calls in to get a refill on her losartan/ Hyzaar 100/25mg  patient states she has never taken the 100/25 that in the past you had written her a prescription for 50/12.5 which she was told to take 2 tablets per day when she was having blood pressure issues. She states that once she got her blood pressure under control you told her she could go back to taking the medication 1 tablet daily. Patient states she isn't going to take 100/25 she states she has never taken that strength.  I advised patient I would get with you and follow up with her tomorrow.   I did look back in her chart and it looks like you started her on 01/06/15 on Hyzaar 50/12.5, her office visit on 01/13/15 it states patient is still using 50/12.5; however at office visit 02/10/15 it states patient is taken hyzaar 100/25.   Please advise

## 2015-11-03 MED ORDER — LOSARTAN POTASSIUM-HCTZ 50-12.5 MG PO TABS: 1.0000 | ORAL_TABLET | Freq: Every day | ORAL | Status: DC

## 2015-11-03 NOTE — Telephone Encounter (Signed)
Pt aware of recommendations and appt made

## 2015-11-03 NOTE — Telephone Encounter (Signed)
Talk to sandy, have her change the med list to hyzaar 50/12.5 daily and refill that for 1 year.  Patient needs ov for lab work in 1 month after being on that dose to see if it is working.

## 2015-11-29 ENCOUNTER — Encounter: Payer: Self-pay | Admitting: Family Medicine

## 2015-11-29 ENCOUNTER — Ambulatory Visit (INDEPENDENT_AMBULATORY_CARE_PROVIDER_SITE_OTHER): Payer: BLUE CROSS/BLUE SHIELD | Admitting: Family Medicine

## 2015-11-29 VITALS — BP 130/74 | HR 82 | Temp 97.8°F | Resp 16 | Ht 67.0 in | Wt 292.0 lb

## 2015-11-29 DIAGNOSIS — L218 Other seborrheic dermatitis: Secondary | ICD-10-CM

## 2015-11-29 DIAGNOSIS — F32A Depression, unspecified: Secondary | ICD-10-CM

## 2015-11-29 DIAGNOSIS — I10 Essential (primary) hypertension: Secondary | ICD-10-CM

## 2015-11-29 DIAGNOSIS — F329 Major depressive disorder, single episode, unspecified: Secondary | ICD-10-CM

## 2015-11-29 DIAGNOSIS — L219 Seborrheic dermatitis, unspecified: Secondary | ICD-10-CM

## 2015-11-29 DIAGNOSIS — L0291 Cutaneous abscess, unspecified: Secondary | ICD-10-CM | POA: Diagnosis not present

## 2015-11-29 MED ORDER — SULFAMETHOXAZOLE-TRIMETHOPRIM 800-160 MG PO TABS: 1.0000 | ORAL_TABLET | Freq: Two times a day (BID) | ORAL | Status: DC

## 2015-11-29 MED ORDER — CLOBETASOL PROPIONATE 0.05 % EX FOAM: Freq: Two times a day (BID) | CUTANEOUS | Status: DC

## 2015-11-29 MED ORDER — ALPRAZOLAM 0.25 MG PO TABS: 0.2500 mg | ORAL_TABLET | Freq: Two times a day (BID) | ORAL | Status: DC | PRN

## 2015-11-29 NOTE — Patient Instructions (Signed)
Take antibiotics for abscess, warm compress Return for fasting labs  Continue current medications  F/U as needed

## 2015-11-29 NOTE — Progress Notes (Signed)
Patient ID: Kelly [name], female   DOB: Apr 02, 1961, 55 y.o.   MRN: [id number]   Subjective:    Patient ID: Kelly [name], female    DOB: [birth year], 55 y.o.   MRN: [id number]  Patient presents for F/U and Abscess  patient here for follow-up on her blood pressure. She's been taken losartan HCTZ 50/12.5 mg. She states her blood pressure has been controlled with this and she has not had any side effects. She does tell me today that her job is closing down and that she will lose her insurance in March.    she noticed an abscess became up in her right axilla a few days ago. She's not had any drainage no fever there is some mild tenderness to touch.    on review of medications in preparation for her losing insurance she is on alprazolam this is typically prescribed by her GYN however she will not be to go to their appointment as it will be after her insurance expires. She requested a refill on this medication.    Review Of Systems:  GEN- denies fatigue, fever, weight loss,weakness, recent illness HEENT- denies eye drainage, change in vision, nasal discharge, CVS- denies chest pain, palpitations RESP- denies SOB, cough, wheeze ABD- denies N/V, change in stools, abd pain GU- denies dysuria, hematuria, dribbling, incontinence MSK- denies joint pain, muscle aches, injury Neuro- denies headache, dizziness, syncope, seizure activity       Objective:    BP 130/74 mmHg  Pulse 82  Temp(Src) 97.8 F (36.6 C) (Oral)  Resp 16  Ht 5\' 7"  (1.702 m)  Wt 292 lb (132.45 kg)  BMI 45.72 kg/m2 GEN- NAD, alert and oriented x3,obese  HEENT- PERRL, EOMI, non injected sclera, pink conjunctiva, MMM, oropharynx clear CVS- RRR, no murmur RESP-CTAB Skin- small dime size boil - minimal fluctuance, mild induration, +erythema, mild TTP Right axilla Psych- normal affect and mood   EXT- No edema Pulses- Radial - 2+        Assessment & Plan:      Problem List Items Addressed This Visit    Essential  hypertension    Blood pressure well controlled. She has a supply of 100/25 mg at home when she runs out of her current medication she can just take a half a tablet of this daily      Depression    I refilled her presently in previous prescribed by her GYN      Relevant Medications   ALPRAZolam (XANAX) 0.25 MG tablet    Other Visit Diagnoses    Seborrheic dermatitis of scalp    -  Primary    Relevant Medications    clobetasol (OLUX) 0.05 % topical foam    Abscess        She is a small abscess in her axilla will treat with warm compresses and Bactrim. No area to I & D today       Note: This dictation was prepared with Dragon dictation along with smaller phrase technology. Any transcriptional errors that result from this process are unintentional.

## 2015-11-30 ENCOUNTER — Encounter: Payer: Self-pay | Admitting: Family Medicine

## 2015-11-30 NOTE — Assessment & Plan Note (Signed)
I refilled her presently in previous prescribed by her GYN

## 2015-11-30 NOTE — Assessment & Plan Note (Signed)
Blood pressure well controlled. She has a supply of 100/25 mg at home when she runs out of her current medication she can just take a half a tablet of this daily

## 2015-12-02 ENCOUNTER — Ambulatory Visit: Payer: BLUE CROSS/BLUE SHIELD | Admitting: Family Medicine

## 2015-12-26 LAB — HM PAP SMEAR

## 2016-01-08 ENCOUNTER — Encounter: Payer: Self-pay | Admitting: Family Medicine

## 2016-01-09 MED ORDER — HYDROCORTISONE 0.5 % EX CREA: 1.0000 "application " | TOPICAL_CREAM | Freq: Two times a day (BID) | CUTANEOUS | Status: DC

## 2016-01-09 MED ORDER — HYDROCORTISONE ACETATE 25 MG RE SUPP: 25.0000 mg | Freq: Two times a day (BID) | RECTAL | Status: DC

## 2016-01-09 MED ORDER — LOSARTAN POTASSIUM-HCTZ 50-12.5 MG PO TABS: 1.0000 | ORAL_TABLET | Freq: Every day | ORAL | Status: DC

## 2016-01-13 ENCOUNTER — Telehealth: Payer: Self-pay | Admitting: Family Medicine

## 2016-01-13 NOTE — Telephone Encounter (Signed)
pharmacy called and stated that in order for you to get the hydrocortisone cream for application we had to increase the percentage to 1%. Per Dr. Dennard Schaumann ok to change and pt aware via mychart

## 2016-04-07 ENCOUNTER — Other Ambulatory Visit: Payer: Self-pay | Admitting: Family Medicine

## 2016-04-09 NOTE — Telephone Encounter (Signed)
Xanax refill request.  Last seen 11/30/2015, last filled 11/29/2015.  Please advise.

## 2016-04-09 NOTE — Telephone Encounter (Signed)
[facility]

## 2016-04-09 NOTE — Telephone Encounter (Signed)
Okay 

## 2016-04-23 ENCOUNTER — Encounter: Payer: Self-pay | Admitting: Family Medicine

## 2016-08-24 ENCOUNTER — Encounter: Payer: Self-pay | Admitting: Family Medicine

## 2016-08-24 MED ORDER — ESOMEPRAZOLE MAGNESIUM 40 MG PO CPDR: 40.0000 mg | DELAYED_RELEASE_CAPSULE | Freq: Every day | ORAL | 3 refills | Status: DC

## 2016-09-04 ENCOUNTER — Encounter: Payer: Self-pay | Admitting: Family Medicine

## 2016-09-04 DIAGNOSIS — J9809 Other diseases of bronchus, not elsewhere classified: Secondary | ICD-10-CM | POA: Insufficient documentation

## 2016-12-24 ENCOUNTER — Other Ambulatory Visit: Payer: Self-pay | Admitting: *Deleted

## 2016-12-24 MED ORDER — LOSARTAN POTASSIUM-HCTZ 50-12.5 MG PO TABS: 1.0000 | ORAL_TABLET | Freq: Every day | ORAL | 3 refills | Status: DC

## 2016-12-31 ENCOUNTER — Encounter: Payer: Self-pay | Admitting: Family Medicine

## 2016-12-31 ENCOUNTER — Ambulatory Visit (INDEPENDENT_AMBULATORY_CARE_PROVIDER_SITE_OTHER): Payer: BLUE CROSS/BLUE SHIELD | Admitting: Family Medicine

## 2016-12-31 VITALS — BP 132/78 | HR 82 | Temp 98.3°F | Resp 14 | Ht 67.0 in | Wt 277.0 lb

## 2016-12-31 DIAGNOSIS — A6920 Lyme disease, unspecified: Secondary | ICD-10-CM | POA: Diagnosis not present

## 2016-12-31 DIAGNOSIS — J3489 Other specified disorders of nose and nasal sinuses: Secondary | ICD-10-CM

## 2016-12-31 DIAGNOSIS — I10 Essential (primary) hypertension: Secondary | ICD-10-CM | POA: Diagnosis not present

## 2016-12-31 MED ORDER — DOXYCYCLINE HYCLATE 100 MG PO CAPS: 100.0000 mg | ORAL_CAPSULE | Freq: Two times a day (BID) | ORAL | 0 refills | Status: DC

## 2016-12-31 NOTE — Progress Notes (Signed)
   Subjective:    Patient ID: Kelly [name], female    DOB: Oct 29, 1961, 56 y.o.   MRN: [id number]  Patient presents for Tick Bite to Poserior R Thigh (x1 day- removed small tick from back of leg- area swollen and red)  GU tick bite to her right posterior thigh. She removed yesterday but is unclear how long it has been present. She has been walking when it was nice outside. She noticed some tenderness in her leg when she felt back there she pulled off to take. She has a moderate size red ring that has popped up and she has felt fatigued this had not had any nausea vomiting no joint pain no fever.  She's also had some sinus problems recently she was using Netty pot and other nasal sprays now she has dryness and when she does blow her nose she gets some bleeding. She's been trying to use a humidifier.  Hypertension she is taking her blood pressure medicine as described   Review Of Systems:  GEN- denies fatigue, fever, weight loss,weakness, recent illness HEENT- denies eye drainage, change in vision, nasal discharge, CVS- denies chest pain, palpitations RESP- denies SOB, cough, wheeze ABD- denies N/V, change in stools, abd pain GU- denies dysuria, hematuria, dribbling, incontinence MSK- denies joint pain, muscle aches, injury Neuro- denies headache, dizziness, syncope, seizure activity       Objective:    BP 132/78   Pulse 82   Temp 98.3 F (36.8 C) (Oral)   Resp 14   Ht 5\' 7"  (1.702 m)   Wt 277 lb (125.6 kg)   SpO2 98%   BMI 43.38 kg/m  GEN- NAD, alert and oriented x3 HEENT- PERRL, EOMI, non injected sclera, pink conjunctiva, MMM, oropharynx clear,dry nares , dry blood In left side septum Neck- Supple, no thyromegaly CVS- RRR, no murmur RESP-CTAB Skin- Right post thigh 5x6cm area of erythema center with scab 0.5cm, mild clearing around  EXT- No edema Pulses- Radial, 2+        Assessment & Plan:      Problem List Items Addressed This Visit    Essential hypertension     Controlled no changes for  Now she is able to afford out of pocket If need be we can split medication        Other Visit Diagnoses    Lyme disease    -  Primary   She has erythema migrans with tick bite, treat with doxycycline for 10 days. Some early lyme symptoms, fatigue, rash    Relevant Medications   doxycycline (VIBRAMYCIN) 100 MG capsule   Dry nares       D/C nasal sprays, can try vaseline humidifer      Note: This dictation was prepared with Dragon dictation along with smaller phrase technology. Any transcriptional errors that result from this process are unintentional.

## 2016-12-31 NOTE — Patient Instructions (Signed)
F/U as needed

## 2017-01-01 ENCOUNTER — Encounter: Payer: Self-pay | Admitting: Family Medicine

## 2017-01-01 NOTE — Assessment & Plan Note (Signed)
Controlled no changes for  Now she is able to afford out of pocket If need be we can split medication

## 2017-01-14 ENCOUNTER — Other Ambulatory Visit: Payer: Self-pay | Admitting: Obstetrics and Gynecology

## 2017-01-14 DIAGNOSIS — R928 Other abnormal and inconclusive findings on diagnostic imaging of breast: Secondary | ICD-10-CM

## 2017-01-22 ENCOUNTER — Other Ambulatory Visit: Payer: Self-pay | Admitting: *Deleted

## 2017-01-22 MED ORDER — FLUCONAZOLE 150 MG PO TABS: 150.0000 mg | ORAL_TABLET | Freq: Once | ORAL | 0 refills | Status: AC

## 2017-03-15 MED FILL — ALPRAZolam 0.25 MG TABS: 0.25 | 13 days supply | Qty: 40 | Fill #0

## 2017-04-05 ENCOUNTER — Encounter: Payer: Self-pay | Admitting: Family Medicine

## 2017-04-05 ENCOUNTER — Other Ambulatory Visit: Payer: Self-pay | Admitting: *Deleted

## 2017-04-05 MED ORDER — IBUPROFEN 800 MG PO TABS: 800.0000 mg | ORAL_TABLET | Freq: Two times a day (BID) | ORAL | 1 refills | Status: DC | PRN

## 2017-04-05 MED ORDER — IBUPROFEN 800 MG PO TABS: 800.0000 mg | ORAL_TABLET | ORAL | 1 refills | Status: DC | PRN

## 2017-04-05 MED FILL — IBUPROFEN 800 MG TAB: 800 | 23 days supply | Qty: 45 | Fill #0

## 2017-04-17 ENCOUNTER — Ambulatory Visit
Admission: RE | Admit: 2017-04-17 | Discharge: 2017-04-17 | Disposition: A | Discharge status: Home / Self Care | Payer: 59 | Source: Ambulatory Visit | Attending: Obstetrics and Gynecology | Admitting: Obstetrics and Gynecology

## 2017-04-17 DIAGNOSIS — R928 Other abnormal and inconclusive findings on diagnostic imaging of breast: Secondary | ICD-10-CM | POA: Diagnosis not present

## 2017-04-17 MED FILL — AMOX-CLAV 500-125 MG TABLET: 500-125 | 10 days supply | Qty: 20 | Fill #0

## 2017-05-09 ENCOUNTER — Encounter: Payer: Self-pay | Admitting: Family Medicine

## 2017-05-09 ENCOUNTER — Other Ambulatory Visit: Payer: Self-pay | Admitting: *Deleted

## 2017-05-09 MED ORDER — FLUCONAZOLE 150 MG PO TABS: 150.0000 mg | ORAL_TABLET | Freq: Once | ORAL | 0 refills | Status: DC

## 2017-05-09 MED ORDER — FLUCONAZOLE 150 MG PO TABS: 150.0000 mg | ORAL_TABLET | Freq: Once | ORAL | 0 refills | Status: AC

## 2017-05-09 MED FILL — FLUCONAZOLE 150 MG TABLET: 150 | 1 days supply | Qty: 1 | Fill #0

## 2017-05-17 ENCOUNTER — Encounter: Payer: Self-pay | Admitting: Family Medicine

## 2017-05-17 DIAGNOSIS — K649 Unspecified hemorrhoids: Secondary | ICD-10-CM

## 2017-05-21 ENCOUNTER — Encounter: Payer: Self-pay | Admitting: Gastroenterology

## 2017-05-31 MED FILL — IBUPROFEN 800 MG TAB: 800 | 23 days supply | Qty: 45 | Fill #1

## 2017-06-03 MED FILL — ALPRAZolam 0.25 MG TABS: 0.25 | 13 days supply | Qty: 40 | Fill #0

## 2017-06-04 ENCOUNTER — Encounter: Payer: Self-pay | Admitting: Family Medicine

## 2017-06-07 ENCOUNTER — Encounter: Payer: Self-pay | Admitting: Family Medicine

## 2017-06-07 ENCOUNTER — Ambulatory Visit (INDEPENDENT_AMBULATORY_CARE_PROVIDER_SITE_OTHER): Payer: 59 | Admitting: Family Medicine

## 2017-06-07 VITALS — BP 128/84 | HR 93 | Temp 98.2°F | Resp 18 | Wt 265.0 lb

## 2017-06-07 DIAGNOSIS — K219 Gastro-esophageal reflux disease without esophagitis: Secondary | ICD-10-CM | POA: Diagnosis not present

## 2017-06-07 DIAGNOSIS — R053 Chronic cough: Secondary | ICD-10-CM

## 2017-06-07 DIAGNOSIS — R05 Cough: Secondary | ICD-10-CM | POA: Diagnosis not present

## 2017-06-07 DIAGNOSIS — N760 Acute vaginitis: Secondary | ICD-10-CM

## 2017-06-07 LAB — WET PREP FOR TRICH, YEAST, CLUE
Clue Cells Wet Prep HPF POC: NONE SEEN
Trich, Wet Prep: NONE SEEN

## 2017-06-07 MED ORDER — CLOTRIMAZOLE-BETAMETHASONE 1-0.05 % EX CREA: 1.0000 "application " | TOPICAL_CREAM | Freq: Two times a day (BID) | CUTANEOUS | 0 refills | Status: DC

## 2017-06-07 MED ORDER — ESOMEPRAZOLE MAGNESIUM 40 MG PO CPDR: 40.0000 mg | DELAYED_RELEASE_CAPSULE | Freq: Every day | ORAL | 3 refills | Status: DC

## 2017-06-07 MED FILL — CLOTRIMAZOLE-BETAMETHASONE: 1-0.05 | 10 days supply | Qty: 30 | Fill #0

## 2017-06-07 NOTE — Patient Instructions (Signed)
F/U as previous  Start neixum Use the cream

## 2017-06-07 NOTE — Progress Notes (Signed)
   Subjective:    Patient ID: Star Age, female    DOB: 10-17-1961, 56 y.o.   MRN: [id number]  Patient presents for Acute Visit (red & itchy & dry)    Pt here with vaginal itching started a she took antibiotics from her dentist in late June.She treated with diflucan. Symptoms did not improve she went to HD, had screening STD check , told yeast infection, she took 2 more diflucan, still has itching, redness and feels dry at times. NO UTI symptoms, no vaginal bleeding No abdominal pain  She also continues to have dry cough, worse in the morning. Non productive does not feel sick, has history of GERD was on nexium   Review Of Systems:  GEN- denies fatigue, fever, weight loss,weakness, recent illness HEENT- denies eye drainage, change in vision, nasal discharge, CVS- denies chest pain, palpitations RESP- denies SOB, +cough, wheeze ABD- denies N/V, change in stools, abd pain GU- denies dysuria, hematuria, dribbling, incontinence MSK- denies joint pain, muscle aches, injury Neuro- denies headache, dizziness, syncope, seizure activity       Objective:    BP 128/84   Pulse 93   Temp 98.2 F (36.8 C)   Resp 18   Wt 265 lb (120.2 kg)   SpO2 97%   BMI 41.50 kg/m  GEN- NAD, alert and oriented x3 CVS- RRR, no murmur RESP-CTAB ABD-NABS,soft,NT,ND GU- normal external genitalia,  erythema of labia majora with irritated apperance, vaginal mucosa pink and moist, +white discharge, no uterus, no cervix EXT- No edema Pulses- Radial 2+        Assessment & Plan:      Problem List Items Addressed This Visit    GERD (gastroesophageal reflux disease)    Trial of Nexium again to see if this improves cough If not refer to pulmonary  Non smoker       Relevant Medications   esomeprazole (NEXIUM) 40 MG capsule    Other Visit Diagnoses    Acute vaginitis    -  Primary   residual yeast on wet prep, significant irritation in general externally. She is menopausal. Start with  anti-fungal/steroid, if not improved trial of topical estrogen   Relevant Orders   WET PREP FOR [place], YEAST, CLUE (Completed)   Chronic cough          Note: This dictation was prepared with Dragon dictation along with smaller phrase technology. Any transcriptional errors that result from this process are unintentional.

## 2017-06-09 NOTE — Assessment & Plan Note (Signed)
Trial of Nexium again to see if this improves cough If not refer to pulmonary  Non smoker

## 2017-06-11 ENCOUNTER — Ambulatory Visit: Payer: BLUE CROSS/BLUE SHIELD | Admitting: Family Medicine

## 2017-07-01 ENCOUNTER — Encounter: Payer: Self-pay | Admitting: Family Medicine

## 2017-07-02 MED ORDER — TERCONAZOLE 80 MG VA SUPP: 80.0000 mg | Freq: Every day | VAGINAL | 0 refills | Status: DC

## 2017-07-03 ENCOUNTER — Other Ambulatory Visit: Payer: Self-pay | Admitting: *Deleted

## 2017-07-03 MED ORDER — TERCONAZOLE 80 MG VA SUPP: 80.0000 mg | Freq: Every day | VAGINAL | 0 refills | Status: DC

## 2017-07-05 ENCOUNTER — Emergency Department (HOSPITAL_BASED_OUTPATIENT_CLINIC_OR_DEPARTMENT_OTHER)
Admission: EM | Admit: 2017-07-05 | Discharge: 2017-07-05 | Disposition: A | Discharge status: Home / Self Care | Payer: 59 | Attending: Emergency Medicine | Admitting: Emergency Medicine

## 2017-07-05 ENCOUNTER — Encounter (HOSPITAL_BASED_OUTPATIENT_CLINIC_OR_DEPARTMENT_OTHER): Payer: Self-pay | Admitting: Emergency Medicine

## 2017-07-05 DIAGNOSIS — Z87891 Personal history of nicotine dependence: Secondary | ICD-10-CM | POA: Diagnosis not present

## 2017-07-05 DIAGNOSIS — Z79899 Other long term (current) drug therapy: Secondary | ICD-10-CM | POA: Insufficient documentation

## 2017-07-05 DIAGNOSIS — M79672 Pain in left foot: Secondary | ICD-10-CM | POA: Insufficient documentation

## 2017-07-05 MED ORDER — HYDROCODONE-ACETAMINOPHEN 5-325 MG PO TABS: 1.0000 | ORAL_TABLET | ORAL | 0 refills | Status: DC | PRN

## 2017-07-05 MED FILL — HYDROCODON-APAP 5-325: 5-325 | 3 days supply | Qty: 18 | Fill #0
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
## 2017-07-05 NOTE — ED Provider Notes (Addendum)
[place] [facility] Provider Note: Kelly Spurling, MD, FACEP  CSN: [id number] MRN: [id number] ARRIVAL: 07/05/17 at [place]: [facility]   HISTORY OF PRESENT ILLNESS  07/05/17 5:46 AM Kelly [name] is a 56 y.o. female who developed the gradual onset of pain in her left foot yesterday evening. She denies an injury or sudden onset of pain. The pain is located throughout the arch of the foot dorsally and on the plantar surface. It is not limited to the plantar fascia. There is no associated deformity, redness or warmth. She rates her pain as a 10 out of 10 at its worst. It is worse with movement or weightbearing. She has been taking 800 milligram ibuprofen without adequate relief.  Consultation with the [facility] state controlled substances database reveals the patient has received no opioid prescriptions in the past year.   Past Medical History:  Diagnosis Date  . Allergy   . Anxiety   . 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 Caren Macadam)  . Depression   . GERD (gastroesophageal reflux disease)   . Headache disorder   . Hyperlipidemia   . IBS (irritable bowel syndrome)   . Obesity   . Salivary calculus   . Seasonal allergies     Past Surgical History:  Procedure Laterality Date  . ABDOMINAL HYSTERECTOMY  2001   partial hysterectomy  . REDUCTION MAMMAPLASTY Bilateral 04/25/2015  . SALIVARY GLAND SURGERY    . TEMPOROMANDIBULAR JOINT SURGERY     3 times    Family History  Problem Relation Age of Onset  . Heart attack Father        MI at age 4  . Coronary artery disease Brother   . Diabetes Brother   . Coronary artery disease Brother   . Lung cancer Mother 49       +TOBACCO, had quit many years before diagnosis  . COPD Brother        ?Melanoma  . Lung cancer Unknown   . Hypertension Unknown     Social History  Substance Use Topics  . Smoking status: Former Smoker    Years: 7.00    Types:  Cigarettes  . Smokeless tobacco: Never Used     Comment: 1.5 pack a week  . Alcohol use 0.0 oz/week     Comment: Very seldom    Prior to Admission medications   Medication Sig Start Date End Date Taking? Authorizing Provider  ALPRAZolam Duanne Moron) 0.25 MG tablet TAKE 1 TABLET TWICE A DAY AS NEEDED FOR ANXIETY 04/09/16   Alycia Rossetti, MD  betamethasone valerate (VALISONE) 0.1 % cream  11/28/12   [provider]  clobetasol (OLUX) 0.05 % topical foam Apply topically 2 (two) times daily. Apply sparingly once daily to scalp as needed 11/29/15   Alycia Rossetti, MD  clotrimazole-betamethasone (LOTRISONE) cream Apply 1 application topically 2 (two) times daily. 06/07/17   Alycia Rossetti, MD  esomeprazole (NEXIUM) 40 MG capsule Take 1 capsule (40 mg total) by mouth daily. 06/07/17   Alycia Rossetti, MD  fluticasone [facility]) 50 MCG/ACT nasal spray USE 2 SPRAYS IN [facility]       NOSTRIL DAILY 09/19/15   Susy Frizzle, MD  hydrocortisone cream 0.5 % Apply 1 application topically 2 (two) times daily. Please dispense with rectal tip. 01/09/16   Alycia Rossetti, MD  ibuprofen (ADVIL,MOTRIN) 800 MG tablet Take 1 tablet (800  mg total) by mouth 2 (two) times daily as needed. 04/05/17   [place], Modena Nunnery, MD  losartan-hydrochlorothiazide [facility]) 50-12.5 MG tablet Take 1 tablet by mouth daily. 12/24/16   Alycia Rossetti, MD  terconazole (TERAZOL 3) 80 MG vaginal suppository Place 1 suppository (80 mg total) vaginally at bedtime. 07/03/17   Alycia Rossetti, MD    Allergies Codeine; Topamax [topiramate]; and Maxalt [rizatriptan]   REVIEW OF SYSTEMS  Negative except as noted here or in the History of Present Illness.   PHYSICAL EXAMINATION  Initial Vital Signs Blood pressure (!) 140/91, pulse 98, temperature 98.1 F (36.7 C), temperature source Oral, resp. rate 18, height 5\' 7"  (1.702 m), weight 108.9 kg (240 lb), SpO2 97 %.  Examination General: Well-developed, well-nourished female in no  acute distress; appearance consistent with age of record HENT: normocephalic; atraumatic Eyes: pupils equal, round and reactive to light; extraocular muscles intact Neck: supple Heart: regular rate and rhythm Lungs: clear to auscultation bilaterally Abdomen: soft; nondistended; nontender; bowel sounds present Extremities: No deformity; full range of motion; pulses normal; tenderness of left foot without deformity, erythema or warmth, left foot distally neurovascularly intact with intact tendon function Neurologic: Awake, alert and oriented; motor function intact in all extremities and symmetric; no facial droop Skin: Warm and dry Psychiatric: Normal mood and affect   RESULTS  Summary of this visit's results, reviewed by myself:   EKG Interpretation  Date/Time:    Ventricular Rate:    PR Interval:    QRS Duration:   QT Interval:    QTC Calculation:   R Axis:     Text Interpretation:        Laboratory Studies: No results found for this or any previous visit (from the past 24 hour(s)). Imaging Studies: No results found.  ED COURSE  Nursing notes and initial vitals signs, including pulse oximetry, reviewed.  Vitals:   07/05/17 0535  BP: (!) 140/91  Pulse: 98  Resp: 18  Temp: 98.1 F (36.7 C)  TempSrc: Oral  SpO2: 97%  Weight: 108.9 kg (240 lb)  Height: 5\' 7"  (1.702 m)   We'll place in postop shoe and have her follow-up with herpes titers Dr. Milinda Pointer.  PROCEDURES    ED DIAGNOSES     ICD-10-CM   1. Acute pain of left foot M79.672        Shanon Rosser, MD 07/05/17 6578    Shanon Rosser, MD 07/05/17 [phone]

## 2017-07-08 ENCOUNTER — Other Ambulatory Visit: Payer: Self-pay | Admitting: Family Medicine

## 2017-07-09 ENCOUNTER — Other Ambulatory Visit: Payer: Self-pay

## 2017-07-09 MED ORDER — CLOTRIMAZOLE-BETAMETHASONE 1-0.05 % EX CREA: 1.0000 "application " | TOPICAL_CREAM | Freq: Two times a day (BID) | CUTANEOUS | 0 refills | Status: DC

## 2017-07-09 MED FILL — CLOTRIMAZOLE-BETAMETHASONE: 1-0.05 | 20 days supply | Qty: 30 | Fill #0

## 2017-07-09 NOTE — Telephone Encounter (Signed)
Refill appropriate 

## 2017-07-12 ENCOUNTER — Encounter: Payer: Self-pay | Admitting: Gastroenterology

## 2017-07-16 ENCOUNTER — Ambulatory Visit: Payer: 59 | Admitting: Gastroenterology

## 2017-07-23 ENCOUNTER — Encounter: Payer: Self-pay | Admitting: Family Medicine

## 2017-07-23 MED ORDER — IBUPROFEN 800 MG PO TABS: 800.0000 mg | ORAL_TABLET | Freq: Two times a day (BID) | ORAL | 1 refills | Status: DC | PRN

## 2017-07-23 MED FILL — IBUPROFEN 800 MG TABS: 800 | 23 days supply | Qty: 45 | Fill #0

## 2017-07-24 MED FILL — ALPRAZolam 0.25 MG TABS: 0.25 | 13 days supply | Qty: 40 | Fill #0

## 2017-08-19 ENCOUNTER — Ambulatory Visit: Payer: 59 | Admitting: Gastroenterology

## 2017-08-22 DIAGNOSIS — R319 Hematuria, unspecified: Secondary | ICD-10-CM | POA: Diagnosis not present

## 2017-08-22 DIAGNOSIS — N760 Acute vaginitis: Secondary | ICD-10-CM | POA: Diagnosis not present

## 2017-08-22 DIAGNOSIS — N390 Urinary tract infection, site not specified: Secondary | ICD-10-CM | POA: Diagnosis not present

## 2017-08-23 ENCOUNTER — Ambulatory Visit: Payer: 59 | Admitting: Gastroenterology

## 2017-08-30 ENCOUNTER — Ambulatory Visit: Payer: 59 | Admitting: Gastroenterology

## 2017-09-02 MED FILL — IBUPROFEN 800 MG TABS: 800 | 23 days supply | Qty: 45 | Fill #1

## 2017-09-04 MED FILL — ALPRAZolam 0.25 MG TABS: 0.25 | 14 days supply | Qty: 40 | Fill #0

## 2017-09-06 ENCOUNTER — Other Ambulatory Visit: Payer: Self-pay | Admitting: Obstetrics and Gynecology

## 2017-09-06 DIAGNOSIS — N6489 Other specified disorders of breast: Secondary | ICD-10-CM

## 2017-09-23 ENCOUNTER — Ambulatory Visit: Payer: 59 | Admitting: Gastroenterology

## 2017-10-07 ENCOUNTER — Encounter: Payer: Self-pay | Admitting: Family Medicine

## 2017-10-07 ENCOUNTER — Ambulatory Visit: Payer: 59 | Admitting: Family Medicine

## 2017-10-07 ENCOUNTER — Other Ambulatory Visit: Payer: Self-pay

## 2017-10-07 ENCOUNTER — Ambulatory Visit (INDEPENDENT_AMBULATORY_CARE_PROVIDER_SITE_OTHER): Payer: 59 | Admitting: Family Medicine

## 2017-10-07 VITALS — BP 138/78 | HR 80 | Temp 98.1°F | Resp 16 | Ht 67.0 in | Wt 254.0 lb

## 2017-10-07 DIAGNOSIS — J0100 Acute maxillary sinusitis, unspecified: Secondary | ICD-10-CM | POA: Diagnosis not present

## 2017-10-07 DIAGNOSIS — J209 Acute bronchitis, unspecified: Secondary | ICD-10-CM

## 2017-10-07 MED ORDER — AMOXICILLIN 875 MG PO TABS: 875.0000 mg | ORAL_TABLET | Freq: Two times a day (BID) | ORAL | 0 refills | Status: DC

## 2017-10-07 MED ORDER — ALBUTEROL SULFATE HFA 108 (90 BASE) MCG/ACT IN AERS: 2.0000 | INHALATION_SPRAY | Freq: Four times a day (QID) | RESPIRATORY_TRACT | 0 refills | Status: DC | PRN

## 2017-10-07 MED FILL — AMOXICILLIN 875 MG TABLET: 875 | 7 days supply | Qty: 14 | Fill #0

## 2017-10-07 NOTE — Patient Instructions (Addendum)
Take delsym  Use inhaler as needed Take antibiotics  Use flonase  F/U as needed

## 2017-10-07 NOTE — Progress Notes (Signed)
   Subjective:    Patient ID: Kelly [name], female    DOB: December 28, 1960, 56 y.o.   MRN: [id number]  Patient presents for Illness (x1 week- sinus pressure, hoarse, SOB on exertion, wheexing)   Cough with congestion, sore throat  production for past week, wheezing, tightness in chest. Headache,, minimal sinus drainage.Subjective fever with chills over the weekend. Taking Dayquil, ibuprofen.     [place] continues to have intermittent irritation to her eye.  They did give her some eyedrops in the past.  Recommend that she follow-up with them with regards to this.  Review Of Systems:  GEN- denies fatigue, fever, weight loss,weakness, recent illness HEENT- denies eye drainage, change in vision, nasal discharge, CVS- denies chest pain, palpitations RESP- denies SOB, cough, wheeze ABD- denies N/V, change in stools, abd pain GU- denies dysuria, hematuria, dribbling, incontinence MSK- denies joint pain, muscle aches, injury Neuro- denies headache, dizziness, syncope, seizure activity       Objective:    BP 138/78   Pulse 80   Temp 98.1 F (36.7 C) (Oral)   Resp 16   Ht 5\' 7"  (1.702 m)   Wt 254 lb (115.2 kg)   SpO2 96%   BMI 39.78 kg/m  GEN- NAD, alert and oriented x3 HEENT- PERRL, EOMI, non injected sclera, pink conjunctiva, MMM, oropharynx mild injection, TM clear bilat no effusion,  Mil  maxillary sinus tenderness, inflammed turbinates,  Nasal drainage  Neck- Supple, no LAD CVS- RRR, no murmur RESP-CTAB EXT- No edema Pulses- Radial 2+           Assessment & Plan:      Problem List Items Addressed This Visit    None    Visit Diagnoses    Acute maxillary sinusitis, recurrence not specified    -  Primary   Treat for sinisutis leading to bronchitis, amoxicillin, albuterol inhaler, delsym, flonase    Relevant Medications   amoxicillin (AMOXIL) 875 MG tablet   Acute bronchitis, unspecified organism          Note: This dictation was prepared with Dragon  dictation along with smaller phrase technology. Any transcriptional errors that result from this process are unintentional.

## 2017-10-08 ENCOUNTER — Ambulatory Visit: Payer: 59 | Admitting: Family Medicine

## 2017-10-11 ENCOUNTER — Ambulatory Visit: Payer: 59 | Admitting: Family Medicine

## 2017-10-12 ENCOUNTER — Emergency Department (HOSPITAL_COMMUNITY): Payer: 59

## 2017-10-12 ENCOUNTER — Encounter (HOSPITAL_COMMUNITY): Payer: Self-pay | Admitting: Emergency Medicine

## 2017-10-12 ENCOUNTER — Other Ambulatory Visit: Payer: Self-pay

## 2017-10-12 ENCOUNTER — Emergency Department (HOSPITAL_COMMUNITY)
Admission: EM | Admit: 2017-10-12 | Discharge: 2017-10-12 | Disposition: A | Discharge status: Home / Self Care | Payer: 59 | Attending: Emergency Medicine | Admitting: Emergency Medicine

## 2017-10-12 DIAGNOSIS — I10 Essential (primary) hypertension: Secondary | ICD-10-CM | POA: Diagnosis not present

## 2017-10-12 DIAGNOSIS — Z79899 Other long term (current) drug therapy: Secondary | ICD-10-CM | POA: Insufficient documentation

## 2017-10-12 DIAGNOSIS — Z87891 Personal history of nicotine dependence: Secondary | ICD-10-CM | POA: Diagnosis not present

## 2017-10-12 DIAGNOSIS — M25551 Pain in right hip: Secondary | ICD-10-CM | POA: Diagnosis not present

## 2017-10-12 MED ORDER — MELOXICAM 7.5 MG PO TABS: 7.5000 mg | ORAL_TABLET | Freq: Every day | ORAL | 0 refills | Status: DC | PRN

## 2017-10-12 MED ORDER — HYDROMORPHONE HCL 1 MG/ML IJ SOLN
1.0000 mg | Freq: Once | INTRAMUSCULAR | Status: AC
  Administered 2017-10-12: 1 mg via INTRAMUSCULAR
  Filled 2017-10-12: qty 1

## 2017-10-12 MED ORDER — OXYCODONE-ACETAMINOPHEN 5-325 MG PO TABS: 2.0000 | ORAL_TABLET | ORAL | 0 refills | Status: DC | PRN

## 2017-10-12 MED ORDER — DEXAMETHASONE 4 MG PO TABS
8.0000 mg | ORAL_TABLET | Freq: Once | ORAL | Status: AC
  Administered 2017-10-12: 8 mg via ORAL
  Filled 2017-10-12: qty 2

## 2017-10-12 MED ORDER — OXYCODONE-ACETAMINOPHEN 5-325 MG PO TABS: 1.0000 | ORAL_TABLET | ORAL | 0 refills | Status: DC | PRN

## 2017-10-12 MED ORDER — LORAZEPAM 1 MG PO TABS
1.0000 mg | ORAL_TABLET | Freq: Once | ORAL | Status: AC
  Administered 2017-10-12: 1 mg via ORAL
  Filled 2017-10-12: qty 1

## 2017-10-12 MED ORDER — KETOROLAC TROMETHAMINE 30 MG/ML IJ SOLN
30.0000 mg | Freq: Once | INTRAMUSCULAR | Status: AC
  Administered 2017-10-12: 30 mg via INTRAMUSCULAR
  Filled 2017-10-12: qty 1

## 2017-10-12 NOTE — ED Notes (Signed)
Pt alert & oriented x4. Patient given discharge instructions, paperwork & prescription(s). Patient verbalized understanding. Pt left department in wheelchair escorted by staff. Pt left w/ no further questions. 

## 2017-10-12 NOTE — ED Triage Notes (Signed)
Pt states having right leg/groin pain starting yesterday. No new injuries.

## 2017-10-13 ENCOUNTER — Telehealth: Payer: Self-pay
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
## 2017-10-13 NOTE — ED Provider Notes (Signed)
[facility] EMERGENCY DEPARTMENT Provider Note   CSN: [id number] Arrival date & time: 10/12/17  1610     History   Chief Complaint Chief Complaint  Patient presents with  . Leg Pain    HPI Kelly [name] is a 56 y.o. female.  HPI   14yF with R groin pain. Onset yesterday. Initially bearable but progressing to point that now difficult to bear weight on R leg. Denies trauma/strain. No swelling/rash. No fever. Reports R foot pain about a week ago that resolved.   Past Medical History:  Diagnosis Date  . Allergy   . Anxiety   . 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 Caren Macadam)  . Depression   . GERD (gastroesophageal reflux disease)   . Headache disorder   . Hyperlipidemia   . IBS (irritable bowel syndrome)   . Obesity   . Salivary calculus   . Seasonal allergies     Patient Active Problem List   Diagnosis Date Noted  . Bronchial stenosis   . Renal cell carcinoma ([place])   . Essential hypertension 11/29/2015  . 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 Hemorrhoids, internal 03/04/2013  . External hemorrhoids with pain 03/04/2013  . Depression   . Seasonal allergies   . Irritable bowel syndrome with constipation >> diarrhea   . Headache disorder   . GERD (gastroesophageal reflux disease) 01/28/2013  . Chest pain 08/08/2012  . Hyperlipidemia 08/08/2012  . Obesity, Class III, BMI 40-49.9 (morbid obesity) ([place]) 08/08/2012    Past Surgical History:  Procedure Laterality Date  . ABDOMINAL HYSTERECTOMY  2001   partial hysterectomy  . REDUCTION MAMMAPLASTY Bilateral 04/25/2015  . SALIVARY GLAND SURGERY    . TEMPOROMANDIBULAR JOINT SURGERY     3 times    OB History    No data available       Home Medications    Prior to Admission medications   Medication Sig Start Date End Date Taking? Authorizing Provider  ALPRAZolam Duanne Moron) 0.25 MG tablet TAKE 1 TABLET TWICE A DAY AS NEEDED FOR ANXIETY 04/09/16  Yes [place], Modena Nunnery, MD  amoxicillin (AMOXIL) 875 MG tablet Take 1 tablet (875 mg total) by mouth 2 (two) times daily. 10/07/17  Yes [place], Modena Nunnery, MD  clobetasol (OLUX) 0.05 % topical foam Apply topically 2 (two) times daily. Apply sparingly once daily to scalp as needed 11/29/15  Yes [place], Modena Nunnery, MD  fluticasone [facility]) 50 MCG/ACT nasal spray USE 2 SPRAYS IN [facility]       NOSTRIL DAILY 09/19/15  Yes Susy Frizzle, MD  ibuprofen (ADVIL,MOTRIN) 800 MG tablet Take 1 tablet (800 mg total) by mouth 2 (two) times daily as needed. 07/23/17  Yes [place], Modena Nunnery, MD  losartan-hydrochlorothiazide (HYZAAR) 50-12.5 MG tablet Take 1 tablet by mouth daily. Patient taking differently: Take 0.5 tablets by mouth daily.  12/24/16  Yes [place], Modena Nunnery, MD  naphazoline-pheniramine (ALLERGY EYE) 0.025-0.3 % ophthalmic solution Place 1 drop into both eyes 4 (four) times daily as needed for eye irritation.   Yes [provider]  albuterol (PROVENTIL HFA;VENTOLIN HFA) 108 (90 Base) MCG/ACT inhaler Inhale 2 puffs into the lungs every 6 (six) hours as needed for wheezing or shortness of breath. 10/07/17   Alycia Rossetti, MD  esomeprazole (NEXIUM) 40 MG capsule Take 1 capsule (40 mg total) by mouth daily. Patient not taking: Reported on 10/12/2017 06/07/17   Alycia Rossetti, MD  meloxicam (  MOBIC) 7.5 MG tablet Take 1 tablet (7.5 mg total) by mouth daily as needed for pain. 10/12/17   Virgel Manifold, MD  oxyCODONE-acetaminophen (PERCOCET/ROXICET) 5-325 MG tablet Take 2 tablets by mouth every 4 (four) hours as needed for severe pain. 10/12/17   Virgel Manifold, MD  oxyCODONE-acetaminophen (PERCOCET/ROXICET) 5-325 MG tablet Take 1-2 tablets by mouth every 4 (four) hours as needed. 10/12/17   Virgel Manifold, MD    Family History Family History  Problem Relation Age of Onset  . Heart attack Father        MI at age 24  . Coronary artery disease Brother   . Diabetes Brother   . Coronary artery disease Brother   . Lung  cancer Mother 77       +TOBACCO, had quit many years before diagnosis  . COPD Brother        ?Melanoma  . Lung cancer Unknown   . Hypertension Unknown     Social History Social History   Tobacco Use  . Smoking status: Former Smoker    Years: 7.00    Types: Cigarettes  . Smokeless tobacco: Never Used  . Tobacco comment: 1.5 pack a week  Substance Use Topics  . Alcohol use: Yes    Alcohol/week: 0.0 oz    Comment: Very seldom  . Drug use: No     Allergies   Codeine; Topamax [topiramate]; and Maxalt [rizatriptan]   Review of Systems Review of Systems  All systems reviewed and negative, other than as noted in HPI.  Physical Exam Updated Vital Signs BP 126/82 (BP Location: Left Arm)   Pulse (!) 101   Temp 98.2 F (36.8 C) (Oral)   Resp 16   Ht 5\' 7"  (1.702 m)   Wt 115.2 kg (254 lb)   SpO2 91%   BMI 39.78 kg/m   Physical Exam  Constitutional: She appears well-developed and well-nourished. No distress.  HENT:  Head: Normocephalic and atraumatic.  Eyes: Conjunctivae are normal. Right eye exhibits no discharge. Left eye exhibits no discharge.  Neck: Neck supple.  Cardiovascular: Normal rate, regular rhythm and normal heart sounds. Exam reveals no gallop and no friction rub.  No murmur heard. Pulmonary/Chest: Effort normal and breath sounds normal. No respiratory distress.  Abdominal: Soft. She exhibits no distension. There is no tenderness.  Musculoskeletal: She exhibits no edema or tenderness.  Normal positioning of RLE while laying in bed. Symmetric as compared to L. No rash. Palpable DP pulse. TTP adductor musculature proximal R thigh. No skin changes.   Neurological: She is alert.  Skin: Skin is warm and dry.  Psychiatric: She has a normal mood and affect. Her behavior is normal. Thought content normal.  Nursing note and vitals reviewed.    ED Treatments / Results  Labs (all labs ordered are listed, but only abnormal results are displayed) Labs Reviewed  - No data to display  EKG  EKG Interpretation None       Radiology Dg Hip Unilat W Or Wo Pelvis 2-3 Views Right  Result Date: 10/12/2017 CLINICAL DATA:  56 year old female with right groin pain. EXAM: DG HIP (WITH OR WITHOUT PELVIS) 2-3V RIGHT COMPARISON:  None. FINDINGS: There is no evidence of hip fracture or dislocation. There is no evidence of arthropathy or other focal bone abnormality. IMPRESSION: Negative. Electronically Signed   By: Kristopher Oppenheim M.D.   On: 10/12/2017 17:45    Procedures Procedures (including critical care time)  Medications Ordered in ED Medications  HYDROmorphone (DILAUDID) injection  1 mg (1 mg Intramuscular Given 10/12/17 1746)  ketorolac (TORADOL) 30 MG/ML injection 30 mg (30 mg Intramuscular Given 10/12/17 1745)  LORazepam (ATIVAN) tablet 1 mg (1 mg Oral Given 10/12/17 1746)  dexamethasone (DECADRON) tablet 8 mg (8 mg Oral Given 10/12/17 1746)     Initial Impression / Assessment and Plan / ED Course  I have reviewed the triage vital signs and the nursing notes.  Pertinent labs & imaging results that were available during my care of the patient were reviewed by me and considered in my medical decision making (see chart for details).     56yF with atraumatic R hip/groin pain. No trauma. Possibly gait change with recent foot pain? No rash. No swelling. HD stable. Imaging ok. Doubt DVT, arterial occlusion, infectious, etc.   Plan symptomatic tx at this point. It has been determined that no acute conditions requiring further emergency intervention are present at this time. The patient has been advised of the diagnosis and plan. I reviewed any labs and imaging including any potential incidental findings. We have discussed signs and symptoms that warrant return to the ED and they are listed in the discharge instructions.    Final Clinical Impressions(s) / ED Diagnoses   Final diagnoses:  Right hip pain    ED Discharge Orders        Ordered     oxyCODONE-acetaminophen (PERCOCET/ROXICET) 5-325 MG tablet  Every 4 hours PRN     10/12/17 1943    meloxicam (MOBIC) 7.5 MG tablet  Daily PRN     10/12/17 1943    oxyCODONE-acetaminophen (PERCOCET/ROXICET) 5-325 MG tablet  Every 4 hours PRN     10/12/17 1943       Virgel Manifold, MD 10/13/17 1934

## 2017-10-14 ENCOUNTER — Ambulatory Visit: Payer: 59 | Admitting: Gastroenterology

## 2017-10-15 ENCOUNTER — Encounter: Payer: Self-pay | Admitting: Family Medicine

## 2017-10-16 ENCOUNTER — Encounter: Payer: Self-pay | Admitting: Family Medicine

## 2017-10-16 ENCOUNTER — Ambulatory Visit: Payer: 59 | Admitting: Family Medicine

## 2017-10-16 ENCOUNTER — Other Ambulatory Visit: Payer: Self-pay

## 2017-10-16 ENCOUNTER — Encounter: Payer: Self-pay | Admitting: Gastroenterology

## 2017-10-16 VITALS — BP 138/82 | HR 86 | Temp 98.1°F | Resp 16 | Ht 67.0 in | Wt 246.0 lb

## 2017-10-16 DIAGNOSIS — E669 Obesity, unspecified: Secondary | ICD-10-CM | POA: Diagnosis not present

## 2017-10-16 DIAGNOSIS — I10 Essential (primary) hypertension: Secondary | ICD-10-CM | POA: Diagnosis not present

## 2017-10-16 DIAGNOSIS — M2540 Effusion, unspecified joint: Secondary | ICD-10-CM

## 2017-10-16 DIAGNOSIS — M2550 Pain in unspecified joint: Secondary | ICD-10-CM | POA: Diagnosis not present

## 2017-10-16 DIAGNOSIS — E782 Mixed hyperlipidemia: Secondary | ICD-10-CM | POA: Diagnosis not present

## 2017-10-16 DIAGNOSIS — R81 Glycosuria: Secondary | ICD-10-CM

## 2017-10-16 MED ORDER — METHYLPREDNISOLONE 4 MG PO TBPK: ORAL_TABLET | ORAL | 0 refills | Status: DC

## 2017-10-16 MED ORDER — FLUCONAZOLE 150 MG PO TABS: 150.0000 mg | ORAL_TABLET | Freq: Once | ORAL | 1 refills | Status: AC

## 2017-10-16 MED FILL — FLUCONAZOLE 150 MG TABLET: 150 | 1 days supply | Qty: 1 | Fill #0

## 2017-10-16 MED FILL — METHYLPREDNISOLONE 4 MG TAB: 4 | 6 days supply | Qty: 21 | Fill #0

## 2017-10-16 NOTE — Telephone Encounter (Signed)
PATIENT RESCHEDULED AND ADDED TO A CANCELLATION LIST
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
## 2017-10-16 NOTE — Assessment & Plan Note (Signed)
No BP meds this am, typically controlled Check fasting labs and A1C concern for diabetes mellitus with glucose in urine at GYN office  For her intemittant fleeting joint pain ? Inflammatory arthritis or some type of autoimmune disorder Doubt chronic lyme Check inflammatory markers, CBC, RF, ANA Send to rheumatology  Given Medrol dose pak, hold NSAIDS, avoid use of norco/oxycodone unless severe

## 2017-10-16 NOTE — Patient Instructions (Addendum)
Take the steroid dose pak I will call with lab results  Rheumatology referral Hold on taking hydrocodone or oxycodone unless severe pain  F/u PENDING RESULTS

## 2017-10-17 ENCOUNTER — Other Ambulatory Visit: Payer: Self-pay | Admitting: *Deleted

## 2017-10-17 ENCOUNTER — Encounter: Payer: Self-pay | Admitting: Family Medicine

## 2017-10-17 DIAGNOSIS — M2540 Effusion, unspecified joint: Secondary | ICD-10-CM

## 2017-10-17 DIAGNOSIS — M2550 Pain in unspecified joint: Secondary | ICD-10-CM

## 2017-10-17 LAB — CBC WITH DIFFERENTIAL/PLATELET
BASOS PCT: 0.6 %
Basophils Absolute: 46 cells/uL (ref 0–200)
EOS PCT: 3 %
Eosinophils Absolute: 231 cells/uL (ref 15–500)
HCT: 43 % (ref 35.0–45.0)
Hemoglobin: 14.5 g/dL (ref 11.7–15.5)
LYMPHS ABS: 2248 {cells}/uL (ref 850–3900)
MCH: 27.4 pg (ref 27.0–33.0)
MCHC: 33.7 g/dL (ref 32.0–36.0)
MCV: 81.1 fL (ref 80.0–100.0)
MPV: 10.2 fL (ref 7.5–12.5)
Monocytes Relative: 6.6 %
NEUTROS PCT: 60.6 %
Neutro Abs: 4666 cells/uL (ref 1500–7800)
PLATELETS: 376 10*3/uL (ref 140–400)
RBC: 5.3 10*6/uL — AB (ref 3.80–5.10)
RDW: 13.3 % (ref 11.0–15.0)
TOTAL LYMPHOCYTE: 29.2 %
WBC mixed population: 508 cells/uL (ref 200–950)
WBC: 7.7 10*3/uL (ref 3.8–10.8)

## 2017-10-17 LAB — COMPREHENSIVE METABOLIC PANEL
AG Ratio: 1.2 (calc) (ref 1.0–2.5)
ALT: 64 U/L — ABNORMAL HIGH (ref 6–29)
AST: 52 U/L — ABNORMAL HIGH (ref 10–35)
Albumin: 3.9 g/dL (ref 3.6–5.1)
Alkaline phosphatase (APISO): 112 U/L (ref 33–130)
BUN: 11 mg/dL (ref 7–25)
CALCIUM: 9.5 mg/dL (ref 8.6–10.4)
CO2: 26 mmol/L (ref 20–32)
CREATININE: 0.65 mg/dL (ref 0.50–1.05)
Chloride: 100 mmol/L (ref 98–110)
GLOBULIN: 3.3 g/dL (ref 1.9–3.7)
Glucose, Bld: 284 mg/dL — ABNORMAL HIGH (ref 65–99)
POTASSIUM: 4.2 mmol/L (ref 3.5–5.3)
SODIUM: 137 mmol/L (ref 135–146)
TOTAL PROTEIN: 7.2 g/dL (ref 6.1–8.1)
Total Bilirubin: 0.5 mg/dL (ref 0.2–1.2)

## 2017-10-17 LAB — RHEUMATOID FACTOR: Rheumatoid fact SerPl-aCnc: 68 IU/mL — ABNORMAL HIGH (ref <14)

## 2017-10-17 LAB — LIPID PANEL
CHOL/HDL RATIO: 5.1 (calc) — AB (ref <5.0)
CHOLESTEROL: 224 mg/dL — AB (ref <200)
HDL: 44 mg/dL — ABNORMAL LOW (ref >50)
LDL CHOLESTEROL (CALC): 153 mg/dL — AB
Non-HDL Cholesterol (Calc): 180 mg/dL (calc) — ABNORMAL HIGH (ref <130)
Triglycerides: 138 mg/dL (ref <150)

## 2017-10-17 LAB — HEMOGLOBIN A1C
Hgb A1c MFr Bld: 12.3 % of total Hgb — ABNORMAL HIGH (ref <5.7)
Mean Plasma Glucose: 306 (calc)
eAG (mmol/L): 17 (calc)

## 2017-10-17 LAB — C-REACTIVE PROTEIN: CRP: 27.4 mg/L — ABNORMAL HIGH (ref <8.0)

## 2017-10-17 LAB — ANA: Anti Nuclear Antibody(ANA): NEGATIVE

## 2017-10-17 LAB — SEDIMENTATION RATE: SED RATE: 51 mm/h — AB (ref 0–30)

## 2017-10-17 MED ORDER — LANCETS MISC: 11 refills | Status: DC

## 2017-10-17 MED ORDER — LANCET DEVICES MISC: 1 refills | Status: DC

## 2017-10-17 MED ORDER — BLOOD GLUCOSE TEST VI STRP: ORAL_STRIP | 11 refills | Status: DC

## 2017-10-17 MED ORDER — BLOOD GLUCOSE SYSTEM PAK KIT: PACK | 1 refills | Status: DC

## 2017-10-17 MED ORDER — METFORMIN HCL 500 MG PO TABS: ORAL_TABLET | ORAL | 3 refills | Status: DC

## 2017-10-17 MED FILL — Oxycodone w/ Acetaminophen Tab 5-325 MG: ORAL | Qty: 6 | Status: AC

## 2017-10-17 MED FILL — FREESTYLE LITE METER: 30 days supply | Qty: 1 | Fill #0

## 2017-10-17 MED FILL — FREESTYLE LANCETS: 30 days supply | Qty: 100 | Fill #0

## 2017-10-17 MED FILL — metFORMIN HCL 500 MG TABS: 500 | 45 days supply | Qty: 180 | Fill #0

## 2017-10-17 MED FILL — FREESTYLE LITE TEST STRIP: 30 days supply | Qty: 100 | Fill #0

## 2017-10-18 ENCOUNTER — Ambulatory Visit: Payer: 59

## 2017-10-18 ENCOUNTER — Ambulatory Visit
Admission: RE | Admit: 2017-10-18 | Discharge: 2017-10-18 | Disposition: A | Discharge status: Home / Self Care | Payer: 59 | Source: Ambulatory Visit | Attending: Obstetrics and Gynecology | Admitting: Obstetrics and Gynecology

## 2017-10-18 ENCOUNTER — Other Ambulatory Visit: Payer: Self-pay | Admitting: Obstetrics and Gynecology

## 2017-10-18 DIAGNOSIS — N6489 Other specified disorders of breast: Secondary | ICD-10-CM

## 2017-10-18 DIAGNOSIS — N6310 Unspecified lump in the right breast, unspecified quadrant: Secondary | ICD-10-CM | POA: Diagnosis not present

## 2017-10-18 DIAGNOSIS — R928 Other abnormal and inconclusive findings on diagnostic imaging of breast: Secondary | ICD-10-CM | POA: Diagnosis not present

## 2017-10-18 LAB — HM MAMMOGRAPHY

## 2017-10-21 ENCOUNTER — Encounter: Payer: Self-pay | Admitting: Family Medicine

## 2017-10-21 ENCOUNTER — Telehealth: Payer: Self-pay

## 2017-10-21 DIAGNOSIS — R7309 Other abnormal glucose: Secondary | ICD-10-CM

## 2017-10-21 NOTE — Telephone Encounter (Signed)
noted 

## 2017-10-21 NOTE — Telephone Encounter (Signed)
Patient was in office 10/18/2017 for instructions on how to use a glucose meter. Patient is requesting a referral for diabetic education. Referral placed

## 2017-10-24 ENCOUNTER — Encounter: Payer: 59 | Attending: Family Medicine | Admitting: Dietician

## 2017-10-24 ENCOUNTER — Encounter: Payer: Self-pay | Admitting: Dietician

## 2017-10-24 DIAGNOSIS — Z713 Dietary counseling and surveillance: Secondary | ICD-10-CM | POA: Diagnosis not present

## 2017-10-24 DIAGNOSIS — E118 Type 2 diabetes mellitus with unspecified complications: Secondary | ICD-10-CM | POA: Insufficient documentation

## 2017-10-24 DIAGNOSIS — E119 Type 2 diabetes mellitus without complications: Secondary | ICD-10-CM

## 2017-10-24 NOTE — Progress Notes (Signed)
Patient was seen on 10/24/17 for the first of a series of three diabetes self-management courses at the [facility].  Patient Education Plan per assessed needs and concerns is to attend four course education program for Diabetes Self Management Education.  The following learning objectives were met by the patient during this class:  Describe diabetes  State some common risk factors for diabetes  Defines the role of glucose and insulin  Identifies type of diabetes and pathophysiology  Describe the relationship between diabetes and cardiovascular risk  State the members of the Healthcare Team  States the rationale for glucose monitoring  State when to test glucose  State their individual Target Range  State the importance of logging glucose readings  Describe how to interpret glucose readings  Identifies A1C target  Explain the correlation between A1c and eAG values  State symptoms and treatment of high blood glucose  State symptoms and treatment of low blood glucose  Explain proper technique for glucose testing  Identifies proper sharps disposal  Handouts given during class include:  Living Well with Diabetes book  Carb Counting and Meal Planning book  Meal Plan Card  Carbohydrate guide  Meal planning worksheet  Low Sodium Flavoring Tips  The diabetes portion plate  X5O to eAG Conversion Chart  Diabetes Medications  Diabetes Recommended Care Schedule  Support Group  Diabetes Success Plan  Core Class Satisfaction Survey   Follow-Up Plan:  Attend core 2

## 2017-10-31 ENCOUNTER — Encounter: Payer: 59 | Admitting: Dietician

## 2017-10-31 DIAGNOSIS — E118 Type 2 diabetes mellitus with unspecified complications: Secondary | ICD-10-CM | POA: Diagnosis not present

## 2017-10-31 DIAGNOSIS — E119 Type 2 diabetes mellitus without complications: Secondary | ICD-10-CM

## 2017-10-31 DIAGNOSIS — Z713 Dietary counseling and surveillance: Secondary | ICD-10-CM | POA: Diagnosis not present

## 2017-10-31 NOTE — Progress Notes (Signed)
Patient was seen on 10/31/17 for the second of a series of three diabetes self-management courses at the [facility]. The following learning objectives were met by the patient during this class:   Describe the role of different macronutrients on glucose  Explain how carbohydrates affect blood glucose  State what foods contain the most carbohydrates  Demonstrate carbohydrate counting  Demonstrate how to read Nutrition Facts food label  Describe effects of various fats on heart health  Describe the importance of good nutrition for health and healthy eating strategies  Describe techniques for managing your shopping, cooking and meal planning  List strategies to follow meal plan when dining out  Describe the effects of alcohol on glucose and how to use it safely  Goals:  Follow Diabetes Meal Plan as instructed  Aim to spread carbs evenly throughout the day  Aim for 3 meals per day and snacks as needed Include lean protein foods to meals/snacks  Monitor glucose levels as instructed by your doctor   Follow-Up Plan:  Attend Core 3  Work towards following your personal food plan.

## 2017-11-01 ENCOUNTER — Other Ambulatory Visit: Payer: 59

## 2017-11-04 ENCOUNTER — Other Ambulatory Visit: Payer: Self-pay

## 2017-11-04 ENCOUNTER — Ambulatory Visit (INDEPENDENT_AMBULATORY_CARE_PROVIDER_SITE_OTHER): Payer: 59 | Admitting: Family Medicine

## 2017-11-04 ENCOUNTER — Encounter: Payer: Self-pay | Admitting: Family Medicine

## 2017-11-04 DIAGNOSIS — E119 Type 2 diabetes mellitus without complications: Secondary | ICD-10-CM

## 2017-11-04 MED ORDER — METFORMIN HCL 500 MG PO TABS: ORAL_TABLET | ORAL | 3 refills | Status: DC

## 2017-11-04 NOTE — Patient Instructions (Addendum)
Metformin 750mg  twice a day  F/U Change PHYSICAL to 1st week of March

## 2017-11-04 NOTE — Progress Notes (Signed)
   Subjective:    Patient ID: Kelly [name], female    DOB: 1960/12/26, 56 y.o.   MRN: [id number]  Patient presents for Follow-up (is not fasting)  DM -nrecently diagnosed A1c 12.3 percent about 2 weeks ago.  She was started on metformin she was to titrate up to 1000 mg twice a day but she states that she was afraid her blood sugar would drop.  Her blood sugars initially were 3-400 but she was also on steroids secondary to her polyarthralgia which she will be seeing rheumatology for.  Over the past few days her blood sugars have come down her fasting this morning was 160 last night blood sugar 193, currently in diabetes education weekly The past few days she is taking the metformin thousand milligrams in the morning and 500 mg in the evening   sinus pressure today, used flonase this morning she has not had any significant drainage no fever no cough or congestion.  Review Of Systems:  GEN- denies fatigue, fever, weight loss,weakness, recent illness HEENT- denies eye drainage, change in vision, nasal discharge, CVS- denies chest pain, palpitations RESP- denies SOB, cough, wheeze ABD- denies N/V, change in stools, abd pain GU- denies dysuria, hematuria, dribbling, incontinence MSK- denies joint pain, muscle aches, injury Neuro- denies headache, dizziness, syncope, seizure activity       Objective:    BP 130/74   Pulse 80   Temp 98 F (36.7 C) (Oral)   Resp 16   Ht 5\' 7"  (1.702 m)   Wt 249 lb (112.9 kg)   SpO2 97%   BMI 39.00 kg/m  GEN- NAD, alert and oriented x3 HEENT- PERRL, EOMI, non injected sclera, pink conjunctiva, MMM, oropharynx clear, Tm CLEAR BILAT no effusion, no maxillary or frontal sinus tenderness, mild rhinorrhea Neck- Supple, no LAD  CVS- RRR, no murmur RESP-CTAB         Assessment & Plan:      Problem List Items Addressed This Visit      Unprioritized   Diabetes mellitus, type II ([place])    New onset diabetes mellitus.  Her blood sugars are improving  with the metformin.  I may have her take 750 mg twice a day for now and see how her blood sugars trend.  She is working on dietary change and is currently in diabetic education classes.  No sign of any sinus infection advised she can use her Flonase for any nasal drainage.      Relevant Medications   metFORMIN (GLUCOPHAGE) 500 MG tablet      Note: This dictation was prepared with Dragon dictation along with smaller phrase technology. Any transcriptional errors that result from this process are unintentional.

## 2017-11-04 NOTE — Assessment & Plan Note (Signed)
New onset diabetes mellitus.  Her blood sugars are improving with the metformin.  I may have her take 750 mg twice a day for now and see how her blood sugars trend.  She is working on dietary change and is currently in diabetic education classes.  No sign of any sinus infection advised she can use her Flonase for any nasal drainage.

## 2017-11-06 MED ORDER — ESOMEPRAZOLE MAGNESIUM 40 MG PO CPDR: 40.0000 mg | DELAYED_RELEASE_CAPSULE | Freq: Every day | ORAL | 3 refills | Status: DC

## 2017-11-06 MED ORDER — IBUPROFEN 800 MG PO TABS: 800.0000 mg | ORAL_TABLET | Freq: Two times a day (BID) | ORAL | 3 refills | Status: DC | PRN

## 2017-11-06 MED ORDER — LOSARTAN POTASSIUM-HCTZ 50-12.5 MG PO TABS: 0.5000 | ORAL_TABLET | Freq: Every day | ORAL | 3 refills | Status: DC

## 2017-11-06 MED FILL — ESOMEPRAZOLE MAG DR 40 MG C: 40 | 90 days supply | Qty: 90 | Fill #0

## 2017-11-06 MED FILL — IBUPROFEN 800 MG TABS: 800 | 90 days supply | Qty: 180 | Fill #0

## 2017-11-06 MED FILL — LOSARTAN-HCTZ 50-12.5 MG TA: 50-12.5 | 90 days supply | Qty: 45 | Fill #0

## 2017-11-07 ENCOUNTER — Encounter: Payer: 59 | Attending: Family Medicine | Admitting: Dietician

## 2017-11-07 DIAGNOSIS — Z713 Dietary counseling and surveillance: Secondary | ICD-10-CM | POA: Insufficient documentation

## 2017-11-07 DIAGNOSIS — E118 Type 2 diabetes mellitus with unspecified complications: Secondary | ICD-10-CM | POA: Insufficient documentation

## 2017-11-07 MED FILL — ALPRAZolam 0.25 MG TABS: 0.25 | 13 days supply | Qty: 40 | Fill #0

## 2017-11-07 NOTE — Progress Notes (Signed)
Patient was seen on 11/07/17 for the third of a series of three diabetes self-management courses at the [facility].   Kelly [name] the amount of activity recommended for healthy living . Describe activities suitable for individual needs . Identify ways to regularly incorporate activity into daily life . Identify barriers to activity and ways to over come these barriers  Identify diabetes medications being personally used and their primary action for lowering glucose and possible side effects . Describe role of stress on blood glucose and develop strategies to address psychosocial issues . Identify diabetes complications and ways to prevent them  Explain how to manage diabetes during illness . Evaluate success in meeting personal goal . Establish 2-3 goals that they will plan to diligently work on until they return for the  1-month follow-up visit  Goals:   I will count my carb choices at most meals and snacks  I will be active 30 minutes or more 4+ times a week  I will take my diabetes medications as scheduled  I will test my glucose at least 3 times a day, 7 days a week  I will look at patterns in my record book at least once per day  To help manage stress I will take walks regularly  Your patient has identified these potential barriers to change:  Stress  Your patient has identified their diabetes self-care support plan as  Family Support   Plan:  Attend Support Group as desired

## 2017-11-11 ENCOUNTER — Encounter: Payer: Self-pay | Admitting: Family Medicine

## 2017-11-11 MED ORDER — DICLOFENAC SODIUM 75 MG PO TBEC: 75.0000 mg | DELAYED_RELEASE_TABLET | Freq: Two times a day (BID) | ORAL | 0 refills | Status: DC

## 2017-11-11 MED ORDER — METHOCARBAMOL 500 MG PO TABS: 500.0000 mg | ORAL_TABLET | Freq: Three times a day (TID) | ORAL | 0 refills | Status: DC | PRN

## 2017-11-11 MED FILL — METHOCARBAMOL 500 MG TABS: 500 | 15 days supply | Qty: 45 | Fill #0

## 2017-11-11 MED FILL — DICLOFENAC SODIUM 75 MG TAB: 75 | 30 days supply | Qty: 60 | Fill #0

## 2017-11-11 NOTE — Telephone Encounter (Signed)
Call placed to patient and patient made aware.   Prescription sent to pharmacy.   Routed to office manager.

## 2017-11-11 NOTE — Telephone Encounter (Signed)
Call placed to patient. LMTRC.  

## 2017-11-14 ENCOUNTER — Encounter: Payer: Self-pay | Admitting: Family Medicine

## 2017-11-14 ENCOUNTER — Telehealth: Payer: Self-pay | Admitting: *Deleted

## 2017-11-14 ENCOUNTER — Other Ambulatory Visit: Payer: Self-pay

## 2017-11-14 ENCOUNTER — Encounter: Payer: Self-pay | Admitting: Physician Assistant

## 2017-11-14 ENCOUNTER — Ambulatory Visit (INDEPENDENT_AMBULATORY_CARE_PROVIDER_SITE_OTHER): Payer: 59 | Admitting: Physician Assistant

## 2017-11-14 VITALS — BP 138/82 | HR 82 | Temp 97.4°F | Resp 16 | Ht 67.0 in | Wt 244.6 lb

## 2017-11-14 DIAGNOSIS — M79602 Pain in left arm: Secondary | ICD-10-CM | POA: Diagnosis not present

## 2017-11-14 MED ORDER — TRAMADOL HCL 50 MG PO TABS: ORAL_TABLET | ORAL | 0 refills | Status: DC

## 2017-11-14 MED FILL — traMADol HCL 50 MG TABS: 50 | 5 days supply | Qty: 30 | Fill #0

## 2017-11-14 NOTE — Progress Notes (Signed)
  Patient ID: Kelly [name] MRN: [id number], DOB: 02/04/1961, 56 y.o. Date of Encounter: 11/14/2017, 9:31 AM    Chief Complaint:  Chief Complaint  Patient presents with  . left arm pain     HPI: 56 y.o. year old female presents with above.   She has had office visits with Dr. Tracy recently regarding different areas of pain in different joints. Had labs with Dr. Crosbyton to evaluate rheumatologic disorder. Referral had been put in for follow-up with Dr. Deveshwar. She has had recent emails and phone calls regarding increasing pain. See her chart for all of those details.  Recently she had sent an email here on 11/11/17 regarding pain in the left shoulder and left arm.  At that time Robaxin was added and Motrin was changed to diclofenac 75 mg twice daily.   Also staff was to contact Dr. Deveshwar's office to see if we could get her in to see rheumatology sooner.  She then contacted our office again this morning reporting increased pain, "unable to move left arm" so she was added to my schedule. After that-- our staff did get in contact with Dr. Deveshwar's office and they were able to schedule her for tomorrow morning.  Today patient reports that she has been having increasing pain in her left shoulder and left arm.  Says that yesterday she had a lady at work massage the left shoulder area but it actually just made things a lot worse.  States that she had a lot of pain last night and did not get much sleep because of the pain.  This morning every time she tried to abduct at the left shoulder she had significant pain so could not even get her bra on.  Therefore is missing work today.  States that today is the first day of work that she has missed secondary to this pain. Current medications that she is taking for this issue: --Methocarbamol 500 mg 1 every 8 hour as needed --Diclofenac 75 mg 1 twice daily as needed      Home Meds:   Outpatient Medications Prior to Visit  Medication Sig  Dispense Refill  . albuterol (PROVENTIL HFA;VENTOLIN HFA) 108 (90 Base) MCG/ACT inhaler Inhale 2 puffs into the lungs every 6 (six) hours as needed for wheezing or shortness of breath. 1 Inhaler 0  . ALPRAZolam (XANAX) 0.25 MG tablet TAKE 1 TABLET TWICE A DAY AS NEEDED FOR ANXIETY 45 tablet 1  . Blood Glucose Monitoring Suppl (BLOOD GLUCOSE SYSTEM PAK) KIT Please dispense based on patient and insurance preference. Use to monitor FSBS 3x daily D/T fluctuating FSBS. E11.65. 1 each 1  . clobetasol (OLUX) 0.05 % topical foam Apply topically 2 (two) times daily. Apply sparingly once daily to scalp as needed 300 g 3  . diclofenac (VOLTAREN) 75 MG EC tablet Take 1 tablet (75 mg total) by mouth 2 (two) times daily. 60 tablet 0  . esomeprazole (NEXIUM) 40 MG capsule Take 1 capsule (40 mg total) by mouth daily. 90 capsule 3  . fluticasone (FLONASE) 50 MCG/ACT nasal spray USE 2 SPRAYS IN EACH       NOSTRIL DAILY 48 g 3  . Glucose Blood (BLOOD GLUCOSE TEST STRIPS) STRP Please dispense based on patient and insurance preference. Use to monitor FSBS 3x daily D/T fluctuating FSBS. E11.65. 100 each 11  . Lancet Devices MISC Please dispense based on patient and insurance preference. Use to monitor FSBS 3x daily D/T fluctuating FSBS. E11.65. 1 each 1  .   Lancets MISC Please dispense based on patient and insurance preference. Use to monitor FSBS 3x daily D/T fluctuating FSBS. E11.65. 100 each 11  . losartan-hydrochlorothiazide (HYZAAR) 50-12.5 MG tablet Take 0.5 tablets by mouth daily. 45 tablet 3  . metFORMIN (GLUCOPHAGE) 500 MG tablet Take 728m BID 180 tablet 3  . methocarbamol (ROBAXIN) 500 MG tablet Take 1 tablet (500 mg total) by mouth every 8 (eight) hours as needed for muscle spasms. 45 tablet 0  . naphazoline-pheniramine (ALLERGY EYE) 0.025-0.3 % ophthalmic solution Place 1 drop into both eyes 4 (four) times daily as needed for eye irritation.     No facility-administered medications prior to visit.      Allergies:  Allergies  Allergen Reactions  . Codeine Nausea Only  . Topamax [Topiramate] Nausea Only  . Maxalt [Rizatriptan] Nausea Only      Review of Systems: See HPI for pertinent ROS. All other ROS negative.    Physical Exam: Blood pressure 138/82, pulse 82, temperature (!) 97.4 F (36.3 C), temperature source Oral, resp. rate 16, height 5' 7" (1.702 m), weight 110.9 kg (244 lb 9.6 oz), SpO2 98 %., Body mass index is 38.31 kg/m. General: Obese white female with severely decreased muscle tone . appears in no acute distress. Neck: Supple. No thyromegaly. No lymphadenopathy. Lungs: Clear bilaterally to auscultation without wheezes, rales, or rhonchi. Breathing is unlabored. Heart: Regular rhythm. No murmurs, rubs, or gallops. Msk: Abduction of the left shoulder is limited secondary to pain. Extremities/Skin: Warm and dry.  Neuro: Alert and oriented X 3. Moves all extremities spontaneously. Gait is normal. CNII-XII grossly in tact. Psych:  Responds to questions appropriately with a normal affect.     ASSESSMENT AND PLAN:  57y.o. year old female with  1. Left arm pain Will add tramadol for her to use for some pain relief in the interim to hold her over until her appt with Dr. DEstanislado Pandytomorrow morning at 8 AM. Also gave note to be out of work today and tomorrow with plans to return to work Monday. - traMADol (ULTRAM) 50 MG tablet; Take 1-2 every 8 hours as needed for pain  Dispense: 30 tablet; Refill: 0   Signed, [id number]tah [facility]1/08/2018 9:31 AM

## 2017-11-14 NOTE — Telephone Encounter (Signed)
Received call from patient.   Reports that she has increased pain and unable to move arm today. Appointment scheduled with PA for acute pain.   Call placed to [facility] in regard to new patient appointment with Dr. Estanislado Pandy. Appointment re-scheduled for 11/15/2017 @ 8am. Patient to be made aware at appointment with PA.

## 2017-11-14 NOTE — Progress Notes (Signed)
Office Visit Note  Patient: Kelly [name]             Date of Birth: 06-09-1961           MRN: [id number]             PCP: Alycia Rossetti, MD Referring: Alycia Rossetti, MD Visit Date: 11/15/2017 Occupation: Admission services associate    Subjective:  Other (polyarthralgia, joint pain )   History of Present Illness: Kelly [name] is a 57 y.o. female seen in consultation per request of her PCP. According to patient her symptoms as started in August 2018 with left foot is stiffness. She states after doing an exercise bike she started having excruciating pain in her left foot and difficulty walking. She went to the med Center at [facility] where she was given a shoe and oxycodone. The symptoms resolved themselves after 2 days. In November 2018 she developed right hip pain and discomfort to the point that she was having difficulty walking. She was seen at the emergency room where she was given "pain shot and muscle relaxer" she states the pain resolved after couple of days although she has had intermittent pain in her right hip since then. A few days later she developed pain and discomfort in her left hand. Which also lasted for a few days. On 11/04/2017 she developed pain and discomfort in her left arm and left shoulder. She states is better but not completely resolved. And today she's been having discomfort in her right arm and shoulder. She denies any history of joint swelling. She has had a rash in her scalp for the last 7 years for which she's not seen her dermatologist. She states she was prescribed Olux foam by her PCP. There is no history of autoimmune disease in her family.  Activities of Daily Living:  Patient reports morning stiffness for 30 minutes.   Patient Reports nocturnal pain.  Difficulty dressing/grooming: Denies Difficulty climbing stairs: Denies Difficulty getting out of chair: Reports Difficulty using hands for taps, buttons, cutlery, and/or writing:  Denies   Review of Systems  Constitutional: Negative for fatigue, night sweats, weight gain, weight loss and weakness.  HENT: Positive for mouth dryness. Negative for mouth sores, trouble swallowing, trouble swallowing and nose dryness.   Eyes: Negative for pain, redness, visual disturbance and dryness.  Respiratory: Negative for cough, shortness of breath and difficulty breathing.   Cardiovascular: Negative for chest pain, palpitations, hypertension, irregular heartbeat and swelling in legs/feet.  Gastrointestinal: Negative for blood in stool, constipation and diarrhea.  Endocrine: Negative for increased urination.  Genitourinary: Negative for vaginal dryness.  Musculoskeletal: Positive for arthralgias, joint pain, myalgias, morning stiffness and myalgias. Negative for joint swelling, muscle weakness and muscle tenderness.  Skin: Positive for rash. Negative for color change, hair loss, skin tightness, ulcers and sensitivity to sunlight.       In her scalp  Allergic/Immunologic: Negative for susceptible to infections.  Neurological: Negative for dizziness, memory loss and night sweats.  Hematological: Negative for swollen glands.  Psychiatric/Behavioral: Positive for sleep disturbance. Negative for depressed mood. The patient is nervous/anxious.     PMFS History:  Patient Active Problem List   Diagnosis Date Noted  . Diabetes mellitus, type II ([place]) 11/04/2017  . Bronchial stenosis   . Renal cell carcinoma ([place])   . Essential hypertension 11/29/2015  . 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 Hemorrhoids, internal 03/04/2013  . External hemorrhoids with pain 03/04/2013  . Depression   .  Seasonal allergies   . Irritable bowel syndrome with constipation >> diarrhea   . Headache disorder   . GERD (gastroesophageal reflux disease) 01/28/2013  . Chest pain 08/08/2012  . Hyperlipidemia 08/08/2012  . Obesity (BMI 30-39.9) 08/08/2012    Past Medical History:  Diagnosis  Date  . Allergy   . Anxiety   . 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 Caren Macadam)  . Depression   . Diabetes mellitus without complication ([place])   . GERD (gastroesophageal reflux disease)   . Headache disorder   . Hyperlipidemia   . Hypertension   . IBS (irritable bowel syndrome)   . Obesity   . Salivary calculus   . Seasonal allergies     Family History  Problem Relation Age of Onset  . Heart attack Father        MI at age 71  . Coronary artery disease Brother   . Diabetes Brother   . Coronary artery disease Brother   . Lung cancer Mother 55       +TOBACCO, had quit many years before diagnosis  . COPD Brother        ?Melanoma  . Lung cancer Unknown   . Hypertension Unknown    Past Surgical History:  Procedure Laterality Date  . ABDOMINAL HYSTERECTOMY  2001   partial hysterectomy  . REDUCTION MAMMAPLASTY Bilateral 04/25/2015  . SALIVARY GLAND SURGERY    . TEMPOROMANDIBULAR JOINT SURGERY     3 times   Social History   Social History Narrative   Lives with her fiance. He has children.   Raised a niece after her brother died, she is now adult.     Objective: Vital Signs: BP (!) 144/93 (BP Location: Right Arm, Patient Position: Sitting, Cuff Size: Normal)   Pulse 96   Resp 17   Ht 5' 7"  (1.702 m)   Wt 248 lb (112.5 kg)   BMI 38.84 kg/m    Physical Exam  Constitutional: She is oriented to person, place, and time. She appears well-developed and well-nourished.  HENT:  Head: Normocephalic and atraumatic.  Eyes: Conjunctivae and EOM are normal.  Neck: Normal range of motion.  Cardiovascular: Normal rate, regular rhythm, normal heart sounds and intact distal pulses.  Pulmonary/Chest: Effort normal and breath sounds normal.  Abdominal: Soft. Bowel sounds are normal.  Lymphadenopathy:    She has no cervical adenopathy.  Neurological: She is alert and oriented to person, place, and time.  Skin: Skin is warm and dry. Capillary  refill takes less than 2 seconds.  Scaly rash was noted on the right parietal region of his scalp.  Psychiatric: She has a normal mood and affect. Her behavior is normal.  Nursing note and vitals reviewed.     Musculoskeletal Exam: C-spine and thoracic lumbar spine fairly good range of motion. She had no SI joint tenderness. She is some discomfort range of motion of her bilateral shoulder joints. Elbow joints wrist joints MCPs PIPs DIPs with good range of motion. She is some tenderness across her MCPs and PIPs with no synovitis. She is painful range of motion of her right hip joint. Knee joints ankles MTPs PIPs DIPs with good range of motion. She is some tenderness on palpation across her MTPs with no synovitis.  CDAI Exam: No CDAI exam completed.    Investigation: No additional findings.  Component     Latest Ref Rng & Units 10/16/2017  CRP     <8.0  mg/L 27.4 (H)  Anit Nuclear Antibody(ANA)     NEGATIVE NEGATIVE  RA Latex Turbid.     <14 IU/mL 68 (H)   CBC Latest Ref Rng & Units 10/16/2017 02/10/2015  WBC 3.8 - 10.8 Thousand/uL 7.7 6.4  Hemoglobin 11.7 - 15.5 g/dL 14.5 13.9  Hematocrit 35.0 - 45.0 % 43.0 41.6  Platelets 140 - 400 Thousand/uL 376 324   CMP Latest Ref Rng & Units 10/16/2017 02/10/2015  Glucose 65 - 99 mg/dL 284(H) 113(H)  BUN 7 - 25 mg/dL 11 12  Creatinine 0.50 - 1.05 mg/dL 0.65 0.70  Sodium 135 - 146 mmol/L 137 139  Potassium 3.5 - 5.3 mmol/L 4.2 4.0  Chloride 98 - 110 mmol/L 100 102  CO2 20 - 32 mmol/L 26 26  Calcium 8.6 - 10.4 mg/dL 9.5 8.9  Total Protein 6.1 - 8.1 g/dL 7.2 6.7  Total Bilirubin 0.2 - 1.2 mg/dL 0.5 0.5  Alkaline Phos 39 - 117 U/L - 91  AST 10 - 35 U/L 52(H) 23  ALT 6 - 29 U/L 64(H) 37(H)    Imaging: US Breast Ltd Uni Right Inc Axilla  Result Date: 10/18/2017 CLINICAL DATA:  Six-month follow-up for a probably benign right breast asymmetry. The patient has history of reduction mammoplasty in 2016. EXAM: 2D DIGITAL DIAGNOSTIC UNILATERAL  RIGHT MAMMOGRAM WITH CAD AND ADJUNCT TOMO RIGHT BREAST ULTRASOUND COMPARISON:  Previous exam(s). ACR Breast Density Category a: The breast tissue is almost entirely fatty. FINDINGS: There is a persistent asymmetry/ mass in the lateral aspect of the right breast in the posterior depth. This does appear more masslike on the current exam and measures approximately 3.2 cm. This may possibly represent a seroma from the patient's mammoplasty. Mammographic images were processed with CAD. There is a palpable lump in the lateral aspect of the right breast corresponding to the mass identified mammographically. Ultrasound targeted to this site demonstrates a circumscribed anechoic mass with internal mobile debris consistent with a seroma. This measures up to 3.6 x 1.8 x 2.5 cm. IMPRESSION: The mass in the lateral aspect of the right breast corresponds with a benign seroma. No further follow-up is required. RECOMMENDATION: Return to routine screening mammography is recommended. The patient will be due for screening in June of 2019. I have discussed the findings and recommendations with the patient. Results were also provided in writing at the conclusion of the visit. If applicable, a reminder letter will be sent to the patient regarding the next appointment. BI-RADS CATEGORY  2: Benign. Electronically Signed   By: Ammie Ferrier M.D.   On: 10/18/2017 08:27   Mm Diag Breast Tomo Uni Right  Result Date: 10/18/2017 CLINICAL DATA:  Six-month follow-up for a probably benign right breast asymmetry. The patient has history of reduction mammoplasty in 2016. EXAM: 2D DIGITAL DIAGNOSTIC UNILATERAL RIGHT MAMMOGRAM WITH CAD AND ADJUNCT TOMO RIGHT BREAST ULTRASOUND COMPARISON:  Previous exam(s). ACR Breast Density Category a: The breast tissue is almost entirely fatty. FINDINGS: There is a persistent asymmetry/ mass in the lateral aspect of the right breast in the posterior depth. This does appear more masslike on the current exam  and measures approximately 3.2 cm. This may possibly represent a seroma from the patient's mammoplasty. Mammographic images were processed with CAD. There is a palpable lump in the lateral aspect of the right breast corresponding to the mass identified mammographically. Ultrasound targeted to this site demonstrates a circumscribed anechoic mass with internal mobile debris consistent with a seroma. This measures up to 3.6  x 1.8 x 2.5 cm. IMPRESSION: The mass in the lateral aspect of the right breast corresponds with a benign seroma. No further follow-up is required. RECOMMENDATION: Return to routine screening mammography is recommended. The patient will be due for screening in June of 2019. I have discussed the findings and recommendations with the patient. Results were also provided in writing at the conclusion of the visit. If applicable, a reminder letter will be sent to the patient regarding the next appointment. BI-RADS CATEGORY  2: Benign. Electronically Signed   By: Ammie Ferrier M.D.   On: 10/18/2017 08:27   Xr Hip Unilat W Or W/o Pelvis 2-3 Views Right  Result Date: 11/15/2017 No SI joint changes were noted. Mild superior lateral narrowing of the right hip joint was noted. 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  Xr Foot 2 Views Left  Result Date: 11/15/2017 Mild first MTP and DIP narrowing was noted. No PIP or other MTP narrowing was noted. No erosive changes were noted. A dorsal spur and a small inferior calcaneal spur was noted. Impression: These findings are consistent with osteoarthritis of the foot.  Xr Foot 2 Views Right  Result Date: 11/15/2017 Mild first MTP and DIP narrowing was noted. No PIP or other MTP narrowing was noted. No erosive changes were noted. A dorsal spur and a small inferior calcaneal spur was noted. Impression: These findings are consistent with osteoarthritis of the foot.  Xr Hand 2 View Left  Result Date:  11/15/2017 Minimal PIP narrowing was noted. No MCP intercarpal radiocarpal changes were noted. No erosive changes were noted. 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  Xr Hand 2 View Right  Result Date: 11/15/2017 Minimal PIP narrowing was noted. No MCP intercarpal or radiocarpal changes were noted. No erosive changes were noted. 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   Speciality Comments: No specialty comments available.    Procedures:  No procedures performed Allergies: Codeine; Topamax [topiramate]; and Maxalt [rizatriptan]   Assessment / Plan:     Visit Diagnoses: Polyarthralgia - RF 68,high CRP. Patient has been having migratory polyarthralgias. It is involving different joints and time. The symptoms last for a few days. The pain in her shoulders and hip joint has been lingering. It raises the concern of rheumatoid arthritis.  Chronic pain of both shoulders: She's been having some discomfort range of motion of bilateral shoulders.  Pain in both hands: She complains of pain and discomfort in her bilateral hands no synovitis was noted. I will obtain x-ray of her bilateral hands today.  Pain in both feet: She gives history of intermittent pain in her feet to the point she has difficulty walking. She has no synovitis on examination today. She does have some tenderness on examination. I will obtain x-ray of her bilateral feet today.  Pain of right hip joint: She has right hip joint pain and discomfort with range of motion. I'll obtain x-ray of her right joint today.  Rash: She has a rash on her scalp. I will refer to dermatology for evaluation.  Elevated LFTs: Uncertain about the etiology of elevated LFTs. I've advised her to discontinue any anti-inflammatories. If her repeat LFTs are elevated we may have to consider Plaquenil and stop methotrexate  Essential hypertension: Her blood pressure is still  elevated despite medications.  History of diabetes mellitus, type II  Obesity (BMI 30-39.9): Weight loss diet and exercise was discussed.  History of gastroesophageal reflux (GERD)  History of IBS  History of hyperlipidemia  History of anxiety : She is on medication.   Orders: Orders Placed This Encounter  Procedures  . XR Hand 2 View Right  . XR Hand 2 View Left  . XR Foot 2 Views Right  . XR Foot 2 Views Left  . XR HIP UNILAT W OR W/O PELVIS 2-3 VIEWS RIGHT  . COMPLETE METABOLIC PANEL WITH GFR  . Urinalysis, Routine w reflex microscopic  . CK  . TSH  . Sedimentation rate  . Cyclic citrul peptide antibody, IgG  . 14-3-3 eta Protein  . HLA-B27 antigen  . Hepatitis B core antibody, IgM  . Hepatitis B surface antigen  . Hepatitis C antibody  . QuantiFERON-TB Gold Plus  . HIV antibody  . Serum protein electrophoresis with reflex  . IgG, IgA, IgM  . Glucose 6 phosphate dehydrogenase  . Ambulatory referral to Dermatology   No orders of the defined types were placed in this encounter.   Face-to-face time spent with patient was 50 minutes. Greater than 50% of time was spent in counseling and coordination of care.  Follow-Up Instructions: No Follow-up on file.   Bo Merino, MD  Note - This record has been created using Editor, commissioning.  Chart creation errors have been sought, but may not always  have been located. Such creation errors do not reflect on  the standard of medical care.

## 2017-11-15 ENCOUNTER — Encounter: Payer: Self-pay | Admitting: Rheumatology

## 2017-11-15 ENCOUNTER — Encounter: Payer: Self-pay | Admitting: *Deleted

## 2017-11-15 ENCOUNTER — Ambulatory Visit (INDEPENDENT_AMBULATORY_CARE_PROVIDER_SITE_OTHER): Payer: 59

## 2017-11-15 ENCOUNTER — Ambulatory Visit: Payer: 59 | Admitting: Rheumatology

## 2017-11-15 ENCOUNTER — Telehealth: Payer: Self-pay | Admitting: Rheumatology

## 2017-11-15 ENCOUNTER — Ambulatory Visit (INDEPENDENT_AMBULATORY_CARE_PROVIDER_SITE_OTHER): Payer: Self-pay

## 2017-11-15 VITALS — BP 144/93 | HR 96 | Resp 17 | Ht 67.0 in | Wt 248.0 lb

## 2017-11-15 DIAGNOSIS — M25511 Pain in right shoulder: Secondary | ICD-10-CM

## 2017-11-15 DIAGNOSIS — Z8639 Personal history of other endocrine, nutritional and metabolic disease: Secondary | ICD-10-CM

## 2017-11-15 DIAGNOSIS — M2550 Pain in unspecified joint: Secondary | ICD-10-CM | POA: Diagnosis not present

## 2017-11-15 DIAGNOSIS — M79642 Pain in left hand: Secondary | ICD-10-CM | POA: Diagnosis not present

## 2017-11-15 DIAGNOSIS — R21 Rash and other nonspecific skin eruption: Secondary | ICD-10-CM

## 2017-11-15 DIAGNOSIS — E669 Obesity, unspecified: Secondary | ICD-10-CM | POA: Diagnosis not present

## 2017-11-15 DIAGNOSIS — I10 Essential (primary) hypertension: Secondary | ICD-10-CM | POA: Diagnosis not present

## 2017-11-15 DIAGNOSIS — M79671 Pain in right foot: Secondary | ICD-10-CM

## 2017-11-15 DIAGNOSIS — M25561 Pain in right knee: Secondary | ICD-10-CM

## 2017-11-15 DIAGNOSIS — M79641 Pain in right hand: Secondary | ICD-10-CM

## 2017-11-15 DIAGNOSIS — R5383 Other fatigue: Secondary | ICD-10-CM | POA: Diagnosis not present

## 2017-11-15 DIAGNOSIS — M25551 Pain in right hip: Secondary | ICD-10-CM

## 2017-11-15 DIAGNOSIS — Z8719 Personal history of other diseases of the digestive system: Secondary | ICD-10-CM | POA: Diagnosis not present

## 2017-11-15 DIAGNOSIS — Z79899 Other long term (current) drug therapy: Secondary | ICD-10-CM

## 2017-11-15 DIAGNOSIS — M79672 Pain in left foot: Secondary | ICD-10-CM

## 2017-11-15 DIAGNOSIS — G8929 Other chronic pain: Secondary | ICD-10-CM

## 2017-11-15 DIAGNOSIS — Z8659 Personal history of other mental and behavioral disorders: Secondary | ICD-10-CM | POA: Diagnosis not present

## 2017-11-15 DIAGNOSIS — M25512 Pain in left shoulder: Secondary | ICD-10-CM

## 2017-11-15 MED FILL — FREESTYLE LITE TEST STRIP: 30 days supply | Qty: 100 | Fill #1

## 2017-11-15 MED FILL — FREESTYLE LANCETS: 30 days supply | Qty: 100 | Fill #1

## 2017-11-15 NOTE — Telephone Encounter (Signed)
Patient left a message requesting a call back. She had blood work drawn today, and would like to be checked for Lime Diease. Patient forgot to discuss this with Dr. Estanislado Pandy. Please call to advise.

## 2017-11-16 ENCOUNTER — Other Ambulatory Visit: Payer: Self-pay

## 2017-11-16 ENCOUNTER — Emergency Department (HOSPITAL_COMMUNITY): Payer: 59

## 2017-11-16 ENCOUNTER — Encounter (HOSPITAL_COMMUNITY): Payer: Self-pay | Admitting: Emergency Medicine

## 2017-11-16 ENCOUNTER — Emergency Department (HOSPITAL_COMMUNITY)
Admission: EM | Admit: 2017-11-16 | Discharge: 2017-11-16 | Disposition: A | Discharge status: Home / Self Care | Payer: 59 | Attending: Emergency Medicine | Admitting: Emergency Medicine

## 2017-11-16 DIAGNOSIS — Z7982 Long term (current) use of aspirin: Secondary | ICD-10-CM | POA: Insufficient documentation

## 2017-11-16 DIAGNOSIS — J302 Other seasonal allergic rhinitis: Secondary | ICD-10-CM | POA: Insufficient documentation

## 2017-11-16 DIAGNOSIS — R0789 Other chest pain: Secondary | ICD-10-CM | POA: Diagnosis not present

## 2017-11-16 DIAGNOSIS — R079 Chest pain, unspecified: Secondary | ICD-10-CM | POA: Diagnosis not present

## 2017-11-16 DIAGNOSIS — Z7984 Long term (current) use of oral hypoglycemic drugs: Secondary | ICD-10-CM | POA: Diagnosis not present

## 2017-11-16 DIAGNOSIS — E876 Hypokalemia: Secondary | ICD-10-CM | POA: Diagnosis not present

## 2017-11-16 DIAGNOSIS — Z79899 Other long term (current) drug therapy: Secondary | ICD-10-CM | POA: Diagnosis not present

## 2017-11-16 DIAGNOSIS — I10 Essential (primary) hypertension: Secondary | ICD-10-CM | POA: Insufficient documentation

## 2017-11-16 DIAGNOSIS — E1165 Type 2 diabetes mellitus with hyperglycemia: Secondary | ICD-10-CM | POA: Diagnosis not present

## 2017-11-16 DIAGNOSIS — E119 Type 2 diabetes mellitus without complications: Secondary | ICD-10-CM | POA: Diagnosis not present

## 2017-11-16 LAB — BASIC METABOLIC PANEL
Anion gap: 13 (ref 5–15)
BUN: 9 mg/dL (ref 6–20)
CALCIUM: 9.3 mg/dL (ref 8.9–10.3)
CO2: 22 mmol/L (ref 22–32)
CREATININE: 0.53 mg/dL (ref 0.44–1.00)
Chloride: 100 mmol/L — ABNORMAL LOW (ref 101–111)
GFR calc Af Amer: >60 mL/min (ref >60)
GFR calc non Af Amer: >60 mL/min (ref >60)
Glucose, Bld: 166 mg/dL — ABNORMAL HIGH (ref 65–99)
Potassium: 3.3 mmol/L — ABNORMAL LOW (ref 3.5–5.1)
SODIUM: 135 mmol/L (ref 135–145)

## 2017-11-16 LAB — I-STAT BETA HCG BLOOD, ED (MC, WL, AP ONLY)

## 2017-11-16 LAB — CBC
HCT: 40.4 % (ref 36.0–46.0)
Hemoglobin: 13.6 g/dL (ref 12.0–15.0)
MCH: 27.8 pg (ref 26.0–34.0)
MCHC: 33.7 g/dL (ref 30.0–36.0)
MCV: 82.4 fL (ref 78.0–100.0)
PLATELETS: 371 10*3/uL (ref 150–400)
RBC: 4.9 MIL/uL (ref 3.87–5.11)
RDW: 13.7 % (ref 11.5–15.5)
WBC: 9.2 10*3/uL (ref 4.0–10.5)

## 2017-11-16 LAB — I-STAT TROPONIN, ED: TROPONIN I, POC: 0 ng/mL (ref 0.00–0.08)

## 2017-11-16 MED ORDER — POTASSIUM CHLORIDE CRYS ER 20 MEQ PO TBCR
40.0000 meq | EXTENDED_RELEASE_TABLET | Freq: Once | ORAL | Status: AC
  Administered 2017-11-16: 40 meq via ORAL
  Filled 2017-11-16: qty 2

## 2017-11-16 NOTE — ED Triage Notes (Signed)
Pt. Stated, Kelly [name] had joint pain that started in august and Ive seen and went to several doctors and hospital . I saw a Rheumatologist yesterday and she said all my joints looked good. Im having really bad rt arm pain and I started having some off and on pains in my chest.

## 2017-11-16 NOTE — Discharge Instructions (Signed)
Contact your primary care physician in 2 days to  arrange to get your blood pressure rechecked within the next 3 weeks.  Today's was elevated at 148/86.  You may take Tylenol in addition to the medications currently prescribed.  Ask your primary care physician for referral to a pain clinic.  Your blood sugar today was mildly elevated at 166.  Blood potassium was 3.3, slightly low.  You have been given potassium supplementation while here.  Return if concern for any reason

## 2017-11-16 NOTE — ED Provider Notes (Addendum)
[facility] EMERGENCY DEPARTMENT Provider Note   CSN: [id number] Arrival date & time: 11/16/17  5784     History   Chief Complaint Chief Complaint  Patient presents with  . Joint Pain  . Chest Pain    HPI Kelly [name] is a 57 y.o. female.  She complained of left foot pain which started  August 2018 which had resolved in December 2018 developed right anterior thigh pain.  Thigh pain has improved with time.  Approximately a month ago she developed pain in her left bicep rating to left scapular area which is been constant.  Pain is worse with changing positions or moving her arm.  Nonexertional.  This morning she had left anterior chest pain lasting a split second which resolved spontaneously.  She denies any shortness of breath nausea or sweatiness.  She saw Dr.Devashwar, rheumatologist yesterday and had testing done in the office.  He is been treated with diclofenac, methocarbamol and tramadol, without relief.  Denies fever denies shortness of breath denies nausea.  No other associated symptoms.    HPI  Past Medical History:  Diagnosis Date  . Allergy   . Anxiety   . 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 Caren Macadam)  . Depression   . Diabetes mellitus without complication ([place])   . GERD (gastroesophageal reflux disease)   . Headache disorder   . Hyperlipidemia   . Hypertension   . IBS (irritable bowel syndrome)   . Obesity   . Salivary calculus   . Seasonal allergies     Patient Active Problem List   Diagnosis Date Noted  . Diabetes mellitus, type II ([place]) 11/04/2017  . Bronchial stenosis   . Renal cell carcinoma ([place])   . Essential hypertension 11/29/2015  . 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 Hemorrhoids, internal 03/04/2013  . External hemorrhoids with pain 03/04/2013  . Depression   . Seasonal allergies   . Irritable bowel syndrome with constipation >> diarrhea   . Headache disorder   . GERD  (gastroesophageal reflux disease) 01/28/2013  . Chest pain 08/08/2012  . Hyperlipidemia 08/08/2012  . Obesity (BMI 30-39.9) 08/08/2012    Past Surgical History:  Procedure Laterality Date  . ABDOMINAL HYSTERECTOMY  2001   partial hysterectomy  . REDUCTION MAMMAPLASTY Bilateral 04/25/2015  . SALIVARY GLAND SURGERY    . TEMPOROMANDIBULAR JOINT SURGERY     3 times    OB History    No data available       Home Medications    Prior to Admission medications   Medication Sig Start Date End Date Taking? Authorizing Provider  albuterol (PROVENTIL HFA;VENTOLIN HFA) 108 (90 Base) MCG/ACT inhaler Inhale 2 puffs into the lungs every 6 (six) hours as needed for wheezing or shortness of breath. Patient not taking: Reported on 11/15/2017 10/07/17   Alycia Rossetti, MD  ALPRAZolam Duanne Moron) 0.25 MG tablet TAKE 1 TABLET TWICE A DAY AS NEEDED FOR ANXIETY 04/09/16   Alycia Rossetti, MD  aspirin 81 MG tablet Take 81 mg by mouth daily.    [provider]  Blood Glucose Monitoring Suppl (BLOOD GLUCOSE SYSTEM PAK) KIT Please dispense based on patient and insurance preference. Use to monitor FSBS 3x daily D/T fluctuating FSBS. E11.65. 10/17/17   Alycia Rossetti, MD  clobetasol (OLUX) 0.05 % topical foam Apply topically 2 (two) times daily. Apply sparingly once daily to scalp as needed Patient taking differently: Apply topically  as needed. Apply sparingly once daily to scalp as needed 11/29/15   Alycia Rossetti, MD  diclofenac (VOLTAREN) 75 MG EC tablet Take 1 tablet (75 mg total) by mouth 2 (two) times daily. 11/11/17   Alycia Rossetti, MD  esomeprazole (NEXIUM) 40 MG capsule Take 1 capsule (40 mg total) by mouth daily. 11/06/17   Alycia Rossetti, MD  fluticasone [facility]) 50 MCG/ACT nasal spray USE 2 SPRAYS IN [facility]       NOSTRIL DAILY 09/19/15   Susy Frizzle, MD  Glucose Blood (BLOOD GLUCOSE TEST STRIPS) STRP Please dispense based on patient and insurance preference. Use to monitor FSBS 3x  daily D/T fluctuating FSBS. E11.65. 10/17/17   Alycia Rossetti, MD  ibuprofen (ADVIL,MOTRIN) 800 MG tablet Take 800 mg by mouth as needed.    [provider]  Lancet Devices MISC Please dispense based on patient and insurance preference. Use to monitor FSBS 3x daily D/T fluctuating FSBS. E11.65. 10/17/17   Alycia Rossetti, MD  Lancets MISC Please dispense based on patient and insurance preference. Use to monitor FSBS 3x daily D/T fluctuating FSBS. E11.65. 10/17/17   Alycia Rossetti, MD  losartan-hydrochlorothiazide [facility]) 50-12.5 MG tablet Take 0.5 tablets by mouth daily. 11/06/17   Alycia Rossetti, MD  metFORMIN (GLUCOPHAGE) 500 MG tablet Take 7104m BID 11/04/17   Medley, KModena Nunnery MD  methocarbamol (ROBAXIN) 500 MG tablet Take 1 tablet (500 mg total) by mouth every 8 (eight) hours as needed for muscle spasms. 11/11/17   Benton, KModena Nunnery MD  naphazoline-pheniramine (ALLERGY EYE) 0.025-0.3 % ophthalmic solution Place 1 drop into both eyes 4 (four) times daily as needed for eye irritation.    [provider]  traMADol (Veatrice Bourbon 50 MG tablet Take 1-2 every 8 hours as needed for pain 11/14/17   DOrlena Sheldon PA-C    Family History Family History  Problem Relation Age of Onset  . Heart attack Father        MI at age 55 . Coronary artery disease Brother   . Diabetes Brother   . Coronary artery disease Brother   . Lung cancer Mother 720      +TOBACCO, had quit many years before diagnosis  . COPD Brother        ?Melanoma  . Lung cancer Unknown   . Hypertension Unknown     Social History Social History   Tobacco Use  . Smoking status: Never Smoker  . Smokeless tobacco: Never Used  Substance Use Topics  . Alcohol use: No    Alcohol/week: 0.0 oz    Frequency: Never  . Drug use: No     Allergies   Codeine; Topamax [topiramate]; and Maxalt [rizatriptan]   Review of Systems Review of Systems  Constitutional: Negative.   HENT: Negative.   Respiratory:  Negative.   Cardiovascular: Positive for chest pain.  Gastrointestinal: Negative.   Musculoskeletal: Positive for arthralgias and myalgias.  Skin: Negative.   Allergic/Immunologic: Positive for immunocompromised state.       Diabetic  Neurological: Negative.   Psychiatric/Behavioral: Negative.   All other systems reviewed and are negative.    Physical Exam Updated Vital Signs BP (!) 135/100   Pulse (!) 102   Temp 98.7 F (37.1 C) (Oral)   Resp 20   Ht 5' 7"  (1.702 m)   Wt 112.5 kg (248 lb)   SpO2 97%   BMI 38.84 kg/m   Physical Exam  Constitutional: She appears well-developed  and well-nourished.  HENT:  Head: Normocephalic and atraumatic.  Eyes: Conjunctivae are normal. Pupils are equal, round, and reactive to light.  Neck: Neck supple. No tracheal deviation present. No thyromegaly present.  Cardiovascular: Normal rate and regular rhythm.  No murmur heard. Pulmonary/Chest: Effort normal and breath sounds normal.  Abdominal: Soft. Bowel sounds are normal. She exhibits no distension. There is no tenderness.  Obese  Musculoskeletal: Normal range of motion. She exhibits no edema or tenderness.  Entire spine is nontender.  All 4 extremities without swelling redness or tenderness, neurovascularly intact  Neurological: She is alert. Coordination normal.  Skin: Skin is warm and dry. No rash noted.  Psychiatric: She has a normal mood and affect.  Nursing note and vitals reviewed.    ED Treatments / Results  Labs (all labs ordered are listed, but only abnormal results are displayed) Labs Reviewed  BASIC METABOLIC PANEL - Abnormal; Notable for the following components:      Result Value   Potassium 3.3 (*)    Chloride 100 (*)    Glucose, Bld 166 (*)    All other components within normal limits  CBC  I-STAT TROPONIN, ED  I-STAT BETA HCG BLOOD, ED ([facility], [facility], [facility] ONLY)   Results for orders placed or performed during the hospital encounter of 16/10/96  Basic metabolic panel   Result Value Ref Range   Sodium 135 135 - 145 mmol/L   Potassium 3.3 (L) 3.5 - 5.1 mmol/L   Chloride 100 (L) 101 - 111 mmol/L   CO2 22 22 - 32 mmol/L   Glucose, Bld 166 (H) 65 - 99 mg/dL   BUN 9 6 - 20 mg/dL   Creatinine, Ser 0.53 0.44 - 1.00 mg/dL   Calcium 9.3 8.9 - 10.3 mg/dL   GFR calc non Af Amer >60 >60 mL/min   GFR calc Af Amer >60 >60 mL/min   Anion gap 13 5 - 15  CBC  Result Value Ref Range   WBC 9.2 4.0 - 10.5 K/uL   RBC 4.90 3.87 - 5.11 MIL/uL   Hemoglobin 13.6 12.0 - 15.0 g/dL   HCT 40.4 36.0 - 46.0 %   MCV 82.4 78.0 - 100.0 fL   MCH 27.8 26.0 - 34.0 pg   MCHC 33.7 30.0 - 36.0 g/dL   RDW 13.7 11.5 - 15.5 %   Platelets 371 150 - 400 K/uL  I-stat troponin, ED  Result Value Ref Range   Troponin i, poc 0.00 0.00 - 0.08 ng/mL   Comment 3          I-Stat beta hCG blood, ED  Result Value Ref Range   I-stat hCG, quantitative <5.0 <5 mIU/mL   Comment 3           Dg Chest 2 View  Result Date: 11/16/2017 CLINICAL DATA:  Chest pain, severe right arm pain, shortness of Breath EXAM: CHEST  2 VIEW COMPARISON:  None. FINDINGS: Mild peribronchial thickening. Heart and mediastinal contours are within normal limits. No focal opacities or effusions. No acute bony abnormality. IMPRESSION: Mild bronchitic changes. Electronically Signed   By: Rolm Baptise M.D.   On: 11/16/2017 08:02   US Breast Ltd Uni Right Inc Axilla  Result Date: 10/18/2017 CLINICAL DATA:  Six-month follow-up for a probably benign right breast asymmetry. The patient has history of reduction mammoplasty in 2016. EXAM: 2D DIGITAL DIAGNOSTIC UNILATERAL RIGHT MAMMOGRAM WITH CAD AND ADJUNCT TOMO RIGHT BREAST ULTRASOUND COMPARISON:  Previous exam(s). ACR Breast Density Category a: The  breast tissue is almost entirely fatty. FINDINGS: There is a persistent asymmetry/ mass in the lateral aspect of the right breast in the posterior depth. This does appear more masslike on the current exam and measures approximately 3.2 cm.  This may possibly represent a seroma from the patient's mammoplasty. Mammographic images were processed with CAD. There is a palpable lump in the lateral aspect of the right breast corresponding to the mass identified mammographically. Ultrasound targeted to this site demonstrates a circumscribed anechoic mass with internal mobile debris consistent with a seroma. This measures up to 3.6 x 1.8 x 2.5 cm. IMPRESSION: The mass in the lateral aspect of the right breast corresponds with a benign seroma. No further follow-up is required. RECOMMENDATION: Return to routine screening mammography is recommended. The patient will be due for screening in June of 2019. I have discussed the findings and recommendations with the patient. Results were also provided in writing at the conclusion of the visit. If applicable, a reminder letter will be sent to the patient regarding the next appointment. BI-RADS CATEGORY  2: Benign. Electronically Signed   By: Ammie Ferrier M.D.   On: 10/18/2017 08:27   Mm Diag Breast Tomo Uni Right  Result Date: 10/18/2017 CLINICAL DATA:  Six-month follow-up for a probably benign right breast asymmetry. The patient has history of reduction mammoplasty in 2016. EXAM: 2D DIGITAL DIAGNOSTIC UNILATERAL RIGHT MAMMOGRAM WITH CAD AND ADJUNCT TOMO RIGHT BREAST ULTRASOUND COMPARISON:  Previous exam(s). ACR Breast Density Category a: The breast tissue is almost entirely fatty. FINDINGS: There is a persistent asymmetry/ mass in the lateral aspect of the right breast in the posterior depth. This does appear more masslike on the current exam and measures approximately 3.2 cm. This may possibly represent a seroma from the patient's mammoplasty. Mammographic images were processed with CAD. There is a palpable lump in the lateral aspect of the right breast corresponding to the mass identified mammographically. Ultrasound targeted to this site demonstrates a circumscribed anechoic mass with internal mobile  debris consistent with a seroma. This measures up to 3.6 x 1.8 x 2.5 cm. IMPRESSION: The mass in the lateral aspect of the right breast corresponds with a benign seroma. No further follow-up is required. RECOMMENDATION: Return to routine screening mammography is recommended. The patient will be due for screening in June of 2019. I have discussed the findings and recommendations with the patient. Results were also provided in writing at the conclusion of the visit. If applicable, a reminder letter will be sent to the patient regarding the next appointment. BI-RADS CATEGORY  2: Benign. Electronically Signed   By: Ammie Ferrier M.D.   On: 10/18/2017 08:27   Xr Hip Unilat W Or W/o Pelvis 2-3 Views Right  Result Date: 11/15/2017 No SI joint changes were noted. Mild superior lateral narrowing of the right hip joint was noted. 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  Xr Foot 2 Views Left  Result Date: 11/15/2017 Mild first MTP and DIP narrowing was noted. No PIP or other MTP narrowing was noted. No erosive changes were noted. A dorsal spur and a small inferior calcaneal spur was noted. Impression: These findings are consistent with osteoarthritis of the foot.  Xr Foot 2 Views Right  Result Date: 11/15/2017 Mild first MTP and DIP narrowing was noted. No PIP or other MTP narrowing was noted. No erosive changes were noted. A dorsal spur and a small inferior calcaneal spur was noted. Impression: These findings are  consistent with osteoarthritis of the foot.  Xr Hand 2 View Left  Result Date: 11/15/2017 Minimal PIP narrowing was noted. No MCP intercarpal radiocarpal changes were noted. No erosive changes were noted. 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  Xr Hand 2 View Right  Result Date: 11/15/2017 Minimal PIP narrowing was noted. No MCP intercarpal or radiocarpal changes were noted. No erosive changes were  noted. 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 Chest x-ray viewed by me EKG  EKG Interpretation None     ED ECG REPORT   Date: 11/16/2017  Rate: 90  Rhythm: normal sinus rhythm  QRS Axis: normal  Intervals: normal  ST/T Wave abnormalities: nonspecific T wave changes  Conduction Disutrbances:none  Narrative Interpretation:   Old EKG Reviewed: unchanged Unchanged from 12/24/2014 I have personally reviewed the EKG tracing and disagree with the computerized printout as noted. Doubt inferior MI age-indeterminate Radiology Dg Chest 2 View  Result Date: 11/16/2017 CLINICAL DATA:  Chest pain, severe right arm pain, shortness of Breath EXAM: CHEST  2 VIEW COMPARISON:  None. FINDINGS: Mild peribronchial thickening. Heart and mediastinal contours are within normal limits. No focal opacities or effusions. No acute bony abnormality. IMPRESSION: Mild bronchitic changes. Electronically Signed   By: Rolm Baptise M.D.   On: 11/16/2017 08:02   Xr Hip Unilat W Or W/o Pelvis 2-3 Views Right  Result Date: 11/15/2017 No SI joint changes were noted. Mild superior lateral narrowing of the right hip joint was noted. 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  Xr Foot 2 Views Left  Result Date: 11/15/2017 Mild first MTP and DIP narrowing was noted. No PIP or other MTP narrowing was noted. No erosive changes were noted. A dorsal spur and a small inferior calcaneal spur was noted. Impression: These findings are consistent with osteoarthritis of the foot.  Xr Foot 2 Views Right  Result Date: 11/15/2017 Mild first MTP and DIP narrowing was noted. No PIP or other MTP narrowing was noted. No erosive changes were noted. A dorsal spur and a small inferior calcaneal spur was noted. Impression: These findings are consistent with osteoarthritis of the foot.  Xr Hand 2 View Left  Result Date: 11/15/2017 Minimal PIP narrowing was  noted. No MCP intercarpal radiocarpal changes were noted. No erosive changes were noted. 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  Xr Hand 2 View Right  Result Date: 11/15/2017 Minimal PIP narrowing was noted. No MCP intercarpal or radiocarpal changes were noted. No erosive changes were noted. 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   Procedures Procedures (including critical care time)  Medications Ordered in ED Medications - No data to display  Chest x-ray reviewed by me Initial Impression / Assessment and Plan / ED Course  I have reviewed the triage vital signs and the nursing notes.  Pertinent labs & imaging results that were available during my care of the patient were reviewed by me and considered in my medical decision making (see chart for details).     Received oral potassium supplementation while here. Highly doubt acute coronary syndrome.  Highly atypical symptoms.  Heart score equals 3.  Plan follow-up with PMD.  She can take Tylenol in addition to medications prescribed.  She reports that she is been advised by Dr.Devashrar to discontinue diclofenac.  I have asked patient to ask her primary care physician for referral to  a pain clinic.  She also has follow-up with Dr. Kathe Becton scheduled for later this month suggest blood pressure recheck 3 weeks Final Clinical Impressions(s) / ED Diagnoses  Diagnoses #1 atypical chest pain #2 arthralgias #3 hypokalemia #4 elevated blood pressure Final diagnoses:  None  #5 hyperglycemia  ED Discharge Orders    None       Orlie Dakin, MD 11/16/17 [place], [place], MD 11/16/17 [phone]

## 2017-11-18 ENCOUNTER — Encounter: Payer: Self-pay | Admitting: Family Medicine

## 2017-11-18 ENCOUNTER — Telehealth: Payer: Self-pay | Admitting: Family Medicine

## 2017-11-18 NOTE — Telephone Encounter (Signed)
I left message for the patient on her answering machine to call back. If she wants to have Lyme titers drawn we can do that for her.

## 2017-11-18 NOTE — Telephone Encounter (Signed)
Received fmla ppw via fax on 1-14  Will route to christina

## 2017-11-18 NOTE — Telephone Encounter (Signed)
Call placed to patient for more information. [place].

## 2017-11-18 NOTE — Telephone Encounter (Signed)
Awaiting forms

## 2017-11-18 NOTE — Progress Notes (Signed)
Her labs show positive anti-CCP antibody. Her labs are consistent with rheumatoid arthritis.

## 2017-11-19 ENCOUNTER — Encounter: Payer: 59 | Admitting: Family Medicine

## 2017-11-19 NOTE — Telephone Encounter (Signed)
Patient stopped by office.   States that she has only been out of work 11/14/2017- 11/15/2017 this month.

## 2017-11-19 NOTE — Telephone Encounter (Signed)
Received fax from [facility] for FMLA forms.   Call placed to patient for more information.   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 Awaiting response via MyChart.

## 2017-11-21 LAB — HEPATITIS C ANTIBODY
Hepatitis C Ab: NONREACTIVE
SIGNAL TO CUT-OFF: 0.02 (ref <1.00)

## 2017-11-21 LAB — HLA-B27 ANTIGEN: HLA-B27 ANTIGEN: NEGATIVE

## 2017-11-21 LAB — COMPLETE METABOLIC PANEL WITH GFR
AG Ratio: 1.5 (calc) (ref 1.0–2.5)
ALBUMIN MSPROF: 4.1 g/dL (ref 3.6–5.1)
ALKALINE PHOSPHATASE (APISO): 122 U/L (ref 33–130)
ALT: 23 U/L (ref 6–29)
AST: 16 U/L (ref 10–35)
BILIRUBIN TOTAL: 0.5 mg/dL (ref 0.2–1.2)
BUN: 10 mg/dL (ref 7–25)
CO2: 29 mmol/L (ref 20–32)
Calcium: 9.6 mg/dL (ref 8.6–10.4)
Chloride: 101 mmol/L (ref 98–110)
Creat: 0.64 mg/dL (ref 0.50–1.05)
GFR, Est African American: 117 mL/min/{1.73_m2} (ref >=60)
GFR, Est Non African American: 101 mL/min/{1.73_m2} (ref >=60)
GLUCOSE: 136 mg/dL — AB (ref 65–99)
Globulin: 2.7 g/dL (calc) (ref 1.9–3.7)
Potassium: 4.4 mmol/L (ref 3.5–5.3)
Sodium: 140 mmol/L (ref 135–146)
Total Protein: 6.8 g/dL (ref 6.1–8.1)

## 2017-11-21 LAB — QUANTIFERON-TB GOLD PLUS
MITOGEN-NIL: 8.52 [IU]/mL
NIL: 0.04 IU/mL
QuantiFERON-TB Gold Plus: NEGATIVE
TB1-NIL: 0 IU/mL
TB2-NIL: 0 [IU]/mL

## 2017-11-21 LAB — HEPATITIS B SURFACE ANTIGEN: Hepatitis B Surface Ag: NONREACTIVE

## 2017-11-21 LAB — PROTEIN ELECTROPHORESIS, SERUM, WITH REFLEX
ALPHA 2: 0.9 g/dL (ref 0.5–0.9)
Albumin ELP: 4 g/dL (ref 3.8–4.8)
Alpha 1: 0.4 g/dL — ABNORMAL HIGH (ref 0.2–0.3)
Beta 2: 0.5 g/dL (ref 0.2–0.5)
Beta Globulin: 0.6 g/dL (ref 0.4–0.6)
GAMMA GLOBULIN: 0.7 g/dL — AB (ref 0.8–1.7)
Total Protein: 7.1 g/dL (ref 6.1–8.1)

## 2017-11-21 LAB — URINALYSIS, ROUTINE W REFLEX MICROSCOPIC
BILIRUBIN URINE: NEGATIVE
Glucose, UA: NEGATIVE
Hgb urine dipstick: NEGATIVE
Ketones, ur: NEGATIVE
Leukocytes, UA: NEGATIVE
NITRITE: NEGATIVE
PROTEIN: NEGATIVE
Specific Gravity, Urine: 1.012 (ref 1.001–1.03)
pH: <=5 (ref 5.0–8.0)

## 2017-11-21 LAB — HIV ANTIBODY (ROUTINE TESTING W REFLEX): HIV: NONREACTIVE

## 2017-11-21 LAB — IFE INTERPRETATION: Immunofix Electr Int: NOT DETECTED

## 2017-11-21 LAB — SEDIMENTATION RATE: Sed Rate: 46 mm/h — ABNORMAL HIGH (ref 0–30)

## 2017-11-21 LAB — TSH: TSH: 2.78 mIU/L

## 2017-11-21 LAB — TEST AUTHORIZATION

## 2017-11-21 LAB — GLUCOSE 6 PHOSPHATE DEHYDROGENASE: G-6PDH: 18.1 U/g Hgb (ref 7.0–20.5)

## 2017-11-21 LAB — B. BURGDORFI ANTIBODIES

## 2017-11-21 LAB — CK: CK TOTAL: 20 U/L — AB (ref 29–143)

## 2017-11-21 LAB — 14-3-3 ETA PROTEIN

## 2017-11-21 LAB — CYCLIC CITRUL PEPTIDE ANTIBODY, IGG

## 2017-11-21 LAB — HEPATITIS B CORE ANTIBODY, IGM: Hep B C IgM: NONREACTIVE

## 2017-11-21 LAB — IGG, IGA, IGM
IMMUNOGLOBULIN A: 380 mg/dL (ref 81–463)
IgG (Immunoglobin G), Serum: 654 mg/dL — ABNORMAL LOW (ref 694–1618)
IgM, Serum: 62 mg/dL (ref 48–271)

## 2017-11-22 DIAGNOSIS — M0579 Rheumatoid arthritis with rheumatoid factor of multiple sites without organ or systems involvement: Secondary | ICD-10-CM | POA: Insufficient documentation

## 2017-11-22 MED FILL — metFORMIN HCL 500 MG TABS: 500 | 45 days supply | Qty: 180 | Fill #1

## 2017-11-22 NOTE — Telephone Encounter (Signed)
Patient returned call and made aware.   Paid form fee.   Faxed to [employer].

## 2017-11-22 NOTE — Progress Notes (Signed)
Office Visit Note  Patient: Kelly [name]             Date of Birth: 1961-07-03           MRN: [id number]             PCP: Alycia Rossetti, MD Referring: Alycia Rossetti, MD Visit Date: 11/28/2017 Occupation: @GUAROCC @    Subjective:  Right wrist pain    History of Present Illness: Kelly [name] is a 57 y.o. female with history of seropositive rheumatoid arthritis and osteoarthritis.  Patient states that since her initial visit she has been having increased pain and swelling in multiple joints.  She states the pain migrates to different joints every 3-4 days.  She states she was seen in the ED on 11/16/17.  She was having severe right bicep pain, which has since resolved.  Today she is having right wrist pain, warmth, and swelling.  She states that her pain and stiffness is worse in the evening.  She has not been taking Tramadol, Diclofenac tablets, or Robaxin because they provided no relief.   Patient reports that she did not mention at her previous visit but she was bit by a tick in spring 2018 and was treated with doxycyline.  She wonders if her joint pain could be due to Lyme's disease.     Activities of Daily Living:  Patient reports morning stiffness for 10  minutes.   Patient Reports nocturnal pain.  Difficulty dressing/grooming: Denies Difficulty climbing stairs: Denies Difficulty getting out of chair: Reports Difficulty using hands for taps, buttons, cutlery, and/or writing: Reports   Review of Systems  Constitutional: Positive for fatigue. Negative for weakness.  HENT: Negative for mouth sores, mouth dryness and nose dryness.   Eyes: Positive for dryness. Negative for redness and visual disturbance.  Respiratory: Negative for cough, hemoptysis, shortness of breath and difficulty breathing.   Cardiovascular: Negative for chest pain, palpitations, hypertension, irregular heartbeat and swelling in legs/feet.  Gastrointestinal: Negative for blood in stool, constipation  and diarrhea.  Endocrine: Negative for increased urination.  Genitourinary: Negative for painful urination.  Musculoskeletal: Positive for arthralgias, joint pain, joint swelling and morning stiffness. Negative for myalgias, muscle weakness, muscle tenderness and myalgias.  Skin: Negative for color change, pallor, rash, hair loss, nodules/bumps, redness, skin tightness, ulcers and sensitivity to sunlight.  Neurological: Negative for dizziness, numbness and headaches.  Hematological: Negative for swollen glands.  Psychiatric/Behavioral: Negative for depressed mood and sleep disturbance. The patient is not nervous/anxious.     PMFS History:  Patient Active Problem List   Diagnosis Date Noted  . Rheumatoid arthritis involving multiple sites with positive rheumatoid factor ([place]) 11/22/2017  . Diabetes mellitus, type II ([place]) 11/04/2017  . Bronchial stenosis   . Renal cell carcinoma ([place])   . Essential hypertension 11/29/2015  . 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 Hemorrhoids, internal 03/04/2013  . External hemorrhoids with pain 03/04/2013  . Depression   . Seasonal allergies   . Irritable bowel syndrome with constipation >> diarrhea   . Headache disorder   . GERD (gastroesophageal reflux disease) 01/28/2013  . Chest pain 08/08/2012  . Hyperlipidemia 08/08/2012  . Obesity (BMI 30-39.9) 08/08/2012    Past Medical History:  Diagnosis Date  . Allergy   . Anxiety   . 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 Caren Macadam)  . Depression   . Diabetes mellitus without complication ([place])   .  GERD (gastroesophageal reflux disease)   . Headache disorder   . Hyperlipidemia   . Hypertension   . IBS (irritable bowel syndrome)   . Obesity   . Salivary calculus   . Seasonal allergies     Family History  Problem Relation Age of Onset  . Heart attack Father        MI at age 17  . Coronary artery disease Brother   . Diabetes Brother   .  Coronary artery disease Brother   . Lung cancer Mother 54       +TOBACCO, had quit many years before diagnosis  . COPD Brother        ?Melanoma  . Lung cancer Unknown   . Hypertension Unknown    Past Surgical History:  Procedure Laterality Date  . ABDOMINAL HYSTERECTOMY  2001   partial hysterectomy  . REDUCTION MAMMAPLASTY Bilateral 04/25/2015  . SALIVARY GLAND SURGERY    . TEMPOROMANDIBULAR JOINT SURGERY     3 times   Social History   Social History Narrative   Lives with her fiance. He has children.   Raised a niece after her brother died, she is now adult.     Objective: Vital Signs: BP 132/88 (BP Location: Left Arm, Patient Position: Sitting, Cuff Size: Normal)   Pulse 75   Resp 17   Ht 5' 7"  (1.702 m)   Wt 249 lb 8 oz (113.2 kg)   BMI 39.08 kg/m    Physical Exam  Constitutional: She is oriented to person, place, and time. She appears well-developed and well-nourished.  HENT:  Head: Normocephalic and atraumatic.  Eyes: Conjunctivae and EOM are normal.  Neck: Normal range of motion.  Cardiovascular: Normal rate, regular rhythm, normal heart sounds and intact distal pulses.  Pulmonary/Chest: Effort normal and breath sounds normal.  Abdominal: Soft. Bowel sounds are normal.  Lymphadenopathy:    She has no cervical adenopathy.  Neurological: She is alert and oriented to person, place, and time.  Skin: Skin is warm and dry. Capillary refill takes less than 2 seconds.  Psychiatric: She has a normal mood and affect. Her behavior is normal.  Nursing note and vitals reviewed.    Musculoskeletal Exam: C-spine, thoracic, and lumbar spine good ROM.  Shoulder joints elbow joints good ROM.  Bilateral wrists slightly limited flexion.  Right wrist discomfort with erythema and warmth.  Left wrist no warmth or tenderness.  MCPs, PIPs, DIPs full ROM.  Complete fist formation.  No tenderness of MCPs.  No synovitis on exam.  Hip joints, knee joints, ankle joints, MTPs, PIPs, and  DIPs good ROM with no synovitis.  No trochanteric bursitis.  No midline spinal tenderness. No SI joint tenderness.    CDAI Exam: CDAI Homunculus Exam:   Tenderness:  RUE: wrist  Swelling:  RUE: wrist  Joint Counts:  CDAI Tender Joint count: 1 CDAI Swollen Joint count: 1  Global Assessments:  Patient Global Assessment: 6 Provider Global Assessment: 2  CDAI Calculated Score: 10    Investigation: No additional findings. CMP     Component Value Date/Time   NA 135 11/16/2017 0734   K 3.3 (L) 11/16/2017 0734   CL 100 (L) 11/16/2017 0734   CO2 22 11/16/2017 0734   GLUCOSE 166 (H) 11/16/2017 0734   BUN 9 11/16/2017 0734   CREATININE 0.53 11/16/2017 0734   CREATININE 0.64 11/15/2017 0853   CALCIUM 9.3 11/16/2017 0734   PROT 6.8 11/15/2017 0853   PROT 7.1 11/15/2017 0853   ALBUMIN  4.1 02/10/2015 0916   AST 16 11/15/2017 0853   ALT 23 11/15/2017 0853   ALKPHOS 91 02/10/2015 0916   BILITOT 0.5 11/15/2017 0853   GFRNONAA >60 11/16/2017 0734   GFRNONAA 101 11/15/2017 0853   GFRAA >60 11/16/2017 0734   GFRAA 117 11/15/2017 0853  UA negative, CK 20, TSH normal, IFE negative, HIV negative, G6PD normal TB gold negative immunoglobulins normal hepatitis B negative, hepatitis C negative ESR 46, anti-CCP greater than 250, 14 33 eta negative, HLA-B27 negative, Lyme titer negative  Imaging: Dg Chest 2 View  Result Date: 11/16/2017 CLINICAL DATA:  Chest pain, severe right arm pain, shortness of Breath EXAM: CHEST  2 VIEW COMPARISON:  None. FINDINGS: Mild peribronchial thickening. Heart and mediastinal contours are within normal limits. No focal opacities or effusions. No acute bony abnormality. IMPRESSION: Mild bronchitic changes. Electronically Signed   By: Rolm Baptise M.D.   On: 11/16/2017 08:02   Xr Hip Unilat W Or W/o Pelvis 2-3 Views Right  Result Date: 11/15/2017 No SI joint changes were noted. Mild superior lateral narrowing of the right hip joint was noted. Impression:  These findings are consistent with mild osteoarthritis of the right hip joint.  Xr Foot 2 Views Left  Result Date: 11/15/2017 Mild first MTP and DIP narrowing was noted. No PIP or other MTP narrowing was noted. No erosive changes were noted. A dorsal spur and a small inferior calcaneal spur was noted. Impression: These findings are consistent with osteoarthritis of the foot.  Xr Foot 2 Views Right  Result Date: 11/15/2017 Mild first MTP and DIP narrowing was noted. No PIP or other MTP narrowing was noted. No erosive changes were noted. A dorsal spur and a small inferior calcaneal spur was noted. Impression: These findings are consistent with osteoarthritis of the foot.  Xr Hand 2 View Left  Result Date: 11/15/2017 Minimal PIP narrowing was noted. No MCP intercarpal radiocarpal changes were noted. No erosive changes were noted. 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  Xr Hand 2 View Right  Result Date: 11/15/2017 Minimal PIP narrowing was noted. No MCP intercarpal or radiocarpal changes were noted. No erosive changes were noted. 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   Speciality Comments: No specialty comments available.    Procedures:  No procedures performed Allergies: Codeine; Topamax [topiramate]; and Maxalt [rizatriptan]       Assessment / Plan:     Visit Diagnoses: Rheumatoid arthritis involving multiple sites with positive rheumatoid factor (HCC) - Positive RF, positive anti-CCP, elevated ESR, arthritis. She continues to have migratory polyarthralgias.  She has warmth and tenderness of her right wrist today. Different treatment options and their side effects were discussed at length. We decided to proceed with Plaquenil. I will call in prescription for Plaquenil 200 mg 1 tablet by mouth twice a day. We will check labs in a month, 3 months then every 5 months. She will need a baseline eye exam and  then I exam on a yearly basis. A handout was given consent was taken. She already had flu vaccine at work. We discussed use of pneumococcal vaccine and the shingrix vaccine.  Patient was counseled on the purpose, proper use, and adverse effects of hydroxychloroquine including nausea/diarrhea, skin rash, headaches, and sun sensitivity.  Discussed importance of annual eye exams while on hydroxychloroquine to monitor to ocular toxicity and discussed importance of frequent laboratory monitoring.  Provided patient with eye exam form for baseline ophthalmologic  exam.  Provided patient with educational materials on hydroxychloroquine and answered all questions.  Patient consented to hydroxychloroquine.  Will upload consent in the media tab.    Primary osteoarthritis of both hands - mild .She has no tenderness of PIP or DIP joints today.  She has no synovitis.    Unilateral primary osteoarthritis, right hip - mild. Her right hip has good ROM with no discomfort.  A few days ago her left hip was causing discomfort but it has improved.  Primary osteoarthritis of both feet - mild.She has no pain in her feet.  No tenderness on exam.  She is wearing proper fitting shoes.  Other medical problems are listed as follows:      Essential hypertension  Type 2 diabetes mellitus without complication, without long-term current use of insulin (HCC)  Mixed hyperlipidemia  Renal cell carcinoma, unspecified laterality (HCC)  Obesity (BMI 30-39.9)  Irritable bowel syndrome with constipation >> diarrhea  Gastroesophageal reflux disease without esophagitis    Orders: Orders Placed This Encounter  Procedures  . COMPLETE METABOLIC PANEL WITH GFR  . CBC with Differential/Platelet   Meds ordered this encounter  Medications  . hydroxychloroquine (PLAQUENIL) 200 MG tablet    Sig: Take 1 tablet by mouth bid    Dispense:  60 tablet    Refill:  2    Face-to-face time spent with patient was 30 minutes. Greater than  50% of time was spent in counseling and coordination of care.  Follow-Up Instructions: Return in about 3 months (around 02/26/2018) for Rheumatoid arthritis, Osteoarthritis.   Bo Merino, MD   I examined and evaluated the patient with Hazel Sams PA-C. The plan of care was discussed as noted above.  Bo Merino, MD  Note - This record has been created using Editor, commissioning.  Chart creation errors have been sought, but may not always  have been located. Such creation errors do not reflect on  the standard of medical care.

## 2017-11-22 NOTE — Telephone Encounter (Signed)
Received completed FMLA forms from provider.   Form Fee to be charged per provider. Patient contacted to pay fee.   [place].

## 2017-11-25 ENCOUNTER — Telehealth: Payer: Self-pay | Admitting: Rheumatology

## 2017-11-25 NOTE — Telephone Encounter (Signed)
Patient having a lot of pain, and wanted to know if there is anything doctor can give her unti lher Thursday appt. Please call to discuss.

## 2017-11-26 ENCOUNTER — Telehealth: Payer: Self-pay | Admitting: Rheumatology

## 2017-11-26 ENCOUNTER — Ambulatory Visit: Payer: 59 | Admitting: Rheumatology

## 2017-11-26 NOTE — Telephone Encounter (Signed)
See previous phone note.  

## 2017-11-26 NOTE — Telephone Encounter (Signed)
Patient called back again this morning complaining of "extreme" pain in her hands and needing something for the pain stronger than Ibuprofen which she has taken all night with no relief.  Patient's CB# [phone]

## 2017-11-26 NOTE — Telephone Encounter (Signed)
I called patient and explained to her that it will be better if I evaluate her before giving her medications. She was in agreement and she will come back for follow-up visit on January 24 to discuss treatment plan.

## 2017-11-26 NOTE — Telephone Encounter (Signed)
Patient states she is taking Ibuprofen 800 mg every 6-8 hour and having no relief. Patient states she is having pain in her hands and her legs. Patient states she is having swelling in her left hand and wrist. Patient is wanting to know if there is something she can have for the pain or inflammation. Patient has an appointment to follow up with you on 11/28/17. Please advise.

## 2017-11-27 ENCOUNTER — Ambulatory Visit: Payer: 59 | Admitting: Rheumatology

## 2017-11-28 ENCOUNTER — Encounter: Payer: Self-pay | Admitting: Rheumatology

## 2017-11-28 ENCOUNTER — Ambulatory Visit: Payer: 59 | Admitting: Rheumatology

## 2017-11-28 VITALS — BP 132/88 | HR 75 | Resp 17 | Ht 67.0 in | Wt 249.5 lb

## 2017-11-28 DIAGNOSIS — M19042 Primary osteoarthritis, left hand: Secondary | ICD-10-CM

## 2017-11-28 DIAGNOSIS — M19071 Primary osteoarthritis, right ankle and foot: Secondary | ICD-10-CM

## 2017-11-28 DIAGNOSIS — M19072 Primary osteoarthritis, left ankle and foot: Secondary | ICD-10-CM

## 2017-11-28 DIAGNOSIS — E119 Type 2 diabetes mellitus without complications: Secondary | ICD-10-CM

## 2017-11-28 DIAGNOSIS — E782 Mixed hyperlipidemia: Secondary | ICD-10-CM | POA: Diagnosis not present

## 2017-11-28 DIAGNOSIS — I10 Essential (primary) hypertension: Secondary | ICD-10-CM | POA: Diagnosis not present

## 2017-11-28 DIAGNOSIS — E669 Obesity, unspecified: Secondary | ICD-10-CM | POA: Diagnosis not present

## 2017-11-28 DIAGNOSIS — M0579 Rheumatoid arthritis with rheumatoid factor of multiple sites without organ or systems involvement: Secondary | ICD-10-CM

## 2017-11-28 DIAGNOSIS — K581 Irritable bowel syndrome with constipation: Secondary | ICD-10-CM | POA: Diagnosis not present

## 2017-11-28 DIAGNOSIS — Z79899 Other long term (current) drug therapy: Secondary | ICD-10-CM

## 2017-11-28 DIAGNOSIS — M1611 Unilateral primary osteoarthritis, right hip: Secondary | ICD-10-CM | POA: Diagnosis not present

## 2017-11-28 DIAGNOSIS — M19041 Primary osteoarthritis, right hand: Secondary | ICD-10-CM

## 2017-11-28 DIAGNOSIS — K219 Gastro-esophageal reflux disease without esophagitis: Secondary | ICD-10-CM

## 2017-11-28 DIAGNOSIS — C649 Malignant neoplasm of unspecified kidney, except renal pelvis: Secondary | ICD-10-CM

## 2017-11-28 MED ORDER — HYDROXYCHLOROQUINE SULFATE 200 MG PO TABS: ORAL_TABLET | ORAL | 2 refills | Status: DC

## 2017-11-28 MED FILL — HYDROXYCHLOROQUINE 200 MG: 200 | 30 days supply | Qty: 60 | Fill #0

## 2017-11-28 NOTE — Patient Instructions (Signed)
Hydroxychloroquine tablets What is this medicine? HYDROXYCHLOROQUINE (hye drox ee KLOR oh k[place]) is used to treat rheumatoid arthritis and systemic lupus erythematosus. It is also used to treat malaria. This medicine may be used for other purposes; ask your health care provider or pharmacist if you have questions. COMMON BRAND NAME(S): Plaquenil, Quineprox What should I tell my health care provider before I take this medicine? They need to know if you have any of these conditions: -diabetes -eye disease, vision problems -G6PD deficiency -history of blood diseases -history of irregular heartbeat -if you often drink alcohol -kidney disease -liver disease -porphyria -psoriasis -seizures -an unusual or allergic reaction to chloroquine, hydroxychloroquine, other medicines, foods, dyes, or preservatives -pregnant or trying to get pregnant -breast-feeding How should I use this medicine? Take this medicine by mouth with a glass of water. Follow the directions on the prescription label. Avoid taking antacids within 4 hours of taking this medicine. It is best to separate these medicines by at least 4 hours. Do not cut, crush or chew this medicine. You can take it with or without food. If it upsets your stomach, take it with food. Take your medicine at regular intervals. Do not take your medicine more often than directed. Take all of your medicine as directed even if you think you are better. Do not skip doses or stop your medicine early. Talk to your pediatrician regarding the use of this medicine in children. While this drug may be prescribed for selected conditions, precautions do apply. Overdosage: If you think you have taken too much of this medicine contact a poison control center or emergency room at once. NOTE: This medicine is only for you. Do not share this medicine with others. What if I miss a dose? If you miss a dose, take it as soon as you can. If it is almost time for your next dose,  take only that dose. Do not take double or extra doses. What may interact with this medicine? Do not take this medicine with any of the following medications: -cisapride -dofetilide -dronedarone -live virus vaccines -penicillamine -pimozide -thioridazine -ziprasidone This medicine may also interact with the following medications: -ampicillin -antacids -cimetidine -cyclosporine -digoxin -medicines for diabetes, like insulin, glipizide, glyburide -medicines for seizures like carbamazepine, phenobarbital, phenytoin -mefloquine -methotrexate -other medicines that prolong the QT interval (cause an abnormal heart rhythm) -praziquantel This list may not describe all possible interactions. Give your health care provider a list of all the medicines, herbs, non-prescription drugs, or dietary supplements you use. Also tell them if you smoke, drink alcohol, or use illegal drugs. Some items may interact with your medicine. What should I watch for while using this medicine? Tell your doctor or healthcare professional if your symptoms do not start to get better or if they get worse. Avoid taking antacids within 4 hours of taking this medicine. It is best to separate these medicines by at least 4 hours. Tell your doctor or health care professional right away if you have any change in your eyesight. Your vision and blood may be tested before and during use of this medicine. This medicine can make you more sensitive to the sun. Keep out of the sun. If you cannot avoid being in the sun, wear protective clothing and use sunscreen. Do not use sun lamps or tanning beds/booths. What side effects may I notice from receiving this medicine? Side effects that you should report to your doctor or health care professional as soon as possible: -allergic reactions like skin rash,   itching or hives, swelling of the face, lips, or tongue -changes in vision -decreased hearing or ringing of the ears -redness,  blistering, peeling or loosening of the skin, including inside the mouth -seizures -sensitivity to light -signs and symptoms of a dangerous change in heartbeat or heart rhythm like chest pain; dizziness; fast or irregular heartbeat; palpitations; feeling faint or lightheaded, falls; breathing problems -signs and symptoms of liver injury like dark yellow or brown urine; general ill feeling or flu-like symptoms; light-colored stools; loss of appetite; nausea; right upper belly pain; unusually weak or tired; yellowing of the eyes or skin -signs and symptoms of low blood sugar such as feeling anxious; confusion; dizziness; increased hunger; unusually weak or tired; sweating; shakiness; cold; irritable; headache; blurred vision; fast heartbeat; loss of consciousness -uncontrollable head, mouth, neck, arm, or leg movements Side effects that usually do not require medical attention (report to your doctor or health care professional if they continue or are bothersome): -anxious -diarrhea -dizziness -hair loss -headache -irritable -loss of appetite -nausea, vomiting -stomach pain This list may not describe all possible side effects. Call your doctor for medical advice about side effects. You may report side effects to FDA at 1-800-FDA-1088. Where should I keep my medicine? Keep out of the reach of children. In children, this medicine can cause overdose with small doses. Store at room temperature between 15 and 30 degrees C (59 and 86 degrees F). Protect from moisture and light. Throw away any unused medicine after the expiration date. NOTE: This sheet is a summary. It may not cover all possible information. If you have questions about this medicine, talk to your doctor, pharmacist, or health care provider.  2018 Elsevier/Gold Standard (2016-06-06 14:16:15) Standing Labs We placed an order today for your standing lab work.    Please come back and get your standing labs in 1 month after starting  Plaquenil and then every 3 months  We have open lab Monday through Friday from 8:30-11:30 AM and 1:30-4 PM at the office of Dr. Bo Merino.   The office is located at [address] [place], [place], [place] [address] No appointment is necessary.   Labs are drawn by [facility].  You may receive a bill from [facility] for your lab work. If you have any questions regarding directions or hours of operation,  please call [phone].

## 2017-12-03 ENCOUNTER — Encounter: Payer: Self-pay | Admitting: Family Medicine

## 2017-12-03 MED ORDER — LANCETS MISC: 11 refills | Status: DC

## 2017-12-03 MED ORDER — BLOOD GLUCOSE TEST VI STRP: ORAL_STRIP | 11 refills | Status: DC

## 2017-12-03 MED ORDER — BLOOD GLUCOSE SYSTEM PAK KIT: PACK | 1 refills | Status: DC

## 2017-12-03 MED ORDER — LANCET DEVICES MISC: 1 refills | Status: DC

## 2017-12-05 ENCOUNTER — Encounter (INDEPENDENT_AMBULATORY_CARE_PROVIDER_SITE_OTHER): Payer: 59

## 2017-12-06 MED FILL — ALPRAZolam 0.25 MG TABS: 0.25 | 14 days supply | Qty: 40 | Fill #0

## 2017-12-09 MED FILL — ACCU-CHEK GUIDE TEST STRIP: 30 days supply | Qty: 100 | Fill #0

## 2017-12-09 MED FILL — ACCU-CHEK FASTCLIX LANCETS: 30 days supply | Qty: 102 | Fill #0

## 2017-12-10 ENCOUNTER — Ambulatory Visit: Payer: 59 | Admitting: Gastroenterology

## 2017-12-18 ENCOUNTER — Telehealth: Payer: Self-pay | Admitting: Rheumatology

## 2017-12-18 NOTE — Telephone Encounter (Signed)
Patient called stating that she would like her order for labwork be sent to [facility].  Patient states that she was given a 30 day supply of Plaquenil and wants to know when she should get her labwork done.

## 2017-12-19 NOTE — Telephone Encounter (Signed)
Left message to advise patient lab orders are in the computer and she may have them drawn at [facility]. Patient advised that she should have labs drawn on 12/29/17 and then 3 months after that.

## 2017-12-20 ENCOUNTER — Encounter: Payer: Self-pay | Admitting: Family Medicine

## 2017-12-20 ENCOUNTER — Ambulatory Visit (INDEPENDENT_AMBULATORY_CARE_PROVIDER_SITE_OTHER): Payer: 59 | Admitting: Family Medicine

## 2017-12-20 VITALS — BP 144/82 | HR 100 | Temp 97.9°F | Resp 16 | Ht 67.0 in | Wt 242.2 lb

## 2017-12-20 DIAGNOSIS — A5901 Trichomonal vulvovaginitis: Secondary | ICD-10-CM

## 2017-12-20 DIAGNOSIS — N898 Other specified noninflammatory disorders of vagina: Secondary | ICD-10-CM | POA: Diagnosis not present

## 2017-12-20 LAB — WET PREP FOR TRICH, YEAST, CLUE

## 2017-12-20 MED ORDER — FLUCONAZOLE 150 MG PO TABS: 150.0000 mg | ORAL_TABLET | Freq: Once | ORAL | 1 refills | Status: AC

## 2017-12-20 MED ORDER — METRONIDAZOLE 500 MG PO TABS: 500.0000 mg | ORAL_TABLET | Freq: Two times a day (BID) | ORAL | 0 refills | Status: DC

## 2017-12-20 MED FILL — metroNIDAZOLE 500 MG TABS: 500 | 7 days supply | Qty: 14 | Fill #0

## 2017-12-20 MED FILL — FLUCONAZOLE 150 MG TABLET: 150 | 1 days supply | Qty: 1 | Fill #0

## 2017-12-20 NOTE — Progress Notes (Signed)
   Subjective:    Patient ID: Kelly [name], female    DOB: Apr 12, 1961, 57 y.o.   MRN: [id number]  Patient presents for Vaginal Discharge   Vaginal discharge for past 2 days, large amounts, brownish / yellow color  No dysuria   no abd pain, no fever or vomiting  Started after intercourse with husband this past weekend but states he has given her trichomonas before Husband actually lives out of town, has cheated before   Currently on treatment for RA from rheumatology on Plaquenil   Review Of Systems:  GEN- denies fatigue, fever, weight loss,weakness, recent illness HEENT- denies eye drainage, change in vision, nasal discharge, CVS- denies chest pain, palpitations RESP- denies SOB, cough, wheeze ABD- denies N/V, change in stools, abd pain GU- denies dysuria, hematuria, dribbling, incontinence MSK- + joint pain, muscle aches, injury Neuro- denies headache, dizziness, syncope, seizure activity       Objective:    BP (!) 144/82   Pulse 100   Temp 97.9 F (36.6 C)   Resp 16   Ht 5\' 7"  (1.702 m)   Wt 242 lb 3.2 oz (109.9 kg)   SpO2 100%   BMI 37.93 kg/m  GEN- NAD, alert and oriented x3 HEENT- PERRL, EOMI, non injected sclera, pink conjunctiva, MMM, oropharynx clear Neck- Supple, no thyromegaly CVS- RRR, no murmur RESP-CTAB ABD-NABS,soft,NT,ND GU- normal external genitalia, vaginal mucosa pink and moist, uterus absent, copious amounts of yellow discharge, mild odor, no blood seen       Assessment & Plan:      Problem List Items Addressed This Visit    None    Visit Diagnoses    Trichomonal vaginitis    -  Primary   Treat with flagyl, given diflucan due to yeast infection after antibiotisc, GC pending, HIV test done 3 weeks ago neg, reccomend repeat in 3 months. Husband was suppose to go get tested today   Relevant Medications   metroNIDAZOLE (FLAGYL) 500 MG tablet   fluconazole (DIFLUCAN) 150 MG tablet   Other Relevant Orders   WET PREP FOR [place], YEAST, CLUE  (Completed)   C. trachomatis/N. gonorrhoeae RNA      Note: This dictation was prepared with Dragon dictation along with smaller Company secretary. Any transcriptional errors that result from this process are unintentional.

## 2017-12-20 NOTE — Patient Instructions (Addendum)
Take antibiotics as prescribed  We will call with other results F/U as previous

## 2017-12-23 LAB — C. TRACHOMATIS/N. GONORRHOEAE RNA
C. trachomatis RNA, TMA: NOT DETECTED
N. GONORRHOEAE RNA, TMA: NOT DETECTED

## 2017-12-24 ENCOUNTER — Other Ambulatory Visit: Payer: Self-pay

## 2017-12-24 DIAGNOSIS — Z79899 Other long term (current) drug therapy: Secondary | ICD-10-CM

## 2017-12-24 LAB — COMPLETE METABOLIC PANEL WITH GFR
AG Ratio: 1.5 (calc) (ref 1.0–2.5)
ALBUMIN MSPROF: 4.1 g/dL (ref 3.6–5.1)
ALT: 27 U/L (ref 6–29)
AST: 23 U/L (ref 10–35)
Alkaline phosphatase (APISO): 91 U/L (ref 33–130)
BUN: 11 mg/dL (ref 7–25)
CALCIUM: 9.7 mg/dL (ref 8.6–10.4)
CO2: 26 mmol/L (ref 20–32)
CREATININE: 0.67 mg/dL (ref 0.50–1.05)
Chloride: 104 mmol/L (ref 98–110)
GFR, EST NON AFRICAN AMERICAN: 100 mL/min/{1.73_m2} (ref >=60)
GFR, Est African American: 115 mL/min/{1.73_m2} (ref >=60)
GLOBULIN: 2.7 g/dL (ref 1.9–3.7)
Glucose, Bld: 111 mg/dL — ABNORMAL HIGH (ref 65–99)
Potassium: 4.2 mmol/L (ref 3.5–5.3)
SODIUM: 142 mmol/L (ref 135–146)
Total Bilirubin: 0.3 mg/dL (ref 0.2–1.2)
Total Protein: 6.8 g/dL (ref 6.1–8.1)

## 2017-12-24 LAB — CBC WITH DIFFERENTIAL/PLATELET
BASOS PCT: 0.6 %
Basophils Absolute: 40 cells/uL (ref 0–200)
EOS PCT: 3.8 %
Eosinophils Absolute: 255 cells/uL (ref 15–500)
HEMATOCRIT: 37.4 % (ref 35.0–45.0)
HEMOGLOBIN: 12.9 g/dL (ref 11.7–15.5)
LYMPHS ABS: 1970 {cells}/uL (ref 850–3900)
MCH: 27.4 pg (ref 27.0–33.0)
MCHC: 34.5 g/dL (ref 32.0–36.0)
MCV: 79.6 fL — ABNORMAL LOW (ref 80.0–100.0)
MPV: 10.4 fL (ref 7.5–12.5)
Monocytes Relative: 6.9 %
NEUTROS ABS: 3973 {cells}/uL (ref 1500–7800)
Neutrophils Relative %: 59.3 %
Platelets: 334 10*3/uL (ref 140–400)
RBC: 4.7 10*6/uL (ref 3.80–5.10)
RDW: 14.2 % (ref 11.0–15.0)
Total Lymphocyte: 29.4 %
WBC mixed population: 462 cells/uL (ref 200–950)
WBC: 6.7 10*3/uL (ref 3.8–10.8)

## 2017-12-24 MED FILL — HYDROXYCHLOROQUINE 200 MG: 200 | 30 days supply | Qty: 60 | Fill #1

## 2017-12-25 ENCOUNTER — Ambulatory Visit: Payer: 59 | Admitting: Rheumatology

## 2017-12-25 NOTE — Progress Notes (Signed)
wnl

## 2018-01-03 MED FILL — ACCU-CHEK GUIDE TEST STRIP: 30 days supply | Qty: 100 | Fill #1

## 2018-01-03 MED FILL — ACCU-CHEK FASTCLIX LANCETS: 30 days supply | Qty: 102 | Fill #1

## 2018-01-07 MED FILL — ALPRAZolam 0.25 MG TABS: 0.25 | 14 days supply | Qty: 40 | Fill #0

## 2018-01-08 DIAGNOSIS — L308 Other specified dermatitis: Secondary | ICD-10-CM | POA: Diagnosis not present

## 2018-01-08 DIAGNOSIS — L400 Psoriasis vulgaris: Secondary | ICD-10-CM | POA: Diagnosis not present

## 2018-01-08 MED FILL — FLUOCINOLONE 0.01% SCALP OI: 0.01 | 30 days supply | Qty: 118 | Fill #0

## 2018-01-13 ENCOUNTER — Encounter: Payer: Self-pay | Admitting: *Deleted

## 2018-01-13 DIAGNOSIS — L409 Psoriasis, unspecified: Secondary | ICD-10-CM | POA: Insufficient documentation

## 2018-01-14 DIAGNOSIS — Z6837 Body mass index (BMI) 37.0-37.9, adult: Secondary | ICD-10-CM | POA: Diagnosis not present

## 2018-01-14 DIAGNOSIS — Z01419 Encounter for gynecological examination (general) (routine) without abnormal findings: Secondary | ICD-10-CM | POA: Diagnosis not present

## 2018-01-15 ENCOUNTER — Encounter: Payer: 59 | Admitting: Family Medicine

## 2018-01-19 IMAGING — CR DG CHEST 2V
2 series · 2 of 2 positions shown · non-contrast
Comparison: None.

CLINICAL DATA: Chest pain, severe right arm pain, shortness of
Breath

EXAM:
CHEST  2 VIEW

[chest pa]
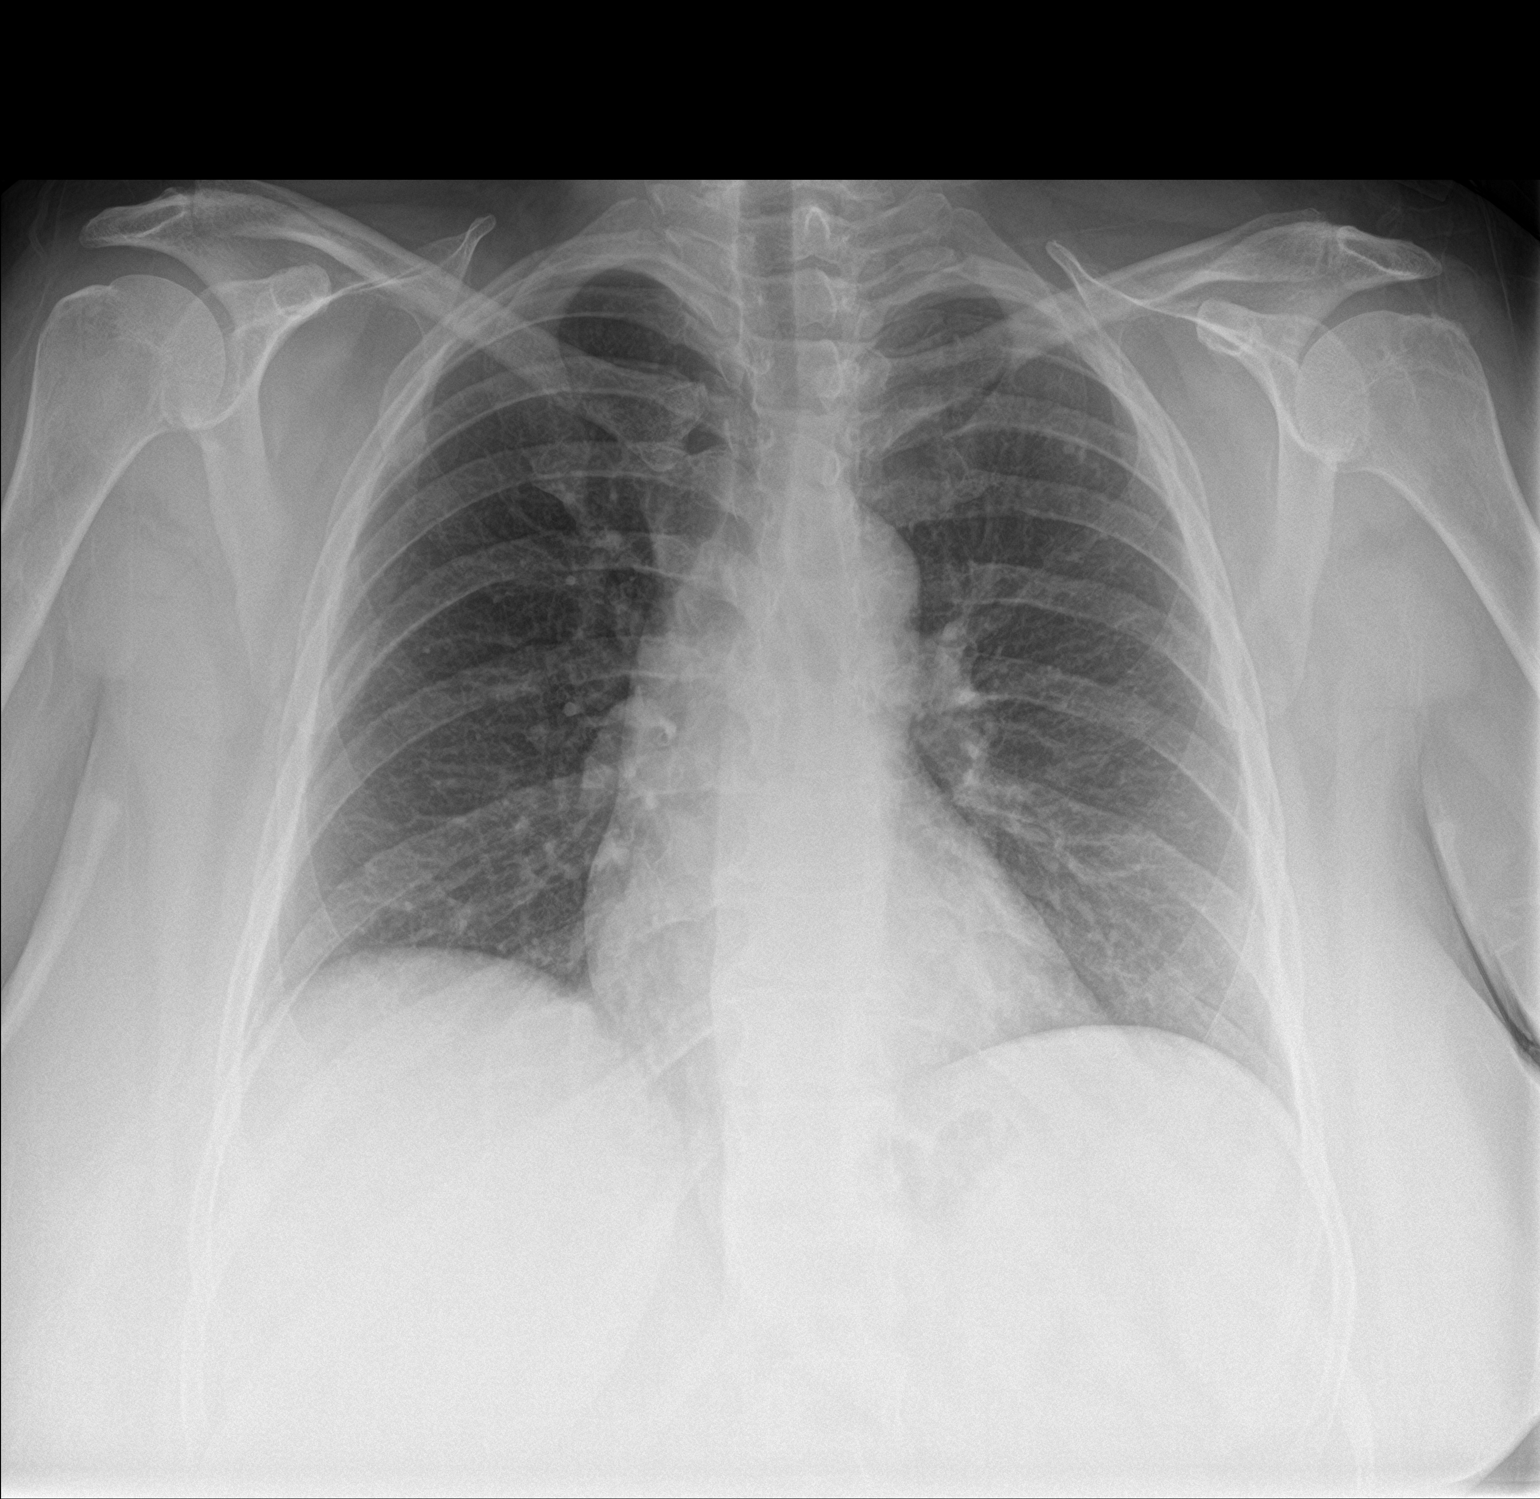

[chest lat]
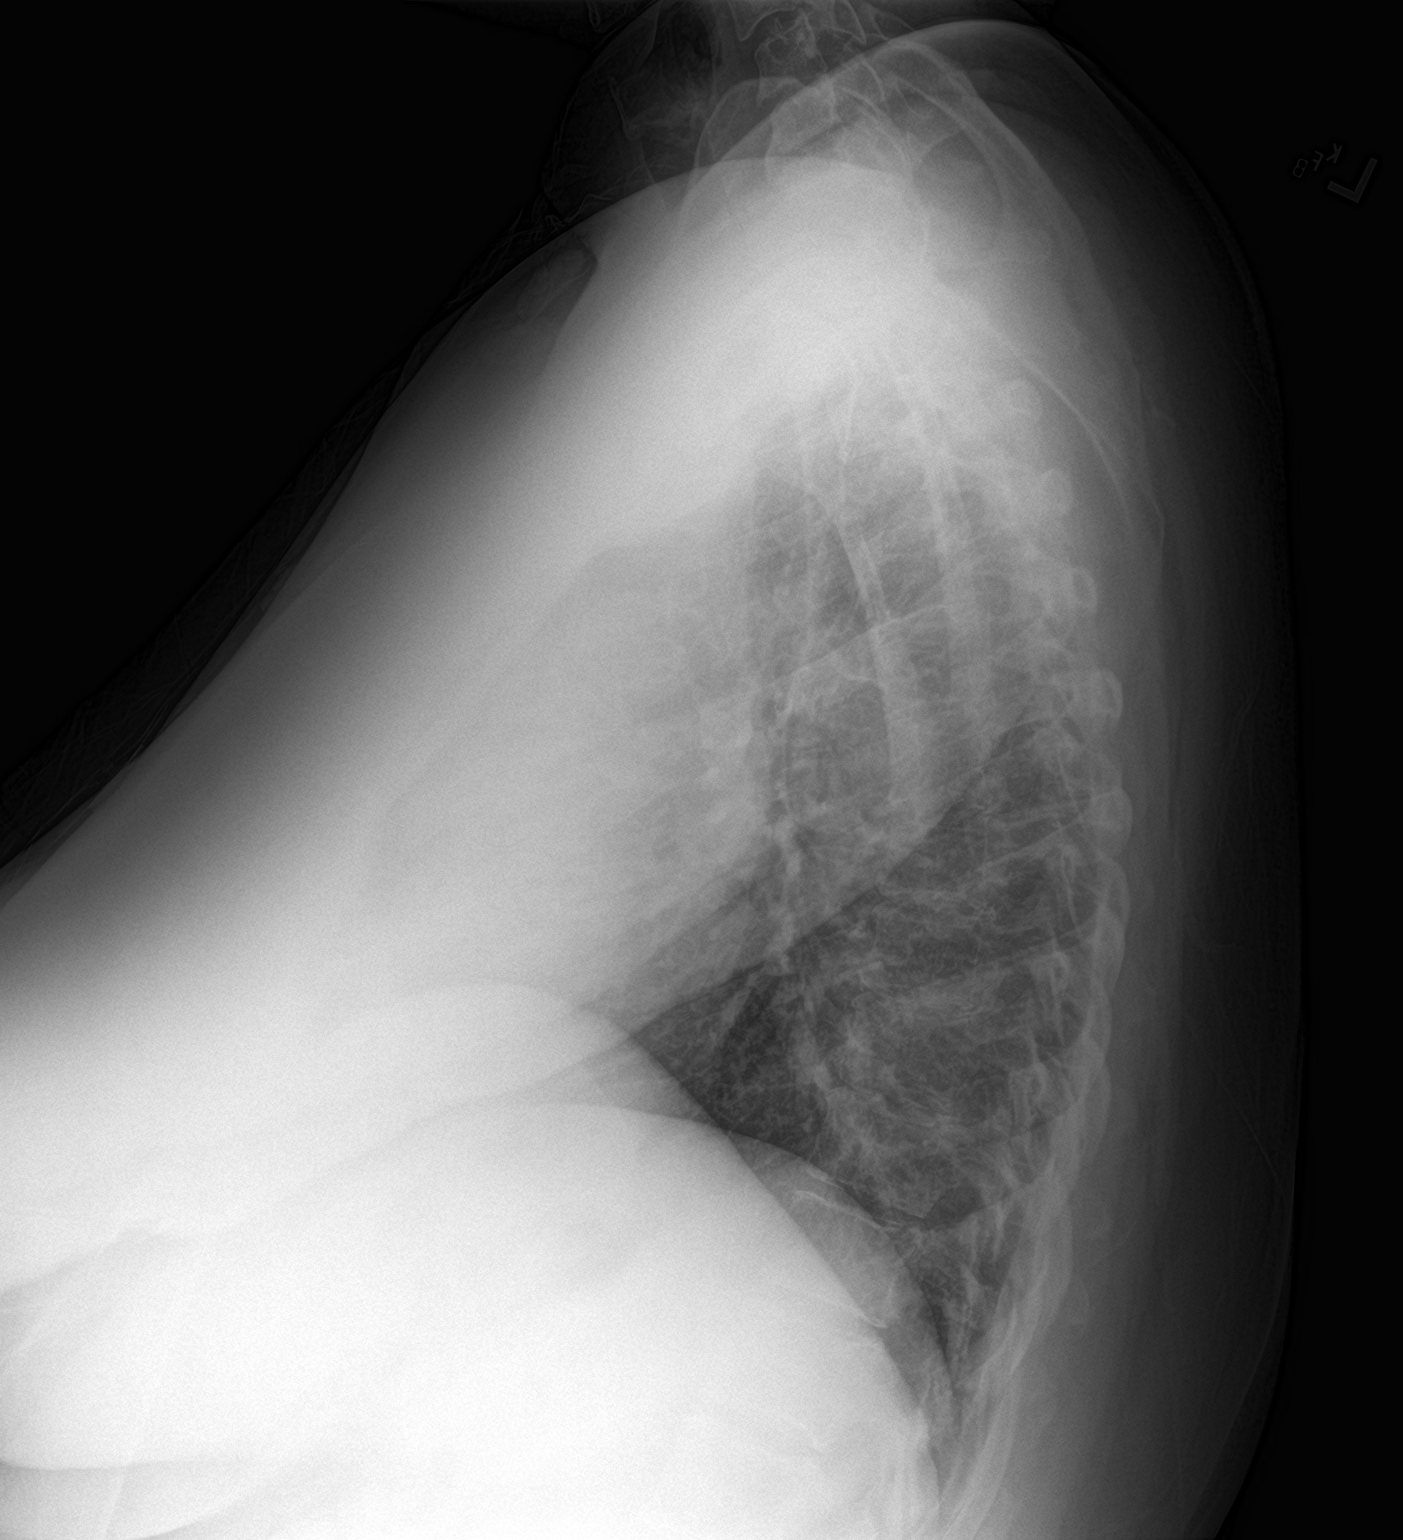

[2 of 2 positions shown; findings below may reference images not displayed]

FINDINGS: Mild peribronchial thickening. Heart and mediastinal contours are
within normal limits. No focal opacities or effusions. No acute bony
abnormality.
IMPRESSION: Mild bronchitic changes.

## 2018-01-23 MED FILL — HYDROXYCHLOROQUINE 200 MG: 200 | 30 days supply | Qty: 60 | Fill #2

## 2018-02-03 ENCOUNTER — Other Ambulatory Visit: Payer: Self-pay | Admitting: Family Medicine

## 2018-02-03 MED FILL — ESOMEPRAZOLE MAG DR 40 MG C: 40 | 90 days supply | Qty: 90 | Fill #1

## 2018-02-03 MED FILL — IBUPROFEN 800 MG TAB: 800 | 90 days supply | Qty: 180 | Fill #1

## 2018-02-03 MED FILL — ACCU-CHEK FASTCLIX LANCETS: 30 days supply | Qty: 102 | Fill #0

## 2018-02-03 MED FILL — metFORMIN HCL 500 MG TABS: 500 | 45 days supply | Qty: 180 | Fill #2

## 2018-02-03 MED FILL — ACCU-CHEK GUIDE TEST STRIP: 30 days supply | Qty: 100 | Fill #2

## 2018-02-03 MED FILL — LOSARTAN-HCTZ 50-12.5 MG TA: 50-12.5 | 90 days supply | Qty: 45 | Fill #1

## 2018-02-19 ENCOUNTER — Encounter: Payer: Self-pay | Admitting: Family Medicine

## 2018-02-19 ENCOUNTER — Ambulatory Visit (INDEPENDENT_AMBULATORY_CARE_PROVIDER_SITE_OTHER): Payer: 59 | Admitting: Family Medicine

## 2018-02-19 ENCOUNTER — Encounter: Payer: Self-pay | Admitting: *Deleted

## 2018-02-19 ENCOUNTER — Other Ambulatory Visit: Payer: Self-pay

## 2018-02-19 VITALS — BP 138/86 | HR 82 | Temp 98.0°F | Resp 12 | Ht 67.0 in | Wt 240.0 lb

## 2018-02-19 DIAGNOSIS — E119 Type 2 diabetes mellitus without complications: Secondary | ICD-10-CM

## 2018-02-19 DIAGNOSIS — Z23 Encounter for immunization: Secondary | ICD-10-CM | POA: Diagnosis not present

## 2018-02-19 DIAGNOSIS — F411 Generalized anxiety disorder: Secondary | ICD-10-CM | POA: Diagnosis not present

## 2018-02-19 DIAGNOSIS — E669 Obesity, unspecified: Secondary | ICD-10-CM

## 2018-02-19 DIAGNOSIS — Z1211 Encounter for screening for malignant neoplasm of colon: Secondary | ICD-10-CM | POA: Diagnosis not present

## 2018-02-19 DIAGNOSIS — E782 Mixed hyperlipidemia: Secondary | ICD-10-CM

## 2018-02-19 DIAGNOSIS — S161XXA Strain of muscle, fascia and tendon at neck level, initial encounter: Secondary | ICD-10-CM

## 2018-02-19 DIAGNOSIS — I10 Essential (primary) hypertension: Secondary | ICD-10-CM | POA: Diagnosis not present

## 2018-02-19 DIAGNOSIS — Z0000 Encounter for general adult medical examination without abnormal findings: Secondary | ICD-10-CM | POA: Diagnosis not present

## 2018-02-19 MED ORDER — ALPRAZOLAM 0.25 MG PO TABS: ORAL_TABLET | ORAL | 0 refills | Status: DC

## 2018-02-19 MED FILL — ALPRAZolam 0.25 MG TABS: 0.25 | 23 days supply | Qty: 45 | Fill #0

## 2018-02-19 NOTE — Assessment & Plan Note (Signed)
GYn was filling, request refill uses sparingly

## 2018-02-19 NOTE — Assessment & Plan Note (Signed)
bp looks okay, no changes, she missed a few doses

## 2018-02-19 NOTE — Assessment & Plan Note (Signed)
CBG readings are much improved Continue metformin on ARB/ ASA Obtain Eye report Check lipids, decide on statin drug

## 2018-02-19 NOTE — Progress Notes (Signed)
Office Visit Note  Patient: Kelly [name]             Date of Birth: Aug 07, 1961           MRN: [id number]             PCP: Alycia Rossetti, MD Referring: Alycia Rossetti, MD Visit Date: 03/05/2018 Occupation: @GUAROCC @    Subjective:  Medication Management   History of Present Illness: Kelly [name] is a 57 y.o. female history of rheumatoid arthritis and osteoarthritis overlap.  She continues to have pain and discomfort in her bilateral hands left knee and left foot.  She states she has swelling in her right index finger and her left fifth finger.  She continues to have some swelling on her left knee joint.  He has been tolerating Plaquenil without any side effects.  Activities of Daily Living:  Patient reports morning stiffness for 5 minutes.   Patient Reports nocturnal pain.  Left knee Difficulty dressing/grooming: Denies Difficulty climbing stairs: Denies Difficulty getting out of chair: Denies Difficulty using hands for taps, buttons, cutlery, and/or writing: Denies   Review of Systems  Constitutional: Positive for fatigue. Negative for night sweats, weight gain and weight loss.  HENT: Negative for mouth sores, trouble swallowing, trouble swallowing, mouth dryness and nose dryness.   Eyes: Negative for pain, redness, visual disturbance and dryness.  Respiratory: Negative for cough, shortness of breath and difficulty breathing.   Cardiovascular: Negative for chest pain, palpitations, hypertension, irregular heartbeat and swelling in legs/feet.  Gastrointestinal: Negative for blood in stool, constipation and diarrhea.  Endocrine: Negative for increased urination.  Genitourinary: Negative for vaginal dryness.  Musculoskeletal: Positive for arthralgias, joint pain, joint swelling and morning stiffness. Negative for myalgias, muscle weakness, muscle tenderness and myalgias.  Skin: Negative for color change, rash, hair loss, skin tightness, ulcers and sensitivity to sunlight.    Allergic/Immunologic: Negative for susceptible to infections.  Neurological: Negative for dizziness, memory loss, night sweats and weakness.  Hematological: Negative for swollen glands.  Psychiatric/Behavioral: Negative for depressed mood and sleep disturbance. The patient is not nervous/anxious.     PMFS History:  Patient Active Problem List   Diagnosis Date Noted  . GAD (generalized anxiety disorder) 02/19/2018  . Psoriasis 01/13/2018  . Rheumatoid arthritis involving multiple sites with positive rheumatoid factor ([place]) 11/22/2017  . Diabetes mellitus, type II ([place]) 11/04/2017  . Bronchial stenosis   . Essential hypertension 11/29/2015  . 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 Hemorrhoids, internal 03/04/2013  . External hemorrhoids with pain 03/04/2013  . Depression   . Seasonal allergies   . Irritable bowel syndrome with constipation >> diarrhea   . Headache disorder   . GERD (gastroesophageal reflux disease) 01/28/2013  . Chest pain 08/08/2012  . Hyperlipidemia 08/08/2012  . Obesity (BMI 30-39.9) 08/08/2012    Past Medical History:  Diagnosis Date  . Allergy   . Anxiety   . Depression   . Diabetes mellitus without complication ([place])   . GERD (gastroesophageal reflux disease)   . Headache disorder   . Hyperlipidemia   . Hypertension   . IBS (irritable bowel syndrome)   . Obesity   . Salivary calculus   . Seasonal allergies     Family History  Problem Relation Age of Onset  . Heart attack Father        MI at age 34  . Coronary artery disease Brother   . Diabetes Brother   . Coronary artery disease  Brother   . Heart disease Brother   . Lung cancer Mother 33       +TOBACCO, had quit many years before diagnosis  . COPD Brother        ?Melanoma  . Lung cancer Unknown   . Hypertension Unknown    Past Surgical History:  Procedure Laterality Date  . ABDOMINAL HYSTERECTOMY  2001   partial hysterectomy  . REDUCTION MAMMAPLASTY Bilateral  04/25/2015  . SALIVARY GLAND SURGERY    . TEMPOROMANDIBULAR JOINT SURGERY     3 times   Social History   Social History Narrative   Lives with her fiance. He has children.   Raised a niece after her brother died, she is now adult.     Objective: Vital Signs: BP (!) 155/97 (BP Location: Left Arm, Patient Position: Sitting, Cuff Size: Large)   Pulse 83   Resp 17   Ht 5' 7"  (1.702 m)   Wt 250 lb (113.4 kg)   BMI 39.16 kg/m    Physical Exam  Constitutional: She is oriented to person, place, and time. She appears well-developed and well-nourished.  HENT:  Head: Normocephalic and atraumatic.  Eyes: Conjunctivae and EOM are normal.  Neck: Normal range of motion.  Cardiovascular: Normal rate, regular rhythm, normal heart sounds and intact distal pulses.  Pulmonary/Chest: Effort normal and breath sounds normal.  Abdominal: Soft. Bowel sounds are normal.  Lymphadenopathy:    She has no cervical adenopathy.  Neurological: She is alert and oriented to person, place, and time.  Skin: Skin is warm and dry. Capillary refill takes less than 2 seconds.  Psychiatric: She has a normal mood and affect. Her behavior is normal.  Nursing note and vitals reviewed.    Musculoskeletal Exam: C-spine thoracic lumbar spine good range of motion.  Shoulder joints elbow joints were in good range of motion.  Synovitis and noted in her PIP joints as described below.  She also had warmth and swelling in her left knee joint.  She is tenderness over left fifth metatarsal base.  CDAI Exam: CDAI Homunculus Exam:   Tenderness:  Right hand: 2nd PIP Left hand: 5th PIP LLE: tibiofemoral  Swelling:  Right hand: 2nd PIP Left hand: 5th PIP LLE: tibiofemoral  Joint Counts:  CDAI Tender Joint count: 3 CDAI Swollen Joint count: 3  Global Assessments:  Patient Global Assessment: 5 Provider Global Assessment: 5  CDAI Calculated Score: 16    Investigation: No additional findings.PLQ eye exam:  12/24/2017 CBC Latest Ref Rng & Units 02/19/2018 12/24/2017 11/16/2017  WBC 3.8 - 10.8 Thousand/uL 6.4 6.7 9.2  Hemoglobin 11.7 - 15.5 g/dL 13.1 12.9 13.6  Hematocrit 35.0 - 45.0 % 38.3 37.4 40.4  Platelets 140 - 400 Thousand/uL 353 334 371   CMP Latest Ref Rng & Units 02/19/2018 12/24/2017 11/16/2017  Glucose 65 - 99 mg/dL 104(H) 111(H) 166(H)  BUN 7 - 25 mg/dL 13 11 9   Creatinine 0.50 - 1.05 mg/dL 0.74 0.67 0.53  Sodium 135 - 146 mmol/L 140 142 135  Potassium 3.5 - 5.3 mmol/L 3.9 4.2 3.3(L)  Chloride 98 - 110 mmol/L 103 104 100(L)  CO2 20 - 32 mmol/L 28 26 22   Calcium 8.6 - 10.4 mg/dL 9.7 9.7 9.3  Total Protein 6.1 - 8.1 g/dL 6.7 6.8 -  Total Bilirubin 0.2 - 1.2 mg/dL 0.4 0.3 -  Alkaline Phos 39 - 117 U/L - - -  AST 10 - 35 U/L 16 23 -  ALT 6 - 29 U/L 20  27 -    Imaging: No results found.  Speciality Comments: PLQ Eye Exam: 12/24/17 WNL @ [facility] Follow up in 1 year    Procedures:  No procedures performed Allergies: Codeine; Topamax [topiramate]; and Maxalt [rizatriptan]   Assessment / Plan:     Visit Diagnoses: Rheumatoid arthritis involving multiple sites with positive rheumatoid factor (HCC) - Positive RF, positive anti-CCP, elevated ESR, arthritis.  She continues to have some pain and stiffness in her joints and has some synovitis on examination.  Her symptoms are not adequately controlled with Plaquenil.  Different treatment options and their side effects were discussed at length.  She is hesitant to go on methotrexate.  We discussed the option of sulfasalazine.  Indications side effects contraindications were reviewed.  A handout was given and consent was taken.  Patient decided to hold off sulfasalazine for right now.  She wants to try some natural alternatives.  Medication counseling:  G6PD: November 15, 2017 Patient was counseled on the purpose, proper use, and adverse effects of sulfasalazine including risk of infection and chance of nausea, headache, and sun  sensitivity.  Also discussed risk of skin rash and advised patient to stop the medication and let us know if she develops a rash. Also discussed for the potential of discoloration of the urine, sweat, or tears.  Reviewed the importance of frequent labs to monitor liver, kidneys, and blood counts; and provided patient with standing lab instructions.  Provided patient with educational materials on sulfasalazine and answered all questions.     High risk medication use - PLQeye exam: 12/24/2017.  Her labs have been stable.  Primary osteoarthritis of both hands-she continues to have some stiffness in her hands.  Unilateral primary osteoarthritis, right hip-she has some limitation with range of motion and discomfort.  Primary osteoarthritis of both feet-she has some stiffness in her feet.  History of hypertension-her blood pressure is elevated.  She should have blood pressure monitored and follow-up with the PCP.  History of gastroesophageal reflux (GERD)  History of diabetes mellitus-according to patient her diabetes is better controlled now.  History of IBS - with constipation >> diarrhea  History of hyperlipidemia  History of obesity  History of renal cell carcinoma    Orders: No orders of the defined types were placed in this encounter.  No orders of the defined types were placed in this encounter.   Face-to-face time spent with patient was 30 minutes.  Greater than 50% of time was spent in counseling and coordination of care.  Follow-Up Instructions: Return for Rheumatoid arthritis, Osteoarthritis.   Bo Merino, MD  Note - This record has been created using Editor, commissioning.  Chart creation errors have been sought, but may not always  have been located. Such creation errors do not reflect on  the standard of medical care.
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
## 2018-02-19 NOTE — Assessment & Plan Note (Signed)
She continues to work on weight loss Dietary changes, is making good progress

## 2018-02-19 NOTE — Patient Instructions (Addendum)
Release of records- Dr. Wyatt Haste [facility]  Release of records- [facility] for last Mammogram, PAP/[facility]  Cologuard to be  Sent We will call with lab results No change to medications Pneumonia vaccine given Plan for shingrix at next visit  F/U 3 months

## 2018-02-20 ENCOUNTER — Telehealth: Payer: Self-pay | Admitting: *Deleted

## 2018-02-20 DIAGNOSIS — R195 Other fecal abnormalities: Secondary | ICD-10-CM

## 2018-02-20 LAB — LIPID PANEL
CHOL/HDL RATIO: 4.4 (calc) (ref <5.0)
CHOLESTEROL: 206 mg/dL — AB (ref <200)
HDL: 47 mg/dL — AB (ref >50)
LDL CHOLESTEROL (CALC): 136 mg/dL — AB
Non-HDL Cholesterol (Calc): 159 mg/dL (calc) — ABNORMAL HIGH (ref <130)
Triglycerides: 121 mg/dL (ref <150)

## 2018-02-20 LAB — COMPREHENSIVE METABOLIC PANEL
AG Ratio: 1.8 (calc) (ref 1.0–2.5)
ALBUMIN MSPROF: 4.3 g/dL (ref 3.6–5.1)
ALT: 20 U/L (ref 6–29)
AST: 16 U/L (ref 10–35)
Alkaline phosphatase (APISO): 97 U/L (ref 33–130)
BILIRUBIN TOTAL: 0.4 mg/dL (ref 0.2–1.2)
BUN: 13 mg/dL (ref 7–25)
CO2: 28 mmol/L (ref 20–32)
CREATININE: 0.74 mg/dL (ref 0.50–1.05)
Calcium: 9.7 mg/dL (ref 8.6–10.4)
Chloride: 103 mmol/L (ref 98–110)
Globulin: 2.4 g/dL (calc) (ref 1.9–3.7)
Glucose, Bld: 104 mg/dL — ABNORMAL HIGH (ref 65–99)
POTASSIUM: 3.9 mmol/L (ref 3.5–5.3)
SODIUM: 140 mmol/L (ref 135–146)
TOTAL PROTEIN: 6.7 g/dL (ref 6.1–8.1)

## 2018-02-20 LAB — CBC WITH DIFFERENTIAL/PLATELET
BASOS ABS: 51 {cells}/uL (ref 0–200)
Basophils Relative: 0.8 %
EOS PCT: 2.8 %
Eosinophils Absolute: 179 cells/uL (ref 15–500)
HEMATOCRIT: 38.3 % (ref 35.0–45.0)
HEMOGLOBIN: 13.1 g/dL (ref 11.7–15.5)
LYMPHS ABS: 1843 {cells}/uL (ref 850–3900)
MCH: 27.5 pg (ref 27.0–33.0)
MCHC: 34.2 g/dL (ref 32.0–36.0)
MCV: 80.5 fL (ref 80.0–100.0)
MPV: 10 fL (ref 7.5–12.5)
Monocytes Relative: 8 %
NEUTROS ABS: 3814 {cells}/uL (ref 1500–7800)
NEUTROS PCT: 59.6 %
Platelets: 353 10*3/uL (ref 140–400)
RBC: 4.76 10*6/uL (ref 3.80–5.10)
RDW: 13.8 % (ref 11.0–15.0)
Total Lymphocyte: 28.8 %
WBC mixed population: 512 cells/uL (ref 200–950)
WBC: 6.4 10*3/uL (ref 3.8–10.8)

## 2018-02-20 LAB — HEMOGLOBIN A1C
Hgb A1c MFr Bld: 6.1 % of total Hgb — ABNORMAL HIGH (ref <5.7)
Mean Plasma Glucose: 128 (calc)
eAG (mmol/L): 7.1 (calc)

## 2018-02-20 NOTE — Telephone Encounter (Signed)
Received verbal orders for Cologuard.   Order placed via [facility].   Order [id number] has been submitted.

## 2018-02-24 ENCOUNTER — Other Ambulatory Visit: Payer: Self-pay | Admitting: Rheumatology

## 2018-02-24 MED FILL — HYDROXYCHLOROQUINE SULFATE: 200 | 30 days supply | Qty: 60 | Fill #0

## 2018-02-24 NOTE — Telephone Encounter (Signed)
Last Visit: 11/28/17 Next visit: 03/05/18 Labs: 02/19/18 CBC/CMP WNL PLQ Eye Exam: 12/24/17 WNL  Okay to refill per Dr. Estanislado Pandy

## 2018-02-25 ENCOUNTER — Encounter: Payer: Self-pay | Admitting: *Deleted

## 2018-02-25 ENCOUNTER — Encounter: Payer: Self-pay | Admitting: Family Medicine

## 2018-02-26 ENCOUNTER — Encounter: Payer: Self-pay | Admitting: Family Medicine

## 2018-03-03 ENCOUNTER — Encounter: Payer: Self-pay | Admitting: *Deleted

## 2018-03-03 DIAGNOSIS — Z1212 Encounter for screening for malignant neoplasm of rectum: Secondary | ICD-10-CM | POA: Diagnosis not present

## 2018-03-03 DIAGNOSIS — Z1211 Encounter for screening for malignant neoplasm of colon: Secondary | ICD-10-CM | POA: Diagnosis not present

## 2018-03-04 MED FILL — FLUOCINOLONE ACETONIDE SCAL: 0.01 | 30 days supply | Qty: 118 | Fill #1

## 2018-03-04 MED FILL — ACCU-CHEK GUIDE TEST STRIP: 30 days supply | Qty: 100 | Fill #3

## 2018-03-04 MED FILL — ACCU-CHEK FASTCLIX LANCETS: 30 days supply | Qty: 102 | Fill #1

## 2018-03-05 ENCOUNTER — Encounter: Payer: Self-pay | Admitting: Rheumatology

## 2018-03-05 ENCOUNTER — Ambulatory Visit: Payer: 59 | Admitting: Rheumatology

## 2018-03-05 VITALS — BP 155/97 | HR 83 | Resp 17 | Ht 67.0 in | Wt 250.0 lb

## 2018-03-05 DIAGNOSIS — M0579 Rheumatoid arthritis with rheumatoid factor of multiple sites without organ or systems involvement: Secondary | ICD-10-CM

## 2018-03-05 DIAGNOSIS — Z85528 Personal history of other malignant neoplasm of kidney: Secondary | ICD-10-CM | POA: Diagnosis not present

## 2018-03-05 DIAGNOSIS — Z8719 Personal history of other diseases of the digestive system: Secondary | ICD-10-CM

## 2018-03-05 DIAGNOSIS — M1611 Unilateral primary osteoarthritis, right hip: Secondary | ICD-10-CM | POA: Diagnosis not present

## 2018-03-05 DIAGNOSIS — M19041 Primary osteoarthritis, right hand: Secondary | ICD-10-CM | POA: Diagnosis not present

## 2018-03-05 DIAGNOSIS — Z79899 Other long term (current) drug therapy: Secondary | ICD-10-CM | POA: Diagnosis not present

## 2018-03-05 DIAGNOSIS — Z8679 Personal history of other diseases of the circulatory system: Secondary | ICD-10-CM

## 2018-03-05 DIAGNOSIS — M19071 Primary osteoarthritis, right ankle and foot: Secondary | ICD-10-CM | POA: Diagnosis not present

## 2018-03-05 DIAGNOSIS — M19072 Primary osteoarthritis, left ankle and foot: Secondary | ICD-10-CM

## 2018-03-05 DIAGNOSIS — Z8639 Personal history of other endocrine, nutritional and metabolic disease: Secondary | ICD-10-CM

## 2018-03-05 DIAGNOSIS — M19042 Primary osteoarthritis, left hand: Secondary | ICD-10-CM

## 2018-03-05 NOTE — Patient Instructions (Addendum)
Natural anti-inflammatories  You can purchase these at [employer], [employer] or online.  . Turmeric (capsules)  . Ginger (ginger root or capsules)  . Omega 3 (Fish, flax seeds, chia seeds, walnuts, almonds)  . Tart cherry (dried or extract)   Patient should be under the care of a physician while taking these supplements. This may not be reproduced without the permission of Dr. Bo Merino.   Sulfasalazine tablets What is this medicine? SULFASALAZINE (sul fa SAL a zeen) is used to treat ulcerative colitis. This medicine may be used for other purposes; ask your health care provider or pharmacist if you have questions. COMMON BRAND NAME(S): Azulfidine, Sulfazine What should I tell my health care provider before I take this medicine? They need to know if you have any of these conditions: -asthma -blood disorders or anemia -glucose-6-phosphate dehydrogenase (G6PD) deficiency -intestinal obstruction -kidney disease -liver disease -porphyria -urinary tract obstruction -an unusual reaction to sulfasalazine, sulfa drugs, salicylates, or other medicines, foods, dyes, or preservatives -pregnant or trying to get pregnant -breast-feeding How should I use this medicine? Take this medicine by mouth with a full glass of water. Follow the directions on the prescription label. If the medicine upsets your stomach, take it with food or milk. Take your medicine at regular intervals. Do not take your medicine more often than directed. Do not stop taking except on your doctor's advice. Talk to your pediatrician regarding the use of this medicine in children. While this drug may be prescribed for children as young as 6 years for selected conditions, precautions do apply. Patients over 38 years old may have a stronger reaction and need a smaller dose. Overdosage: If you think you have taken too much of this medicine contact a poison control center or emergency room at once. NOTE: This  medicine is only for you. Do not share this medicine with others. What if I miss a dose? If you miss a dose, take it as soon as you can. If it is almost time for your next dose, take only that dose. Do not take double or extra doses. What may interact with this medicine? -digoxin -folic acid This list may not describe all possible interactions. Give your health care provider a list of all the medicines, herbs, non-prescription drugs, or dietary supplements you use. Also tell them if you smoke, drink alcohol, or use illegal drugs. Some items may interact with your medicine. What should I watch for while using this medicine? Visit your doctor or health care professional for regular checks on your progress. You will need frequent blood and urine checks. This medicine can make you more sensitive to the sun. Keep out of the sun. If you cannot avoid being in the sun, wear protective clothing and use sunscreen. Do not use sun lamps or tanning beds/booths. Drink plenty of water while taking this medicine. What side effects may I notice from receiving this medicine? Side effects that you should report to your doctor or health care professional as soon as possible: -allergic reactions like skin rash, itching or hives, swelling of the face, lips, or tongue -fever, chills, or any other sign of infection -painful, difficult, or reduced urination -redness, blistering, peeling or loosening of the skin, including inside the mouth -severe stomach pain -unusual bleeding or bruising -unusually weak or tired -yellowing of the skin or eyes Side effects that usually do not require medical attention (report to your doctor or health care professional if they continue or are bothersome): -headache -loss of appetite -  nausea, vomiting -orange color to the urine -reduced sperm count This list may not describe all possible side effects. Call your doctor for medical advice about side effects. You may report side  effects to FDA at 1-800-FDA-1088. Where should I keep my medicine? Keep out of the reach of children. Store at room temperature between 15 and 30 degrees C (59 and 86 degrees F). Keep container tightly closed. Throw away any unused medicine after the expiration date. NOTE: This sheet is a summary. It may not cover all possible information. If you have questions about this medicine, talk to your doctor, pharmacist, or health care provider.  2018 Elsevier/Gold Standard (2008-06-23 11:38:15)

## 2018-03-16 LAB — COLOGUARD

## 2018-03-18 NOTE — Telephone Encounter (Signed)
Received Cologuard results.   Noted positive. MD reviewed and new orders given for GI referral for colonoscopy.   Referral orders placed.   Call placed to patient and patient made aware.

## 2018-03-18 NOTE — Addendum Note (Signed)
Addended by: Sheral Flow on: 03/18/2018 12:35 PM   Modules accepted: Orders

## 2018-03-23 ENCOUNTER — Encounter: Payer: Self-pay | Admitting: Rheumatology

## 2018-03-23 ENCOUNTER — Encounter: Payer: Self-pay | Admitting: Family Medicine

## 2018-03-24 ENCOUNTER — Ambulatory Visit (INDEPENDENT_AMBULATORY_CARE_PROVIDER_SITE_OTHER): Payer: Self-pay | Admitting: Family Medicine

## 2018-03-24 ENCOUNTER — Encounter: Payer: Self-pay | Admitting: Family Medicine

## 2018-03-24 ENCOUNTER — Other Ambulatory Visit: Payer: Self-pay | Admitting: Family Medicine

## 2018-03-24 VITALS — BP 128/82 | HR 83 | Temp 97.6°F | Resp 18 | Wt 251.6 lb

## 2018-03-24 DIAGNOSIS — M25562 Pain in left knee: Secondary | ICD-10-CM

## 2018-03-24 MED ORDER — BLOOD GLUCOSE TEST VI STRP: ORAL_STRIP | 1 refills | Status: DC

## 2018-03-24 MED ORDER — ACCU-CHEK FASTCLIX LANCETS MISC: 1 refills | Status: DC

## 2018-03-24 MED ORDER — MELOXICAM 15 MG PO TABS: 15.0000 mg | ORAL_TABLET | Freq: Every day | ORAL | 0 refills | Status: DC

## 2018-03-24 MED FILL — MELOXICAM 15 MG TABLET: 15 | 30 days supply | Qty: 30 | Fill #0

## 2018-03-24 MED FILL — HYDROXYCHLOROQUINE SULFATE: 200 | 30 days supply | Qty: 60 | Fill #1

## 2018-03-24 NOTE — Progress Notes (Signed)
Kelly [name] is a 57 y.o. female who presents today with concerns of acute knee pain x 1 weeks that began while moving in bed. She denies any recent or historical trauma. 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 She denies any surgeries on her knees. She not attempted any treatment up to this point for this condition.  Review of Systems  Constitutional: Negative for chills, fever and malaise/fatigue.  HENT: Negative for congestion, ear discharge, ear pain, sinus pain and sore throat.   Eyes: Negative.   Respiratory: Negative for cough, sputum production and shortness of breath.   Cardiovascular: Negative.  Negative for chest pain.  Gastrointestinal: Negative for abdominal pain, diarrhea, nausea and vomiting.  Genitourinary: Negative for dysuria, frequency, hematuria and urgency.  Musculoskeletal: Positive for joint pain. Negative for myalgias.       Left knee pain  Skin: Negative.   Neurological: Negative for headaches.  Endo/Heme/Allergies: Negative.   Psychiatric/Behavioral: Negative.     O: Vitals:   03/24/18 1544  BP: 128/82  Pulse: 83  Resp: 18  Temp: 97.6 F (36.4 C)  SpO2: 98%     Physical Exam  Constitutional: She is oriented to person, place, and time. Vital signs are normal. She appears well-developed and well-nourished. She is active.  Non-toxic appearance. She does not have a sickly appearance.  HENT:  Head: Normocephalic.  Right Ear: Hearing, tympanic membrane, external ear and ear canal normal.  Left Ear: Hearing, tympanic membrane, external ear and ear canal normal.  Nose: Nose normal.  Mouth/Throat: Uvula is midline and oropharynx is clear and moist.  Neck: Normal range of motion. Neck supple.  Cardiovascular: Normal rate, regular rhythm, normal heart sounds and normal pulses.  Pulmonary/Chest: Effort normal and breath sounds normal.  Abdominal: Soft. Bowel sounds are normal.  Musculoskeletal:       Left knee:  She exhibits decreased range of motion, swelling and effusion. She exhibits no ecchymosis, no deformity, no laceration, no erythema, normal alignment, no LCL laxity, normal patellar mobility, no bony tenderness, normal meniscus and no MCL laxity. Tenderness found. No medial joint line, no lateral joint line, no MCL, no LCL and no patellar tendon tenderness noted.       Legs: Negative: Lachman's, McMurry's. Drawer  Lymphadenopathy:       Head (right side): No submental and no submandibular adenopathy present.       Head (left side): No submental and no submandibular adenopathy present.    She has no cervical adenopathy.  Neurological: She is alert and oriented to person, place, and time.  Psychiatric: She has a normal mood and affect.  Vitals reviewed.   A: 1. Acute pain of left knee     P: Knee wrapped and home care RICE instructions reviewed with patient, advised if knee not improved at the 10-14 day make to seek f/u care with PCP. Exam findings, diagnosis etiology and medication use and indications reviewed with patient. Follow- Up and discharge instructions provided. No emergent/urgent issues found on exam.  Patient verbalized understanding of information provided and agrees with plan of care (POC), all questions answered.  1. Acute pain of left knee - meloxicam (MOBIC) 15 MG tablet; Take 1 tablet (15 mg total) by mouth daily. Discussed use Tramadol that patient already has at home for prn breakthrough pain-provider and patient did discuss listed allergy to codeine that patient reports as nausea and vomiting-but denies rash or airway concerns.

## 2018-03-24 NOTE — Patient Instructions (Signed)
Knee Pain, Adult Many things can cause knee pain. The pain often goes away on its own with time and rest. If the pain does not go away, tests may be done to find out what is causing the pain. Follow these instructions at home: Activity  Rest your knee.  Do not do things that cause pain.  Avoid activities where both feet leave the ground at the same time (high-impact activities). Examples are running, jumping rope, and doing jumping jacks. General instructions  Take medicines only as told by your doctor.  Raise (elevate) your knee when you are resting. Make sure your knee is higher than your heart.  Sleep with a pillow under your knee.  If told, put ice on the knee: ? Put ice in a plastic bag. ? Place a towel between your skin and the bag. ? Leave the ice on for 20 minutes, 2-3 times a day.  Ask your doctor if you should wear an elastic knee support.  Lose weight if you are overweight. Being overweight can make your knee hurt more.  Do not use any tobacco products. These include cigarettes, chewing tobacco, or electronic cigarettes. If you need help quitting, ask your doctor. Smoking may slow down healing. Contact a doctor if:  The pain does not stop.  The pain changes or gets worse.  You have a fever along with knee pain.  Your knee gives out or locks up.  Your knee swells, and becomes worse. Get help right away if:  Your knee feels warm.  You cannot move your knee.  You have very bad knee pain.  You have chest pain.  You have trouble breathing. Summary  Many things can cause knee pain. The pain often goes away on its own with time and rest.  Avoid activities that put stress on your knee. These include running and jumping rope.  Get help right away if you cannot move your knee, or if your knee feels warm, or if you have trouble breathing. This information is not intended to replace advice given to you by your health care provider. Make sure you discuss any  questions you have with your health care provider. Document Released: 01/18/2009 Document Revised: 10/16/2016 Document Reviewed: 10/16/2016 Elsevier Interactive Patient Education  2017 [place] for Routine Care of Injuries Many injuries can be cared for using rest, ice, compression, and elevation (RICE therapy). Using RICE therapy can help to lessen pain and swelling. It can help your body to heal. Rest Reduce your normal activities and avoid using the injured part of your body. You can go back to your normal activities when you feel okay and your doctor says it is okay. Ice Do not put ice on your bare skin.  Put ice in a plastic bag.  Place a towel between your skin and the bag.  Leave the ice on for 20 minutes, 2-3 times a day.  Do this for as long as told by your doctor. Compression Compression means putting pressure on the injured area. This can be done with an elastic bandage. If an elastic bandage has been applied:  Remove and reapply the bandage every 3-4 hours or as told by your doctor.  Make sure the bandage is not wrapped too tight. Wrap the bandage more loosely if part of your body beyond the bandage is blue, swollen, cold, painful, or loses feeling (numb).  See your doctor if the bandage seems to make your problems worse.  Elevation Elevation means keeping the injured  area raised. Raise the injured area above your heart or the center of your chest if you can. When should I get help? You should get help if:  You keep having pain and swelling.  Your symptoms get worse.  Get help right away if: You should get help right away if:  You have sudden bad pain at or below the area of your injury.  You have redness or more swelling around your injury.  You have tingling or numbness at or below the injury that does not go away when you take off the bandage.  This information is not intended to replace advice given to you by your health care provider. Make sure  you discuss any questions you have with your health care provider. Document Released: 04/09/2008 Document Revised: 09/18/2016 Document Reviewed: 09/29/2014 Elsevier Interactive Patient Education  2017 [employer].

## 2018-03-26 ENCOUNTER — Telehealth: Payer: Self-pay

## 2018-03-26 ENCOUNTER — Encounter: Payer: Self-pay | Admitting: Gastroenterology

## 2018-03-26 NOTE — Telephone Encounter (Signed)
Patient states she's doing ok.

## 2018-04-01 MED FILL — ACCU-CHEK FASTCLIX LANCETS: 90 days supply | Qty: 306 | Fill #0

## 2018-04-01 MED FILL — ACCU-CHEK GUIDE STRP: 90 days supply | Qty: 300 | Fill #0

## 2018-04-08 NOTE — Telephone Encounter (Signed)
Not available

## 2018-04-21 LAB — HM DIABETES EYE EXAM

## 2018-04-21 MED FILL — HYDROXYCHLOROQUINE SULFATE: 200 | 30 days supply | Qty: 60 | Fill #2

## 2018-04-22 ENCOUNTER — Other Ambulatory Visit: Payer: Self-pay | Admitting: Family Medicine

## 2018-04-22 ENCOUNTER — Telehealth: Payer: Self-pay | Admitting: Rheumatology

## 2018-04-22 MED FILL — ALPRAZolam 0.25 MG TABS: 0.25 | 22 days supply | Qty: 45 | Fill #0

## 2018-04-22 NOTE — Telephone Encounter (Signed)
Ok to refill??  Last office visit/ refill 02/19/2018.

## 2018-04-22 NOTE — Telephone Encounter (Signed)
Patient called stating her left knee was hurting so she went to [facility] who diagnosed her with sprain/strain and prescribed Meloxican.  Patient states she took the medication for 15 days and it helped but the pain came right back.  Patient states she was also prescribed an ace bandage to wear for the swelling.  Patient is asking if she should make an appointment with Dr. Estanislado Pandy or if she should see an orthopedic doctor.

## 2018-04-22 NOTE — Telephone Encounter (Deleted)
Patient called stating her left knee was hurting so she went to [facility] who diagnosed her with sprain/strain and prescribed Meloxican.  Patient states she took the medication for 15 days and it helped but the pain came right back.  Patient states she was also prescribed an ace bandage to wear for the swelling.  Patient is asking  [facility] me on Meloxican which helped but came right back.  For 15 days.  Patient is asking  Ace bandagte around knee where it at home.  Should I see an orthopedic or does it sound rhuem.

## 2018-04-22 NOTE — Telephone Encounter (Signed)
error 

## 2018-04-23 NOTE — Telephone Encounter (Signed)
Spoke with patient and advised her she should see Orthopedics. Patient was transferred to the front  to schedule and appointment with Dr. Durward Fortes.

## 2018-04-24 ENCOUNTER — Ambulatory Visit (INDEPENDENT_AMBULATORY_CARE_PROVIDER_SITE_OTHER): Payer: 59 | Admitting: Orthopaedic Surgery

## 2018-04-24 ENCOUNTER — Ambulatory Visit (INDEPENDENT_AMBULATORY_CARE_PROVIDER_SITE_OTHER): Payer: 59

## 2018-04-24 ENCOUNTER — Encounter (INDEPENDENT_AMBULATORY_CARE_PROVIDER_SITE_OTHER): Payer: Self-pay | Admitting: Orthopaedic Surgery

## 2018-04-24 VITALS — BP 147/94 | HR 93 | Ht 67.0 in | Wt 250.0 lb

## 2018-04-24 DIAGNOSIS — M25562 Pain in left knee: Secondary | ICD-10-CM

## 2018-04-24 DIAGNOSIS — G8929 Other chronic pain: Secondary | ICD-10-CM

## 2018-04-24 NOTE — Progress Notes (Signed)
Office Visit Note   Patient: Kelly [name]           Date of Birth: 1960-11-15           MRN: [id number] Visit Date: 04/24/2018              Requested by: Alycia Rossetti, MD [address] [place], [place] [address] PCP: Alycia Rossetti, MD   Assessment & Plan: Visit Diagnoses:  1. Chronic pain of left knee     Plan: Osteoarthritis left knee.  Long discussion regarding diagnosis and treatment options.  I reviewed the films Mrs. Ronnald Ramp.  We have discussed the use of NSAIDs, exercise and weight loss.  I have also discussed cortisone injections and Visco supplementation.  The moment she is happy with the weight loss exercises and NSAIDs.  We will plan to see her back as needed  Follow-Up Instructions: No follow-ups on file.   Orders:  Orders Placed This Encounter  Procedures  . XR KNEE 3 VIEW LEFT   No orders of the defined types were placed in this encounter.     Procedures: No procedures performed   Clinical Data: No additional findings.   Subjective: Chief Complaint  Patient presents with  . Left Knee - Pain  . Follow-up    L KNEE PAIN FOR FEW MONTHS, NO INJECTIONS  57 year old female with insidious onset of left knee pain several months ago without history of injury or trauma.  Her pain seems to be localized along the medial compartment.  She recently has been diagnosed with rheumatoid arthritis per Dr. Estanislado Pandy and has been taking meloxicam.  This seems to make a difference in her knee.  She has had a little bit of swelling.  No feeling of her knee giving way or locking.  HPI  Review of Systems  Constitutional: Negative for fatigue and fever.  HENT: Negative for ear pain.   Eyes: Negative for pain.  Respiratory: Negative for cough and shortness of breath.   Cardiovascular: Positive for leg swelling.  Gastrointestinal: Negative for constipation and diarrhea.  Genitourinary: Negative for difficulty urinating.  Musculoskeletal: Positive for neck pain.  Negative for back pain.  Skin: Negative for rash.  Allergic/Immunologic: Negative for food allergies.  Neurological: Positive for weakness. Negative for numbness.  Hematological: Does not bruise/bleed easily.  Psychiatric/Behavioral: Positive for sleep disturbance.     Objective: Vital Signs: BP (!) 147/94 (BP Location: Left Arm, Patient Position: Sitting, Cuff Size: Normal)   Pulse 93   Ht 5\' 7"  (1.702 m)   Wt 250 lb (113.4 kg)   BMI 39.16 kg/m   Physical Exam  Constitutional: She is oriented to person, place, and time. She appears well-developed and well-nourished.  HENT:  Mouth/Throat: Oropharynx is clear and moist.  Eyes: Pupils are equal, round, and reactive to light. EOM are normal.  Pulmonary/Chest: Effort normal.  Neurological: She is alert and oriented to person, place, and time.  Skin: Skin is warm and dry.  Psychiatric: She has a normal mood and affect. Her behavior is normal.    Ortho Exam awake alert and oriented x3.  Comfortable sitting.  Left knee without effusion.  Predominantly medial joint pain anteriorly.  No lateral joint pain.  No patella pain or crepitation.  No instability.  Full extension.  Flexion over 105 degrees.  No calf pain or popliteal pain.  No distal edema.  Neurovascular exam intact.  Straight leg raise negative.  Painless range of motion both hips  Specialty Comments:  No specialty comments available.  Imaging: Xr Knee 3 View Left  Result Date: 04/24/2018 Films of the left knee were obtained in 3 projections standing.  There is narrowing of the medial joint line associated with peripheral osteophytes and subchondral sclerosis.  There are some mild degenerative changes about the patellofemoral joint.  No acute changes.  No ectopic calcification.  Labs are consistent with moderate osteoarthritis predominantly in the  medial compartment    PMFS History: Patient Active Problem List   Diagnosis Date Noted  . GAD (generalized anxiety disorder)  02/19/2018  . Psoriasis 01/13/2018  . Rheumatoid arthritis involving multiple sites with positive rheumatoid factor ([place]) 11/22/2017  . Diabetes mellitus, type II ([place]) 11/04/2017  . Bronchial stenosis   . Essential hypertension 11/29/2015  . 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 Hemorrhoids, internal 03/04/2013  . External hemorrhoids with pain 03/04/2013  . Depression   . Seasonal allergies   . Irritable bowel syndrome with constipation >> diarrhea   . Headache disorder   . GERD (gastroesophageal reflux disease) 01/28/2013  . Chest pain 08/08/2012  . Hyperlipidemia 08/08/2012  . Obesity (BMI 30-39.9) 08/08/2012   Past Medical History:  Diagnosis Date  . Allergy   . Anxiety   . Depression   . Diabetes mellitus without complication ([place])   . GERD (gastroesophageal reflux disease)   . Headache disorder   . Hyperlipidemia   . Hypertension   . IBS (irritable bowel syndrome)   . Obesity   . Salivary calculus   . Seasonal allergies     Family History  Problem Relation Age of Onset  . Heart attack Father        MI at age 70  . Coronary artery disease Brother   . Diabetes Brother   . Coronary artery disease Brother   . Heart disease Brother   . Lung cancer Mother 56       +TOBACCO, had quit many years before diagnosis  . COPD Brother        ?Melanoma  . Lung cancer Unknown   . Hypertension Unknown     Past Surgical History:  Procedure Laterality Date  . ABDOMINAL HYSTERECTOMY  2001   partial hysterectomy  . REDUCTION MAMMAPLASTY Bilateral 04/25/2015  . SALIVARY GLAND SURGERY    . TEMPOROMANDIBULAR JOINT SURGERY     3 times   Social History   Occupational History  . Occupation: [employer]    Comment: [employer], [employer]  . Occupation: Forensic psychologist    Comment: Day Surgery  Tobacco Use  . Smoking status: Never Smoker  . Smokeless tobacco: Never Used  Substance and Sexual Activity  . Alcohol use: No    Alcohol/week: 0.0 oz     Frequency: Never  . Drug use: No  . Sexual activity: Not on file

## 2018-04-28 MED FILL — IBUPROFEN 800 MG TAB: 800 | 90 days supply | Qty: 180 | Fill #2

## 2018-04-28 MED FILL — ESOMEPRAZOLE MAG DR 40 MG C: 40 | 90 days supply | Qty: 90 | Fill #2

## 2018-04-28 MED FILL — metFORMIN HCL 500 MG TABS: 500 | 45 days supply | Qty: 180 | Fill #3

## 2018-04-28 MED FILL — LOSARTAN-HCTZ 50-12.5 MG TA: 50-12.5 | 90 days supply | Qty: 45 | Fill #2

## 2018-05-13 ENCOUNTER — Other Ambulatory Visit: Payer: Self-pay

## 2018-05-13 ENCOUNTER — Ambulatory Visit: Payer: 59 | Admitting: Family Medicine

## 2018-05-13 ENCOUNTER — Telehealth: Payer: Self-pay | Admitting: *Deleted

## 2018-05-13 ENCOUNTER — Encounter: Payer: Self-pay | Admitting: Family Medicine

## 2018-05-13 VITALS — BP 134/80 | HR 80 | Temp 97.9°F | Resp 16 | Ht 67.0 in | Wt 249.0 lb

## 2018-05-13 DIAGNOSIS — E669 Obesity, unspecified: Secondary | ICD-10-CM

## 2018-05-13 DIAGNOSIS — E119 Type 2 diabetes mellitus without complications: Secondary | ICD-10-CM | POA: Diagnosis not present

## 2018-05-13 DIAGNOSIS — I10 Essential (primary) hypertension: Secondary | ICD-10-CM

## 2018-05-13 DIAGNOSIS — E782 Mixed hyperlipidemia: Secondary | ICD-10-CM

## 2018-05-13 MED ORDER — VITAMIN B-12 2500 MCG SL SUBL: SUBLINGUAL_TABLET | SUBLINGUAL | 0 refills | Status: AC

## 2018-05-13 NOTE — Patient Instructions (Addendum)
F/U 4 months  Release of records- Dr[facility] [place] , need last EYE NOTE

## 2018-05-13 NOTE — Assessment & Plan Note (Signed)
Recheck lipids, goal LDL <100 

## 2018-05-13 NOTE — Progress Notes (Signed)
   Subjective:    Patient ID: Kelly [name], female    DOB: March 01, 1961, 57 y.o.   MRN: [id number]  Patient presents for Follow-up (is fasting)  DM- fasting CBG 117-124, evening low 100's , she is in the [facility] program, Last A1C 6.1% in April ,  LDL was 136   30 day fasting average 108, after meal average 104  HTN- taking hyzaar , bp readigs st home 116-142/73-96, HR 80-90  Obesity- walking 20-30 minutes, has OA in her left knee, has seen ortho, but did not want steroid shot, taking ibuprofen Has gained 9lbs past few months   Anxiety- taking xanax as prescribed    Review Of Systems:  GEN- denies fatigue, fever, weight loss,weakness, recent illness HEENT- denies eye drainage, change in vision, nasal discharge, CVS- denies chest pain, palpitations RESP- denies SOB, cough, wheeze ABD- denies N/V, change in stools, abd pain GU- denies dysuria, hematuria, dribbling, incontinence MSK- denies joint pain, muscle aches, injury Neuro- denies headache, dizziness, syncope, seizure activity       Objective:    BP 134/80   Pulse 80   Temp 97.9 F (36.6 C) (Oral)   Resp 16   Ht 5\' 7"  (1.702 m)   Wt 249 lb (112.9 kg)   SpO2 98%   BMI 39.00 kg/m  GEN- NAD, alert and oriented x3,obese  HEENT- PERRL, EOMI, non injected sclera, pink conjunctiva, MMM, oropharynx clear CVS- RRR, no murmur RESP-CTAB ABD-NABS,soft,NT,ND EXT- No edema Pulses- Radial, DP- 2+        Assessment & Plan:      Problem List Items Addressed This Visit      Unprioritized   Diabetes mellitus, type II ([place]) - Primary    Continue to improve.  Continue with the metformin.  Continue to work on significant weight loss. Obtain eye exam      Relevant Orders   Comprehensive metabolic panel   Hemoglobin A1c   Essential hypertension    Well controlled no changes       Hyperlipidemia    Recheck lipids, goal LDL < 100      Relevant Orders   Lipid panel   Obesity (BMI 30-39.9)    Discussed need for weight  loss, regular exercise, she is following diabetes program with [facility]         Note: This dictation was prepared with Dragon dictation along with smaller phrase technology. Any transcriptional errors that result from this process are unintentional.

## 2018-05-13 NOTE — Assessment & Plan Note (Addendum)
Continue to improve.  Continue with the metformin.  Continue to work on significant weight loss. Obtain eye exam

## 2018-05-13 NOTE — Telephone Encounter (Signed)
This needs to go to Dr. Bronson Curb who is treating her RA

## 2018-05-13 NOTE — Telephone Encounter (Signed)
Call placed to patient and patient made aware. Verbalized understanding.  

## 2018-05-13 NOTE — Telephone Encounter (Signed)
Received FMLA forms from [employer] from patient.   Call placed to patient for more information.   Job title: Admissions    Hours of Work: 40+ hours, [employer]/ [employer]  Reason FMLA requested: RA (M05.79),  joint pain/ swelling  Requested Beginning Date: intermittent  Verbalized that fee may be charged and is per provider prerogative.   Forms routed to provider.

## 2018-05-13 NOTE — Telephone Encounter (Signed)
Call placed to patient. LMTRC.  

## 2018-05-13 NOTE — Assessment & Plan Note (Signed)
Discussed need for weight loss, regular exercise, she is following diabetes program with [facility]

## 2018-05-13 NOTE — Assessment & Plan Note (Signed)
Well controlled no changes 

## 2018-05-14 LAB — COMPREHENSIVE METABOLIC PANEL
AG RATIO: 1.8 (calc) (ref 1.0–2.5)
ALT: 17 U/L (ref 6–29)
AST: 15 U/L (ref 10–35)
Albumin: 4.2 g/dL (ref 3.6–5.1)
Alkaline phosphatase (APISO): 95 U/L (ref 33–130)
BUN: 13 mg/dL (ref 7–25)
CO2: 28 mmol/L (ref 20–32)
Calcium: 9.5 mg/dL (ref 8.6–10.4)
Chloride: 103 mmol/L (ref 98–110)
Creat: 0.77 mg/dL (ref 0.50–1.05)
GLOBULIN: 2.4 g/dL (ref 1.9–3.7)
GLUCOSE: 106 mg/dL — AB (ref 65–99)
Potassium: 4.1 mmol/L (ref 3.5–5.3)
SODIUM: 140 mmol/L (ref 135–146)
TOTAL PROTEIN: 6.6 g/dL (ref 6.1–8.1)
Total Bilirubin: 0.4 mg/dL (ref 0.2–1.2)

## 2018-05-14 LAB — LIPID PANEL
Cholesterol: 209 mg/dL — ABNORMAL HIGH (ref <200)
HDL: 52 mg/dL (ref >50)
LDL CHOLESTEROL (CALC): 136 mg/dL — AB
NON-HDL CHOLESTEROL (CALC): 157 mg/dL — AB (ref <130)
TRIGLYCERIDES: 100 mg/dL (ref <150)
Total CHOL/HDL Ratio: 4 (calc) (ref <5.0)

## 2018-05-14 LAB — HEMOGLOBIN A1C
Hgb A1c MFr Bld: 5.6 % of total Hgb (ref <5.7)
Mean Plasma Glucose: 114 (calc)
eAG (mmol/L): 6.3 (calc)

## 2018-05-15 ENCOUNTER — Other Ambulatory Visit: Payer: Self-pay | Admitting: *Deleted

## 2018-05-15 MED ORDER — METFORMIN HCL 500 MG PO TABS: 500.0000 mg | ORAL_TABLET | Freq: Every day | ORAL | 3 refills | Status: DC

## 2018-05-16 ENCOUNTER — Encounter: Payer: Self-pay | Admitting: *Deleted

## 2018-05-19 ENCOUNTER — Telehealth: Payer: Self-pay | Admitting: *Deleted

## 2018-05-19 NOTE — Telephone Encounter (Signed)
Patient advised that we were unable to fill out the [facility] paperwork. Patient will need to contact PCP. Patient verbalized understanding.

## 2018-05-22 NOTE — Progress Notes (Signed)
Office Visit Note  Patient: Kelly [name]             Date of Birth: 1961/05/17           MRN: [id number]             PCP: Alycia Rossetti, MD Referring: Alycia Rossetti, MD Visit Date: 06/05/2018 Occupation: _0 @  Subjective:  Right wrist pain   History of Present Illness: Kelly [name] is a 57 y.o. female with history of seropositive rheumatoid arthritis and osteoarthritis. She is taking PLQ 200 mg BID. She has not missed any doses.  She is tolerating it well. She has been taking natural anti-inflammatories, which she feels are helping.  She does not want to make any changes at this time.  She states she has intermittent sharp pains in the right wrist and right hand.  She is having pain in the left knee.  She saw Dr. Durward Fortes on 04/24/18 and had a left knee XR.  She declined a cortisone injection at that visit.  She states she continues to have joint stiffness for about 5 minutes in the morning.  She denies any other joint pain or joint swelling.     Activities of Daily Living:  Patient reports morning stiffness for 5 minutes.   Patient Reports nocturnal pain.  Difficulty dressing/grooming: Denies Difficulty climbing stairs: Denies Difficulty getting out of chair: Denies Difficulty using hands for taps, buttons, cutlery, and/or writing: Reports  Review of Systems  Constitutional: Negative for fatigue.  HENT: Positive for mouth dryness. Negative for mouth sores and nose dryness.   Eyes: Positive for itching and dryness. Negative for pain and visual disturbance.  Respiratory: Negative for cough, hemoptysis, shortness of breath and difficulty breathing.   Cardiovascular: Negative for chest pain, palpitations, hypertension and swelling in legs/feet.  Gastrointestinal: Positive for constipation and heartburn. Negative for blood in stool and diarrhea.  Endocrine: Negative for increased urination.  Genitourinary: Negative for difficulty urinating and painful urination.    Musculoskeletal: Positive for arthralgias, joint pain, joint swelling, morning stiffness and muscle tenderness. Negative for myalgias, muscle weakness and myalgias.  Skin: Negative for color change, pallor, rash, hair loss, nodules/bumps, skin tightness, ulcers and sensitivity to sunlight.  Allergic/Immunologic: Negative for susceptible to infections.  Neurological: Negative for dizziness, numbness and weakness.  Hematological: Negative for bruising/bleeding tendency and swollen glands.  Psychiatric/Behavioral: Negative for depressed mood and sleep disturbance. The patient is not nervous/anxious.     PMFS History:  Patient Active Problem List   Diagnosis Date Noted  . GAD (generalized anxiety disorder) 02/19/2018  . Psoriasis 01/13/2018  . Rheumatoid arthritis involving multiple sites with positive rheumatoid factor ([place]) 11/22/2017  . Diabetes mellitus, type II ([place]) 11/04/2017  . Bronchial stenosis   . Essential hypertension 11/29/2015  . 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 Hemorrhoids, internal 03/04/2013  . External hemorrhoids with pain 03/04/2013  . Depression   . Seasonal allergies   . Irritable bowel syndrome with constipation >> diarrhea   . Headache disorder   . GERD (gastroesophageal reflux disease) 01/28/2013  . Chest pain 08/08/2012  . Hyperlipidemia 08/08/2012  . Obesity (BMI 30-39.9) 08/08/2012    Past Medical History:  Diagnosis Date  . Allergy   . Anxiety   . Depression   . Diabetes mellitus without complication ([place])   . GERD (gastroesophageal reflux disease)   . Headache disorder   . Hyperlipidemia   . Hypertension   . IBS (irritable bowel syndrome)   .  Obesity   . Rheumatoid arthritis ([place])   . Salivary calculus   . Seasonal allergies     Family History  Problem Relation Age of Onset  . Heart attack Father        MI at age 33  . Coronary artery disease Brother   . Diabetes Brother   . Coronary artery disease Brother   . Heart  disease Brother   . Lung cancer Mother 34       +TOBACCO, had quit many years before diagnosis  . COPD Brother        ?Melanoma  . Lung cancer Unknown   . Hypertension Unknown    Past Surgical History:  Procedure Laterality Date  . ABDOMINAL HYSTERECTOMY  2001   partial hysterectomy  . REDUCTION MAMMAPLASTY Bilateral 04/25/2015  . SALIVARY GLAND SURGERY    . TEMPOROMANDIBULAR JOINT SURGERY     3 times   Social History   Social History Narrative   Lives with her fiance. He has children.   Raised a niece after her brother died, she is now adult.    Objective: Vital Signs: BP 131/79 (BP Location: Left Arm, Patient Position: Sitting, Cuff Size: Normal)   Pulse 80   Resp 16   Ht 5' 7" (1.702 m)   Wt 256 lb (116.1 kg)   BMI 40.10 kg/m    Physical Exam  Constitutional: She is oriented to person, place, and time. She appears well-developed and well-nourished.  HENT:  Head: Normocephalic and atraumatic.  No parotid swelling.  Eyes: Conjunctivae and EOM are normal.  Neck: Normal range of motion.  Cardiovascular: Normal rate, regular rhythm, normal heart sounds and intact distal pulses.  Pulmonary/Chest: Effort normal and breath sounds normal.  Abdominal: Soft. Bowel sounds are normal.  Lymphadenopathy:    She has no cervical adenopathy.  Neurological: She is alert and oriented to person, place, and time.  Skin: Skin is warm and dry. Capillary refill takes less than 2 seconds.  Psychiatric: She has a normal mood and affect. Her behavior is normal.  Nursing note and vitals reviewed.    Musculoskeletal Exam: C-spine, thoracic spine, and lumbar spine good ROM.  No midline spinal tenderness.  No SI joint tenderness.  Shoulder joints, elbow joints, wrist joints, MCPs,PIPs, and DIPs good ROM with no .  Complete fist formation bilaterally.  Right 2nd PIP synovial thickening.  Hip joints, knee joints, ankle joints, MTPs, PIPs, and DIPs good ROM with no synovitis.  No warmth or  effusion of knee joints. No tenderness of trochanteric bursa bilaterally.   CDAI Exam: CDAI Homunculus Exam:   Tenderness:  Right hand: 2nd PIP  Joint Counts:  CDAI Tender Joint count: 1 CDAI Swollen Joint count: 0  Global Assessments:  Patient Global Assessment: 5 Provider Global Assessment: 5  CDAI Calculated Score: 11   Investigation: No additional findings.  Imaging: No results found.  Recent Labs: Lab Results  Component Value Date   WBC 6.4 02/19/2018   HGB 13.1 02/19/2018   PLT 353 02/19/2018   NA 140 05/13/2018   K 4.1 05/13/2018   CL 103 05/13/2018   CO2 28 05/13/2018   GLUCOSE 106 (H) 05/13/2018   BUN 13 05/13/2018   CREATININE 0.77 05/13/2018   BILITOT 0.4 05/13/2018   ALKPHOS 91 02/10/2015   AST 15 05/13/2018   ALT 17 05/13/2018   PROT 6.6 05/13/2018   ALBUMIN 4.1 02/10/2015   CALCIUM 9.5 05/13/2018   GFRAA 115 12/24/2017   QFTBGOLDPLUS NEGATIVE  11/15/2017    Speciality Comments: PLQ Eye Exam: 12/24/17 WNL @ [facility] Follow up in 1 year  Procedures:  No procedures performed Allergies: Codeine; Topamax [topiramate]; and Maxalt [rizatriptan]   Assessment / Plan:     Visit Diagnoses: Rheumatoid arthritis involving multiple sites with positive rheumatoid factor (HCC) - RF+, CCP+, elevated ESR: She has no synovitis on exam.  She has synovial thickening of the right 2nd PIP joint. She has not had any recent rheumatoid arthritis flares. She has intermittent sharp pains in the right wrist and right hand, especially if she is typing for a prolonged period of time.  She continues to take Plaquenil 200 mg BID and natural anti-inflammatories.  She does not want to make any medication changes at this time.  She will continue on PLQ 200 mg BID.  A refill was sent to the pharmacy.    High risk medication use -  PLQ 200 mg BID. She was hesitant to start MTX and SSZ. CBC and CMP will be drawn at her next visit.    Primary osteoarthritis of both hands:  Complete fist formation.  Joint protection and muscle strengthening were discussed.   Unilateral primary osteoarthritis, right hip: She has good ROM on exam.  She has no discomfort at this time.   Primary osteoarthritis of both feet: She has mild osteoarthritic changes in bilateral feet.  She wears proper fitting shoes and has no discomfort at this time.   Other medical conditions are listed as follows:   History of hypertension  History of gastroesophageal reflux (GERD)  History of diabetes mellitus  History of IBS  History of hyperlipidemia  History of obesity  History of renal cell carcinoma   Orders: No orders of the defined types were placed in this encounter.  Meds ordered this encounter  Medications  . hydroxychloroquine (PLAQUENIL) 200 MG tablet    Sig: TAKE 1 TABLET BY MOUTH TWICE DAILY    Dispense:  180 tablet    Refill:  0     Follow-Up Instructions: Return in about 5 months (around 11/05/2018) for Rheumatoid arthritis, Osteoarthritis.   Ofilia Neas, PA-C   I examined and evaluated the patient with Hazel Sams PA.Patient had no synovitis on my exam today. The plan of care was discussed as noted above.  Bo Merino, MD  Note - This record has been created using Editor, commissioning.  Chart creation errors have been sought, but may not always  have been located. Such creation errors do not reflect on  the standard of medical care.

## 2018-05-26 ENCOUNTER — Other Ambulatory Visit: Payer: Self-pay | Admitting: Rheumatology

## 2018-05-26 ENCOUNTER — Encounter: Payer: Self-pay | Admitting: Family Medicine

## 2018-05-26 ENCOUNTER — Telehealth: Payer: Self-pay | Admitting: Family Medicine

## 2018-05-26 MED FILL — HYDROXYCHLOROQUINE SULFATE: 200 | 30 days supply | Qty: 60 | Fill #0

## 2018-05-26 NOTE — Telephone Encounter (Signed)
Last Visit: 03/05/18 Next Visit: 06/05/18 Labs: 02/19/18 cbc/cmp wnl PLQ Eye Exam: 12/24/17 WNL   Okay to refill per Dr. Estanislado Pandy

## 2018-05-26 NOTE — Telephone Encounter (Signed)
Received fmla forms for this patient  Will route to christina

## 2018-05-26 NOTE — Telephone Encounter (Signed)
Received FMLA forms from [employer].   Job title: [employer] Hours of Work: 40+ hours, [employer]/ [employer]  Reason FMLA requested: RA (M05.79),  joint pain/ swelling  Requested Beginning Date: intermittent  Per MD, FMLA discussed with Dr. Estanislado Pandy. PCP will complete forms with recommendation of 4 days/ month for flares. This will be in effect x6 months and then will be re-evaluated. Simple form fee to be charged.   Forms completed within guidelines and routed to provider.

## 2018-05-26 NOTE — Telephone Encounter (Signed)
-----   Message from Alycia Rossetti, MD sent at 05/26/2018  1:42 PM EDT ----- Regarding: FW: FMLA Call pt, I discussed with Dr. Estanislado Pandy. I will complete forms based on her recommendation, she will get 4 days a month for flares. This will be in effect for 6 months, then needs to be re-evaluated No reduced hours.  She can fax forms to our office, there will be $20 charge  ----- Message ----- From: Bo Merino, MD Sent: 05/26/2018   1:08 PM To: Alycia Rossetti, MD Subject: RE: Katherina Right                                       She has appointment coming up in August. Based on the last visit, I would suggest that 4 days a month for RA flare would be sufficient. The goal is that she should not have flares in future.  Bo Merino MD ----- Message ----- From: Alycia Rossetti, MD Sent: 05/26/2018  11:42 AM To: Bo Merino, MD Subject: FMLA                                             Mrs. Brethauer has requested FMLA as was told by your staff you don't complete ( I am not sure if this is true or not). She has Rheumatoid Arthritis, which I am not treating hence I deferred her FMLA to you, to determine if this is something needed for her "flares".   If you do not complete these forms, I would appreciate some guidance on how many days you expect her to be out of work for flares and if OV would be needed during this time or if she would need to reduce her work to part-time.   Thank you.     Kevan Rosebush

## 2018-05-28 ENCOUNTER — Telehealth: Payer: Self-pay | Admitting: Family Medicine

## 2018-05-28 DIAGNOSIS — Z131 Encounter for screening for diabetes mellitus: Secondary | ICD-10-CM | POA: Diagnosis not present

## 2018-05-28 DIAGNOSIS — Z13820 Encounter for screening for osteoporosis: Secondary | ICD-10-CM | POA: Diagnosis not present

## 2018-05-28 LAB — HM MAMMOGRAPHY

## 2018-05-28 LAB — HM DEXA SCAN

## 2018-05-28 NOTE — Telephone Encounter (Signed)
Received fmla paperwork for this patient  Will route to christina

## 2018-05-29 NOTE — Telephone Encounter (Signed)
Error noted in date section of form. Corrected and sent to MD for signature.

## 2018-06-03 ENCOUNTER — Encounter: Payer: Self-pay | Admitting: *Deleted

## 2018-06-03 ENCOUNTER — Encounter: Payer: Self-pay | Admitting: Family Medicine

## 2018-06-05 ENCOUNTER — Encounter: Payer: Self-pay | Admitting: Physician Assistant

## 2018-06-05 ENCOUNTER — Ambulatory Visit: Payer: 59 | Admitting: Rheumatology

## 2018-06-05 VITALS — BP 131/79 | HR 80 | Resp 16 | Ht 67.0 in | Wt 256.0 lb

## 2018-06-05 DIAGNOSIS — Z8719 Personal history of other diseases of the digestive system: Secondary | ICD-10-CM

## 2018-06-05 DIAGNOSIS — Z79899 Other long term (current) drug therapy: Secondary | ICD-10-CM

## 2018-06-05 DIAGNOSIS — M1611 Unilateral primary osteoarthritis, right hip: Secondary | ICD-10-CM

## 2018-06-05 DIAGNOSIS — Z85528 Personal history of other malignant neoplasm of kidney: Secondary | ICD-10-CM | POA: Diagnosis not present

## 2018-06-05 DIAGNOSIS — M19041 Primary osteoarthritis, right hand: Secondary | ICD-10-CM

## 2018-06-05 DIAGNOSIS — M19071 Primary osteoarthritis, right ankle and foot: Secondary | ICD-10-CM

## 2018-06-05 DIAGNOSIS — Z8639 Personal history of other endocrine, nutritional and metabolic disease: Secondary | ICD-10-CM | POA: Diagnosis not present

## 2018-06-05 DIAGNOSIS — M19042 Primary osteoarthritis, left hand: Secondary | ICD-10-CM

## 2018-06-05 DIAGNOSIS — M0579 Rheumatoid arthritis with rheumatoid factor of multiple sites without organ or systems involvement: Secondary | ICD-10-CM | POA: Diagnosis not present

## 2018-06-05 DIAGNOSIS — Z8679 Personal history of other diseases of the circulatory system: Secondary | ICD-10-CM

## 2018-06-05 DIAGNOSIS — M19072 Primary osteoarthritis, left ankle and foot: Secondary | ICD-10-CM

## 2018-06-05 MED ORDER — HYDROXYCHLOROQUINE SULFATE 200 MG PO TABS
ORAL_TABLET | ORAL | 0 refills | Status: DC
Start: 2018-06-05 — End: 2018-09-25

## 2018-06-17 ENCOUNTER — Ambulatory Visit: Payer: 59 | Admitting: Gastroenterology

## 2018-06-17 ENCOUNTER — Encounter

## 2018-06-17 ENCOUNTER — Encounter: Payer: Self-pay | Admitting: Gastroenterology

## 2018-06-17 VITALS — BP 155/96 | HR 91 | Temp 97.2°F | Ht 67.0 in | Wt 251.6 lb

## 2018-06-17 DIAGNOSIS — R195 Other fecal abnormalities: Secondary | ICD-10-CM | POA: Diagnosis not present

## 2018-06-17 DIAGNOSIS — K625 Hemorrhage of anus and rectum: Secondary | ICD-10-CM | POA: Diagnosis not present

## 2018-06-17 DIAGNOSIS — K648 Other hemorrhoids: Secondary | ICD-10-CM

## 2018-06-17 NOTE — Assessment & Plan Note (Signed)
Discussed with patient at length, standard of care would be to pursue colonoscopy at this point.  She is reluctant to pursue colonoscopy due to her fears of sedation.  She is aware that positive Cologuard testing could indicate either blood detected in the stool, advanced precancerous polyps detected, or she could even have colon cancer.  We cannot tell her why her test is positive without colonoscopy.  If she declines colonoscopy, she is aware that precancerous polyps could turn into cancer.  She is also aware that delaying colonoscopy or diagnosis of a possible colon cancer can result in untreatable or uncurable cancer.  She is adamant about pursuing hemorrhoid banding prior to consideration of colonoscopy.

## 2018-06-17 NOTE — Assessment & Plan Note (Signed)
On exam, grade 3  internal hemorrhoid noted. Patient is interested in [place] banding.  We discussed the pros and cons of hemorrhoid banding.  We discussed treatment options for hemorrhoids including topical regimens, banding, referral for surgical excision.  We discussed realistic expectations from hemorrhoid banding and the fact that she could still have hemorrhoid tag left after hemorrhoid treated.  Currently having adequate bowel function, advised to maintain regular soft BMs.  Avoid prolonged toilet time.

## 2018-06-17 NOTE — Patient Instructions (Addendum)
1. Return for hemorrhoid banding with Dr. Gala Romney.  2. As discussed today, you have a POSITIVE Cologuard. This could be because you had blood in the stool, you may have an advanced polyp (precancerous polyp), or even colon cancer. If you have a precancerous polyp, you can develop colon cancer in the future if not removed. If you have colon cancer, delaying diagnosis can lead to untreatable/uncurable cancer. The standard of care is to pursue a colonoscopy. You have declined this today but at any time you are ready to pursue a colonoscopy, please let us know.

## 2018-06-17 NOTE — Progress Notes (Signed)
Primary Care Physician:  Alycia Rossetti, MD  Primary Gastroenterologist:  Garfield Cornea, MD    Chief Complaint  Patient presents with  . positive cologuard    never had tcs  . Hemorrhoids    bleeding; states she has IBS    HPI:  Kelly [name] is a 57 y.o. female here at the request of Dr. Buelah Manis for further evaluation of positive Cologuard test.  Patient has been reluctant to pursuing colonoscopy because she is afraid of sedation.  She has never had a colonoscopy.  Back in April she completed Cologuard test which was positive.  She has a long history of issues with fissure, hemorrhoids and believes her test was positive because of blood coming from her hemorrhoid.    She states she was diagnosed with anorectal fissure years ago.  She really has not had any problems with this in 6 to 8 months.  She does have a hemorrhoid that stays out all the time.  She can reduce it but it prolapses back out immediately.  She has a bowel movement every day.  Stool is soft.  Sometimes she uses stool softener MiraLAX if she needs it.  Bright red blood per rectum at times which she feels is from her hemorrhoids.  Denies abdominal pain.  Reflux well controlled on Nexium.  Takes Xanax on occasion, sometimes not even weekly.  Current Outpatient Medications  Medication Sig Dispense Refill  . ACCU-CHEK FASTCLIX LANCETS MISC USE TO MONITOR BLOOD SUGAR 3 TIMES DAILY 306 each 1  . ACCU-CHEK FASTCLIX LANCETS MISC USE TO MONITOR BLOOD SUGAR 3 TIMES DAILY 306 each 1  . ALPRAZolam (XANAX) 0.25 MG tablet TAKE 1 TABLET BY MOUTH TWICE A DAY AS NEEDED FOR ANXIETY 45 tablet 0  . aspirin 81 MG tablet Take 81 mg by mouth daily.    . Cyanocobalamin (VITAMIN B-12) 2500 MCG SUBL 1 tablet daily  0  . esomeprazole (NEXIUM) 40 MG capsule Take 1 capsule (40 mg total) by mouth daily. 90 capsule 3  . Fluocinolone Acetonide Scalp 0.01 % OIL APPLY 1ML AS DIRECTED TOPICALLY TO THE AFFECTED AREAS DAILY AS NEEDED FOR FLARE UPS  6  .  fluticasone (FLONASE) 50 MCG/ACT nasal spray USE 2 SPRAYS IN EACH       NOSTRIL DAILY 48 g 3  . Glucose Blood (BLOOD GLUCOSE TEST STRIPS) STRP Please dispense based on patient and insurance preference. Use as directed to monitor FSBS 3x daily. Dx: E11.65. 300 each 1  . hydroxychloroquine (PLAQUENIL) 200 MG tablet TAKE 1 TABLET BY MOUTH TWICE DAILY 180 tablet 0  . ibuprofen (ADVIL,MOTRIN) 800 MG tablet Take 800 mg by mouth as needed.    Marland Kitchen losartan-hydrochlorothiazide (HYZAAR) 50-12.5 MG tablet Take 0.5 tablets by mouth daily. 45 tablet 3  . Menthol, Topical Analgesic, (BIOFREEZE EX) Apply topically as needed.    . Menthol-Methyl Salicylate (ICY HOT BALM EXTRA STRENGTH EX) Apply 1 application topically as needed (for arm and shoulder pain).    . metFORMIN (GLUCOPHAGE) 500 MG tablet Take 1 tablet (500 mg total) by mouth daily with breakfast. 180 tablet 3  . Omega-3 Fatty Acids (FISH OIL) 1200 MG CAPS Take 1 capsule by mouth 3 (three) times a week.    . TURMERIC PO Take by mouth daily.     No current facility-administered medications for this visit.     Allergies as of 06/17/2018 - Review Complete 06/17/2018  Allergen Reaction Noted  . Codeine Nausea Only 08/08/2012  . Topamax [topiramate]  Nausea Only 08/08/2012  . Maxalt [rizatriptan] Nausea Only 02/18/2013    Past Medical History:  Diagnosis Date  . Allergy   . Anxiety   . Depression   . Diabetes mellitus without complication ([place])   . GERD (gastroesophageal reflux disease)   . Headache disorder   . Hyperlipidemia   . Hypertension   . IBS (irritable bowel syndrome)   . Obesity   . Rheumatoid arthritis ([place])   . Salivary calculus   . Seasonal allergies     Past Surgical History:  Procedure Laterality Date  . ABDOMINAL HYSTERECTOMY  2001   partial hysterectomy  . REDUCTION MAMMAPLASTY Bilateral 04/25/2015  . SALIVARY GLAND SURGERY    . TEMPOROMANDIBULAR JOINT SURGERY     3 times    Family History  Problem Relation Age of  Onset  . Heart attack Father        MI at age 70  . Coronary artery disease Brother   . Diabetes Brother   . Coronary artery disease Brother   . Heart disease Brother   . Lung cancer Mother 72       +TOBACCO, had quit many years before diagnosis  . COPD Brother        ?Melanoma  . Lung cancer Unknown   . Hypertension Unknown   . Colon cancer Neg Hx     Social History   Socioeconomic History  . Marital status: Single    Spouse name: n/a  . Number of children: 0  . Years of education: Associates  . Highest education level: Not on file  Occupational History  . Occupation: [employer]    Comment: [place], [employer]  . Occupation: Forensic psychologist    Comment: Day Surgery  Social Needs  . Financial resource strain: Not on file  . Food insecurity:    Worry: Not on file    Inability: Not on file  . Transportation needs:    Medical: Not on file    Non-medical: Not on file  Tobacco Use  . Smoking status: Never Smoker  . Smokeless tobacco: Never Used  Substance and Sexual Activity  . Alcohol use: No    Alcohol/week: 0.0 standard drinks    Frequency: Never  . Drug use: No  . Sexual activity: Not on file  Lifestyle  . Physical activity:    Days per week: Not on file    Minutes per session: Not on file  . Stress: Not on file  Relationships  . Social connections:    Talks on phone: Not on file    Gets together: Not on file    Attends religious service: Not on file    Active member of club or organization: Not on file    Attends meetings of clubs or organizations: Not on file    Relationship status: Not on file  . Intimate partner violence:    Fear of current or ex partner: Not on file    Emotionally abused: Not on file    Physically abused: Not on file    Forced sexual activity: Not on file  Other Topics Concern  . Not on file  Social History Narrative   Lives with her fiance. He has children.   Raised a niece after her brother died, she is now adult.       ROS:  General: Negative for anorexia, weight loss, fever, chills, positive fatigue, weakness. Eyes: Negative for vision changes.  ENT: Negative for hoarseness, difficulty swallowing , nasal congestion. CV: Negative  for chest pain, angina, palpitations, dyspnea on exertion, peripheral edema.  Respiratory: Negative for dyspnea at rest, dyspnea on exertion, cough, sputum, wheezing.  GI: See history of present illness. GU:  Negative for dysuria, hematuria, urinary incontinence, urinary frequency, nocturnal urination.  MS: Positive joint pain, no low back pain. Derm: Negative for rash or itching.  Neuro: Negative for weakness, abnormal sensation, seizure, frequent headaches, memory loss, confusion.  Psych: Negative for anxiety, depression, suicidal ideation, hallucinations.  Endo: Negative for unusual weight change.  Heme: Negative for bruising or bleeding. Allergy: Negative for rash or hives.    Physical Examination:  BP (!) 155/96   Pulse 91   Temp (!) 97.2 F (36.2 C) (Oral)   Ht 5\' 7"  (1.702 m)   Wt 251 lb 9.6 oz (114.1 kg)   BMI 39.41 kg/m    General: Well-nourished, well-developed in no acute distress.  Head: Normocephalic, atraumatic.   Eyes: Conjunctiva pink, no icterus. Mouth: Oropharyngeal mucosa moist and pink , no lesions erythema or exudate. Neck: Supple without thyromegaly, masses, or lymphadenopathy.  Lungs: Clear to auscultation bilaterally.  Heart: Regular rate and rhythm, no murmurs rubs or gallops.  Abdomen: Bowel sounds are normal, nontender, nondistended, no hepatosplenomegaly or masses, no abdominal bruits or    hernia , no rebound or guarding.   Rectal: Hemorrhoid noted on external exam.  No evidence of bleeding or excoriation.  Easily reducible but quickly prolapses back out.  No masses in the rectal vault.  Stool is brown.  Hemoccult not performed as she is already been confirmed to have positive Cologuard. Extremities: No lower extremity edema. No clubbing  or deformities.  Neuro: Alert and oriented x 4 , grossly normal neurologically.  Skin: Warm and dry, no rash or jaundice.   Psych: Alert and cooperative, normal mood and affect.  Labs: Lab Results  Component Value Date   HGBA1C 5.6 05/13/2018   Lab Results  Component Value Date   CREATININE 0.77 05/13/2018   BUN 13 05/13/2018   NA 140 05/13/2018   K 4.1 05/13/2018   CL 103 05/13/2018   CO2 28 05/13/2018   Lab Results  Component Value Date   ALT 17 05/13/2018   AST 15 05/13/2018   ALKPHOS 91 02/10/2015   BILITOT 0.4 05/13/2018   Lab Results  Component Value Date   WBC 6.4 02/19/2018   HGB 13.1 02/19/2018   HCT 38.3 02/19/2018   MCV 80.5 02/19/2018   PLT 353 02/19/2018     Imaging Studies: No results found.

## 2018-06-20 ENCOUNTER — Ambulatory Visit: Payer: 59 | Admitting: Gastroenterology

## 2018-06-20 MED FILL — HYDROXYCHLOROQUINE SULFATE: 200 | 90 days supply | Qty: 180 | Fill #0

## 2018-06-20 MED FILL — FLUOCINOLONE ACETONIDE SCAL: 0.01 | 30 days supply | Qty: 118 | Fill #2

## 2018-06-26 ENCOUNTER — Other Ambulatory Visit: Payer: Self-pay | Admitting: Family Medicine

## 2018-06-26 MED ORDER — METFORMIN HCL 500 MG PO TABS: ORAL_TABLET | ORAL | 3 refills | Status: DC

## 2018-06-26 MED FILL — ACCU-CHEK FASTCLIX LANCETS: 90 days supply | Qty: 306 | Fill #1

## 2018-06-26 MED FILL — metFORMIN HCL 500 MG TABS: 500 | 45 days supply | Qty: 180 | Fill #0

## 2018-06-26 MED FILL — ACCU-CHEK GUIDE STRP: 90 days supply | Qty: 300 | Fill #1

## 2018-06-26 NOTE — Addendum Note (Signed)
Addended by: Shary Decamp B on: 06/26/2018 09:21 AM   Modules accepted: Orders

## 2018-07-09 MED FILL — IBUPROFEN 800 MG TAB: 800 | 90 days supply | Qty: 180 | Fill #3

## 2018-07-09 MED FILL — ESOMEPRAZOLE MAG DR 40 MG C: 40 | 90 days supply | Qty: 90 | Fill #3

## 2018-07-24 ENCOUNTER — Encounter: Payer: Self-pay | Admitting: Podiatry

## 2018-07-24 ENCOUNTER — Ambulatory Visit (INDEPENDENT_AMBULATORY_CARE_PROVIDER_SITE_OTHER): Payer: 59

## 2018-07-24 ENCOUNTER — Ambulatory Visit: Payer: 59 | Admitting: Podiatry

## 2018-07-24 VITALS — BP 125/82 | HR 86

## 2018-07-24 DIAGNOSIS — M21619 Bunion of unspecified foot: Secondary | ICD-10-CM

## 2018-07-24 DIAGNOSIS — M722 Plantar fascial fibromatosis: Secondary | ICD-10-CM | POA: Diagnosis not present

## 2018-07-24 NOTE — Patient Instructions (Signed)
Pre-Operative Instructions  Congratulations, you have decided to take an important step towards improving your quality of life.  You can be assured that the doctors and staff at [facility] will be with you every step of the way.  Here are some important things you should know:  1. Plan to be at the surgery center/hospital at least 1 (one) hour prior to your scheduled time, unless otherwise directed by the surgical center/hospital staff.  You must have a responsible adult accompany you, remain during the surgery and drive you home.  Make sure you have directions to the surgical center/hospital to ensure you arrive on time. 2. If you are having surgery at [facility] or [facility], you will need a copy of your medical history and physical form from your family physician within one month prior to the date of surgery. We will give you a form for your primary physician to complete.  3. We make every effort to accommodate the date you request for surgery.  However, there are times where surgery dates or times have to be moved.  We will contact you as soon as possible if a change in schedule is required.   4. No aspirin/ibuprofen for one week before surgery.  If you are on aspirin, any non-steroidal anti-inflammatory medications (Mobic, Aleve, Ibuprofen) should not be taken seven (7) days prior to your surgery.  You make take Tylenol for pain prior to surgery.  5. Medications - If you are taking daily heart and blood pressure medications, seizure, reflux, allergy, asthma, anxiety, pain or diabetes medications, make sure you notify the surgery center/hospital before the day of surgery so they can tell you which medications you should take or avoid the day of surgery. 6. No food or drink after midnight the night before surgery unless directed otherwise by surgical center/hospital staff. 7. No alcoholic beverages 24-hours prior to surgery.  No smoking 24-hours prior or 24-hours after  surgery. 8. Wear loose pants or shorts. They should be loose enough to fit over bandages, boots, and casts. 9. Don't wear slip-on shoes. Sneakers are preferred. 10. Bring your boot with you to the surgery center/hospital.  Also bring crutches or a walker if your physician has prescribed it for you.  If you do not have this equipment, it will be provided for you after surgery. 11. If you have not been contacted by the surgery center/hospital by the day before your surgery, call to confirm the date and time of your surgery. 12. Leave-time from work may vary depending on the type of surgery you have.  Appropriate arrangements should be made prior to surgery with your employer. 13. Prescriptions will be provided immediately following surgery by your doctor.  Fill these as soon as possible after surgery and take the medication as directed. Pain medications will not be refilled on weekends and must be approved by the doctor. 14. Remove nail polish on the operative foot and avoid getting pedicures prior to surgery. 15. Wash the night before surgery.  The night before surgery wash the foot and leg well with water and the antibacterial soap provided. Be sure to pay special attention to beneath the toenails and in between the toes.  Wash for at least three (3) minutes. Rinse thoroughly with water and dry well with a towel.  Perform this wash unless told not to do so by your physician.  Enclosed: 1 Ice pack (please put in freezer the night before surgery)   1 Hibiclens skin cleaner     Pre-op instructions  If you have any questions regarding the instructions, please do not hesitate to call our office.  [place]: [address], [place], [place] [address] -- [phone]  [place]: [address] [place], [place] [address] -- [phone]  [place]: [address]  [place], [place] [address] -- [phone]  [place]: [address], [address], [place], [place] [address] -- [phone]  Website:  [email or web address] 

## 2018-07-25 ENCOUNTER — Other Ambulatory Visit: Payer: Self-pay | Admitting: Family Medicine

## 2018-07-25 MED FILL — ALPRAZolam 0.25 MG TABS: 0.25 | 22 days supply | Qty: 45 | Fill #0

## 2018-07-25 NOTE — Telephone Encounter (Signed)
Ok to refill??  Last office visit 05/13/2018.  Last refill 04/22/2018.

## 2018-07-28 DIAGNOSIS — M21619 Bunion of unspecified foot: Secondary | ICD-10-CM | POA: Insufficient documentation

## 2018-07-28 NOTE — Progress Notes (Addendum)
Subjective:   Patient ID: Star Age, female   DOB: 57 y.o.   MRN: [id number]   HPI 57 year old female presents the office today for concerns of bilateral bunion pain in the left side worse than the right.  She states is been getting worse over the last 6 months and she is tried changing shoes and offloading the area that any significant improvement.  She says that area is painful on a daily basis most with pressure in shoes.  She has no other concerns.   Review of Systems  All other systems reviewed and are negative.  Past Medical History:  Diagnosis Date  . Allergy   . Anxiety   . Depression   . Diabetes mellitus without complication ([place])   . GERD (gastroesophageal reflux disease)   . Headache disorder   . Hyperlipidemia   . Hypertension   . IBS (irritable bowel syndrome)   . Obesity   . Rheumatoid arthritis ([place])   . Salivary calculus   . Seasonal allergies     Past Surgical History:  Procedure Laterality Date  . ABDOMINAL HYSTERECTOMY  2001   partial hysterectomy  . REDUCTION MAMMAPLASTY Bilateral 04/25/2015  . SALIVARY GLAND SURGERY    . TEMPOROMANDIBULAR JOINT SURGERY     3 times     Current Outpatient Medications:  .  ACCU-CHEK FASTCLIX LANCETS MISC, USE TO MONITOR BLOOD SUGAR 3 TIMES DAILY, Disp: 306 each, Rfl: 1 .  ACCU-CHEK FASTCLIX LANCETS MISC, USE TO MONITOR BLOOD SUGAR 3 TIMES DAILY, Disp: 306 each, Rfl: 1 .  ALPRAZolam (XANAX) 0.25 MG tablet, TAKE 1 TABLET BY MOUTH TWICE A DAY AS NEEDED FOR ANXIETY, Disp: 45 tablet, Rfl: 0 .  aspirin 81 MG tablet, Take 81 mg by mouth daily., Disp: , Rfl:  .  Cyanocobalamin (VITAMIN B-12) 2500 MCG SUBL, 1 tablet daily, Disp: , Rfl: 0 .  esomeprazole (NEXIUM) 40 MG capsule, Take 1 capsule (40 mg total) by mouth daily., Disp: 90 capsule, Rfl: 3 .  Fluocinolone Acetonide Scalp 0.01 % OIL, APPLY 1ML AS DIRECTED TOPICALLY TO THE AFFECTED AREAS DAILY AS NEEDED FOR FLARE UPS, Disp: , Rfl: 6 .  fluticasone (FLONASE) 50 MCG/ACT  nasal spray, USE 2 SPRAYS IN EACH       NOSTRIL DAILY, Disp: 48 g, Rfl: 3 .  Glucose Blood (BLOOD GLUCOSE TEST STRIPS) STRP, Please dispense based on patient and insurance preference. Use as directed to monitor FSBS 3x daily. Dx: E11.65., Disp: 300 each, Rfl: 1 .  hydroxychloroquine (PLAQUENIL) 200 MG tablet, TAKE 1 TABLET BY MOUTH TWICE DAILY, Disp: 180 tablet, Rfl: 0 .  ibuprofen (ADVIL,MOTRIN) 800 MG tablet, Take 800 mg by mouth as needed., Disp: , Rfl:  .  losartan-hydrochlorothiazide (HYZAAR) 50-12.5 MG tablet, Take 0.5 tablets by mouth daily., Disp: 45 tablet, Rfl: 3 .  Menthol, Topical Analgesic, (BIOFREEZE EX), Apply topically as needed., Disp: , Rfl:  .  Menthol-Methyl Salicylate (ICY HOT BALM EXTRA STRENGTH EX), Apply 1 application topically as needed (for arm and shoulder pain)., Disp: , Rfl:  .  metFORMIN (GLUCOPHAGE) 500 MG tablet, Take 1 tablet (500 mg total) by mouth daily with breakfast., Disp: 180 tablet, Rfl: 3 .  metFORMIN (GLUCOPHAGE) 500 MG tablet, TAKE 1 TABLET BY MOUTH TWICE DAILY WITH MEALS FOR 7 DAYS, THEN INCREASE TO 2 TABLETS TWICE DAILY WITH MEALS, Disp: 180 tablet, Rfl: 3 .  Omega-3 Fatty Acids (FISH OIL) 1200 MG CAPS, Take 1 capsule by mouth 3 (three) times a week., Disp: ,  Rfl:  .  TURMERIC PO, Take by mouth daily., Disp: , Rfl:  .  ALPRAZolam (XANAX) 0.25 MG tablet, TAKE 1 TABLET BY MOUTH TWICE A DAY AS NEEDED FOR ANXIETY, Disp: 45 tablet, Rfl: 0  Allergies  Allergen Reactions  . Codeine Nausea Only  . Topamax [Topiramate] Nausea Only  . Maxalt [Rizatriptan] Nausea Only         Objective:  Physical Exam  General: AAO x3, NAD  Dermatological: Skin is warm, dry and supple bilateral. Nails x 10 are well manicured; remaining integument appears unremarkable at this time. There are no open sores, no preulcerative lesions, no rash or signs of infection present.  Vascular: Dorsalis Pedis artery and Posterior Tibial artery pedal pulses are 2/4 bilateral with  immedate capillary fill time.  There is no pain with calf compression, swelling, warmth, erythema.   Neruologic: Grossly intact via light touch bilateral. Protective threshold with Semmes Wienstein monofilament intact to all pedal sites bilateral.   Musculoskeletal: Moderate bunion deformities present bilaterally the left side worse than the right.  There is tenderness palpation along on the bunion site.  There is no pain or crepitation with MPJ range of motion.  There is no hypermobility of the first ray.  Tenderness or pain on the bunion site but no other areas of tenderness.  Muscular strength 5/5 in all groups tested bilateral.  Gait: Unassisted, Nonantalgic.       Assessment:   Symptomatic bunion deformity left side worse than right     Plan:  -Treatment options discussed including all alternatives, risks, and complications -Etiology of symptoms were discussed -X-rays were obtained and reviewed with the patient.  Moderate bunion deformities present bilaterally.  No evidence of acute fracture. -We discussed with conservative as well as surgical treatment options.  At this point she wishes to go and proceed with surgery as she is tried changing shoes and offloading to any significant improvement she is continued to have pain on daily basis.  Her A1c has been controlled.  Circulation appears to be adequate. -Discussed [place] bunionectomy with screw fixation -The incision placement as well as the postoperative course was discussed with the patient. I discussed risks of the surgery which include, but not limited to, infection, bleeding, pain, swelling, need for further surgery, delayed or nonhealing, painful or ugly scar, numbness or sensation changes, over/under correction, recurrence, transfer lesions, further deformity, hardware failure, DVT/PE, loss of toe/foot. Patient understands these risks and wishes to proceed with surgery. The surgical consent was reviewed with the patient all 3 pages  were signed. No promises or guarantees were given to the outcome of the procedure. All questions were answered to the best of my ability. Before the surgery the patient was encouraged to call the office if there is any further questions. The surgery will be performed at [facility] on an outpatient basis. -Surgery is planned for December 4.  She does see her primary care physician in November to have her A1c rechecked we will plan on doing surgery in December at her request.  I did give her paperwork for history and physical form that needs to completed by her PCP within 30 days of surgery.   Trula Slade DPM

## 2018-07-30 ENCOUNTER — Telehealth: Payer: Self-pay | Admitting: *Deleted

## 2018-07-30 NOTE — Telephone Encounter (Signed)
"  Hey Kelly [name], this is Carolan.  I'm calling to see if you got my surgery scheduled for the Day Surgery.  Give me a call back.  Thanks Kelly [name]."

## 2018-07-31 NOTE — Telephone Encounter (Signed)
I attempted to call Kelly [name].  I left her a message that I have her scheduled for surgery on December 4 at [facility] at 1pm.  I informed her that she will probably need to be there two hours ahead of time.

## 2018-08-11 MED FILL — LOSARTAN-HCTZ 50-12.5 MG TA: 50-12.5 | 90 days supply | Qty: 45 | Fill #3

## 2018-08-23 DIAGNOSIS — M069 Rheumatoid arthritis, unspecified: Secondary | ICD-10-CM | POA: Diagnosis not present

## 2018-08-23 DIAGNOSIS — S66811A Strain of other specified muscles, fascia and tendons at wrist and hand level, right hand, initial encounter: Secondary | ICD-10-CM | POA: Diagnosis not present

## 2018-08-23 DIAGNOSIS — S838X2A Sprain of other specified parts of left knee, initial encounter: Secondary | ICD-10-CM | POA: Diagnosis not present

## 2018-08-25 ENCOUNTER — Telehealth: Payer: Self-pay | Admitting: Internal Medicine

## 2018-08-25 ENCOUNTER — Telehealth: Payer: Self-pay | Admitting: *Deleted

## 2018-08-25 NOTE — Telephone Encounter (Signed)
Pt has a banding scheduled with RMR in the morning. Pt said her BP was 165/101 and wanted to know if she should still have the banding. Please advise. [phone]

## 2018-08-25 NOTE — Telephone Encounter (Signed)
Received call from patient.   Reports that BP readings have been trending high for the past few days. States that BP is staying around 160/100.  Reports that she was taking Losartan/ HCTZ 50-12.5mg  (1/2) tab PO QD. Reports that she has begun taking whole pill QD, but has not noted decrease in BP.   Of note, patient has hemorrhoid banding procedure scheduled for 08/26/2018.  MD please advise.

## 2018-08-25 NOTE — Telephone Encounter (Signed)
If she has been taking a whole pill daily and blood pressure is still elevated she could increase to 2 pills daily.  Allow 1 week to take effect

## 2018-08-25 NOTE — Telephone Encounter (Signed)
Spoke with pt, she said her BP has come down some since she called. She takes BP med Ahmed Prima is daily. Pt feels fine with no problem. She is going to see how she feels in the morning and recheck it. Pt plans to keep her appointment as of now.

## 2018-08-26 ENCOUNTER — Encounter: Payer: Self-pay | Admitting: *Deleted

## 2018-08-26 ENCOUNTER — Encounter: Payer: Self-pay | Admitting: Internal Medicine

## 2018-08-26 ENCOUNTER — Ambulatory Visit: Payer: 59 | Admitting: Internal Medicine

## 2018-08-26 ENCOUNTER — Telehealth: Payer: Self-pay | Admitting: *Deleted

## 2018-08-26 ENCOUNTER — Other Ambulatory Visit: Payer: Self-pay | Admitting: *Deleted

## 2018-08-26 VITALS — BP 155/102 | HR 100 | Temp 97.2°F | Ht 67.0 in | Wt 252.4 lb

## 2018-08-26 DIAGNOSIS — R195 Other fecal abnormalities: Secondary | ICD-10-CM

## 2018-08-26 DIAGNOSIS — K648 Other hemorrhoids: Secondary | ICD-10-CM | POA: Diagnosis not present

## 2018-08-26 MED ORDER — CLENPIQ 10-3.5-12 MG-GM -GM/160ML PO SOLN: 1.0000 | Freq: Once | ORAL | 0 refills | Status: AC

## 2018-08-26 MED FILL — CLENPIQ 10-3.5-12 MG-GM -GM: 10-3.5-12 M | 1 days supply | Qty: 320 | Fill #0

## 2018-08-26 NOTE — Progress Notes (Signed)
[place] banding procedure note:  Patient referred for positive Cologuard.  No prior colonoscopy.  Intermittent hemorrhoidal-like symptoms including bleeding for a couple of years now.  Colonoscopy recommended 2 months ago.  Patient declined.  Wanted her hemorrhoids banded firstly prior to considering colonoscopy.  Symptoms unchanged.  She is occasionally constipated  -  take stool softeners laxatives on an as needed basis no fiber.  I had a lengthy discussion regarding the patient that this is not in the order of evaluation recommended.  It is extremely important to evaluate her Cologuard.  Hemorrhoids can always be banded at some point.  Patient states she will come back for colonoscopy but wants her hemorrhoids banded first.  She is firm in this desire even though I explained that putting a colonoscopy off further could jeopardize her health.  She seems to understand the risks.  The patient presents with symptomatic grade 3 hemorrhoids, unresponsive to maximal medical therapy, requesting rubber band ligation of his/her hemorrhoidal disease. All risks, benefits, and alternative forms of therapy were described and informed consent was obtained.  In the left lateral decubitus position, DRE revealed a grade 3 hemorrhoid tag which was easily reduced.  Otherwise negative.  Anoscopy revealed prominent right and left hemorrhoid columns without other abnormality being seen.  The decision was made to band the right posterior internal hemorrhoid subsequently the left lateral and right anterior hemorrhoid columns with 1 band each.  Band verification was confirmed digitally after each band placement.  There was no pinching or pain after each band placement.  Patient did very well without any pinching or pain. The patient was discharged home without pain or other issues. Dietary and behavioral recommendations were given. Begin Benefiber 1 tablespoon daily for 3 weeks increase to 1 tablespoon twice daily.  Take Benefiber  every day without fail.  May take laxatives and stool softeners on an as-needed basis. No complications were encountered and the patient tolerated the procedure well.

## 2018-08-26 NOTE — Telephone Encounter (Signed)
Submitted PA via [facility] website for [facility]CS.

## 2018-08-26 NOTE — Patient Instructions (Signed)
Avoid straining.  Benefiber 1 tablespoon daily x3 weeks and increase to 1 tablespoon twice daily thereafter  Limit toilet time to 2-3 minutes  May use MiraLAX and stool softener on an as-needed basis but take Benefiber every day  Call with any interim problems  As discussed at length, diagnostic colonoscopy is most important to evaluate positive Cologuard further.  This procedure will be scheduled in about 3 to 4 weeks from now.  Will be done before your foot surgery. It is very important to get this done.  Putting this off any longer could jeopardize your health.

## 2018-08-26 NOTE — Telephone Encounter (Signed)
Pre-op scheduled for 09/18/18 at 11:00am. Patient aware and letter mailed.

## 2018-08-26 NOTE — Telephone Encounter (Signed)
Call placed to patient and patient made aware.   States that she thinks her anxiety about procedure caused her BP to rise.   States that she will gradually increase BP meds until BP consistently <140/90. Will call back in 1 week.

## 2018-08-27 ENCOUNTER — Telehealth: Payer: Self-pay

## 2018-08-27 ENCOUNTER — Telehealth: Payer: Self-pay | Admitting: Internal Medicine

## 2018-08-27 NOTE — Telephone Encounter (Signed)
Spoke with pt. She wanted RX sent into  out pt pharmacy. Spoke with pharmacist and they don't mix that type of medication up. Pt would like to hold medication being sent in. Pt will call back tomorrow if medication is needed.

## 2018-08-27 NOTE — Telephone Encounter (Signed)
Pt called with c/o pain s/p hemorrhoid banding yesterday 08/26/18. Pt doesn't feel any pinching or anything sharp. Pt sits down at work and went home today due to the pain. On a level 1-10, 10 being the worse, pts pain level is at a 9. Pt has been doing sitz baths since last night and hasn't had a bowel movement.  Please advise.

## 2018-08-27 NOTE — Telephone Encounter (Signed)
Noted.  Discussed with pt and since she has done a few sitz baths, she is feeling better. She will call back if she wants topical cr sent to her pharmacy.

## 2018-08-27 NOTE — Telephone Encounter (Signed)
Pt has called again asking for AM. I told her that AM wasn't available and I could take a message. Pt said she left a VM around 1130. She is wanting to pick up her medication (?cream) at [facility] on [facility] around 230 and wanted to make sure it had been sent.

## 2018-08-27 NOTE — Telephone Encounter (Signed)
Sorry to hear she is having trouble.  Discomfort. let's [place] know bands are doing what they are supposed to be doing. Would call in to [facility] hemorrhoid cream with Xylocaine added.  Applied to the anorectum 3 times daily as needed no refills.

## 2018-08-28 NOTE — Telephone Encounter (Signed)
Received call from [place] at [facility]. No PA is required. Ref# [id number] [facility]

## 2018-09-03 ENCOUNTER — Encounter: Payer: Self-pay | Admitting: Family Medicine

## 2018-09-03 MED ORDER — FLUTICASONE PROPIONATE 50 MCG/ACT NA SUSP: 2.0000 | Freq: Every day | NASAL | 3 refills | Status: DC

## 2018-09-03 MED FILL — FLUTICASONE PROP 50 MCG SPR: 50 | 90 days supply | Qty: 48 | Fill #0

## 2018-09-04 ENCOUNTER — Other Ambulatory Visit: Payer: Self-pay | Admitting: Family Medicine

## 2018-09-04 MED FILL — FLUOCINOLONE ACETONIDE SCAL: 0.01 | 30 days supply | Qty: 118 | Fill #3

## 2018-09-04 MED FILL — ALPRAZolam 0.25 MG TABS: 0.25 | 22 days supply | Qty: 45 | Fill #0

## 2018-09-04 NOTE — Telephone Encounter (Signed)
Ok to refill??  Last office visit 05/13/2018  Last refill 07/25/2018.

## 2018-09-11 ENCOUNTER — Telehealth: Payer: Self-pay | Admitting: *Deleted

## 2018-09-11 NOTE — Telephone Encounter (Signed)
Received surgical clearance forms from [place] and [place]. Patient is scheduled to have bunionectomy on 10/08/2018 and requires clearance.  Forms routed to provider.    Verbalized that fee may be charged and is per provider prerogative.   Will you complete clearance or do we need to wait for PCP to return? Please advise.

## 2018-09-12 NOTE — Telephone Encounter (Signed)
Routed forms to provider.

## 2018-09-12 NOTE — Telephone Encounter (Signed)
Yes I will fill out

## 2018-09-15 ENCOUNTER — Ambulatory Visit: Payer: 59 | Admitting: Family Medicine

## 2018-09-15 ENCOUNTER — Encounter (HOSPITAL_BASED_OUTPATIENT_CLINIC_OR_DEPARTMENT_OTHER): Payer: Self-pay | Admitting: *Deleted

## 2018-09-15 ENCOUNTER — Other Ambulatory Visit: Payer: Self-pay

## 2018-09-15 MED FILL — HYDROXYCHLOROQUINE SULFATE: 200 | 60 days supply | Qty: 120 | Fill #1

## 2018-09-15 NOTE — Progress Notes (Signed)
Patient has appt with PCP Dr Kevan Rosebush on 09-24-18 for H&P and labs. Gave her our fax # so they can send here when completed.

## 2018-09-15 NOTE — Patient Instructions (Signed)
Agata A Vandehei  09/15/2018     @PREFPERIOPPHARMACY @   Your procedure is scheduled on  09/25/2018  Report to [facility] at  48   A.M.  Call this number if you have problems the morning of surgery:  [phone]   Remember:  Follow the diet and prep instruction's  given  To you by Dr Roseanne Kaufman office.                   Take these medicines the morning of surgery with A SIP OF WATER  Xanax (if needed), nexium, plaquenil, losartan.    Do not wear jewelry, make-up or nail polish.  Do not wear lotions, powders, or perfumes, or deodorant.  Do not shave 48 hours prior to surgery.  Men may shave face and neck.  Do not bring valuables to the hospital.  [facility] is not responsible for any belongings or valuables.  Contacts, dentures or bridgework may not be worn into surgery.  Leave your suitcase in the car.  After surgery it may be brought to your room.  For patients admitted to the hospital, discharge time will be determined by your treatment team.  Patients discharged the day of surgery will not be allowed to drive home.   Name and phone number of your driver:   family Special instructions:  DO NOT take any medications for diabetes the morning of your procedure.  Please read over the following fact sheets that you were given. Anesthesia Post-op Instructions and Care and Recovery After Surgery       Colonoscopy, Adult A colonoscopy is an exam to look at the large intestine. It is done to check for problems, such as:  Lumps (tumors).  Growths (polyps).  Swelling (inflammation).  Bleeding.  What happens before the procedure? Eating and drinking Follow instructions from your doctor about eating and drinking. These instructions may include:  A few days before the procedure - follow a low-fiber diet. ? Avoid nuts. ? Avoid seeds. ? Avoid dried fruit. ? Avoid raw fruits. ? Avoid vegetables.  1-3 days before the procedure - follow a clear liquid diet.  Avoid liquids that have red or purple dye. Drink only clear liquids, such as: ? Clear broth or bouillon. ? Black coffee or tea. ? Clear juice. ? Clear soft drinks or sports drinks. ? Gelatin dessert. ? Popsicles.  On the day of the procedure - do not eat or drink anything during the 2 hours before the procedure.  Bowel prep If you were prescribed an oral bowel prep:  Take it as told by your doctor. Starting the day before your procedure, you will need to drink a lot of liquid. The liquid will cause you to poop (have bowel movements) until your poop is almost clear or light green.  If your skin or butt gets irritated from diarrhea, you may: ? Wipe the area with wipes that have medicine in them, such as adult wet wipes with aloe and vitamin E. ? Put something on your skin that soothes the area, such as petroleum jelly.  If you throw up (vomit) while drinking the bowel prep, take a break for up to 60 minutes. Then begin the bowel prep again. If you keep throwing up and you cannot take the bowel prep without throwing up, call your doctor.  General instructions  Ask your doctor about changing or stopping your normal medicines. This is important if you take diabetes  medicines or blood thinners.  Plan to have someone take you home from the hospital or clinic. What happens during the procedure?  An IV tube may be put into one of your veins.  You will be given medicine to help you relax (sedative).  To reduce your risk of infection: ? Your doctors will wash their hands. ? Your anal area will be washed with soap.  You will be asked to lie on your side with your knees bent.  Your doctor will get a long, thin, flexible tube ready. The tube will have a camera and a light on the end.  The tube will be put into your anus.  The tube will be gently put into your large intestine.  Air will be delivered into your large intestine to keep it open. You may feel some pressure or cramping.  The  camera will be used to take photos.  A small tissue sample may be removed from your body to be looked at under a microscope (biopsy). If any possible problems are found, the tissue will be sent to a lab for testing.  If small growths are found, your doctor may remove them and have them checked for cancer.  The tube that was put into your anus will be slowly removed. The procedure may vary among doctors and hospitals. What happens after the procedure?  Your doctor will check on you often until the medicines you were given have worn off.  Do not drive for 24 hours after the procedure.  You may have a small amount of blood in your poop.  You may pass gas.  You may have mild cramps or bloating in your belly (abdomen).  It is up to you to get the results of your procedure. Ask your doctor, or the department performing the procedure, when your results will be ready. This information is not intended to replace advice given to you by your health care provider. Make sure you discuss any questions you have with your health care provider. Document Released: 11/24/2010 Document Revised: 08/22/2016 Document Reviewed: 01/03/2016 Elsevier Interactive Patient Education  2017 [employer]  Colonoscopy, Adult, Care After This sheet gives you information about how to care for yourself after your procedure. Your health care provider may also give you more specific instructions. If you have problems or questions, contact your health care provider. What can I expect after the procedure? After the procedure, it is common to have:  A small amount of blood in your stool for 24 hours after the procedure.  Some gas.  Mild abdominal cramping or bloating.  Follow these instructions at home: General instructions   For the first 24 hours after the procedure: ? Do not drive or use machinery. ? Do not sign important documents. ? Do not drink alcohol. ? Do your regular daily activities at a slower pace  than normal. ? Eat soft, easy-to-digest foods. ? Rest often.  Take over-the-counter or prescription medicines only as told by your health care provider.  It is up to you to get the results of your procedure. Ask your health care provider, or the department performing the procedure, when your results will be ready. Relieving cramping and bloating  Try walking around when you have cramps or feel bloated.  Apply heat to your abdomen as told by your health care provider. Use a heat source that your health care provider recommends, such as a moist heat pack or a heating pad. ? Place a towel between your skin and  the heat source. ? Leave the heat on for 20-30 minutes. ? Remove the heat if your skin turns bright red. This is especially important if you are unable to feel pain, heat, or cold. You may have a greater risk of getting burned. Eating and drinking  Drink enough fluid to keep your urine clear or pale yellow.  Resume your normal diet as instructed by your health care provider. Avoid heavy or fried foods that are hard to digest.  Avoid drinking alcohol for as long as instructed by your health care provider. Contact a health care provider if:  You have blood in your stool 2-3 days after the procedure. Get help right away if:  You have more than a small spotting of blood in your stool.  You pass large blood clots in your stool.  Your abdomen is swollen.  You have nausea or vomiting.  You have a fever.  You have increasing abdominal pain that is not relieved with medicine. This information is not intended to replace advice given to you by your health care provider. Make sure you discuss any questions you have with your health care provider. Document Released: 06/05/2004 Document Revised: 07/16/2016 Document Reviewed: 01/03/2016 Elsevier Interactive Patient Education  2018 [place] Anesthesia is a term that refers to techniques, procedures, and  medicines that help a person stay safe and comfortable during a medical procedure. Monitored anesthesia care, or sedation, is one type of anesthesia. Your anesthesia specialist may recommend sedation if you will be having a procedure that does not require you to be unconscious, such as:  Cataract surgery.  A dental procedure.  A biopsy.  A colonoscopy.  During the procedure, you may receive a medicine to help you relax (sedative). There are three levels of sedation:  Mild sedation. At this level, you may feel awake and relaxed. You will be able to follow directions.  Moderate sedation. At this level, you will be sleepy. You may not remember the procedure.  Deep sedation. At this level, you will be asleep. You will not remember the procedure.  The more medicine you are given, the deeper your level of sedation will be. Depending on how you respond to the procedure, the anesthesia specialist may change your level of sedation or the type of anesthesia to fit your needs. An anesthesia specialist will monitor you closely during the procedure. Let your health care provider know about:  Any allergies you have.  All medicines you are taking, including vitamins, herbs, eye drops, creams, and over-the-counter medicines.  Any use of steroids (by mouth or as a cream).  Any problems you or family members have had with sedatives and anesthetic medicines.  Any blood disorders you have.  Any surgeries you have had.  Any medical conditions you have, such as sleep apnea.  Whether you are pregnant or may be pregnant.  Any use of cigarettes, alcohol, or street drugs. What are the risks? Generally, this is a safe procedure. However, problems may occur, including:  Getting too much medicine (oversedation).  Nausea.  Allergic reaction to medicines.  Trouble breathing. If this happens, a breathing tube may be used to help with breathing. It will be removed when you are awake and breathing on  your own.  Heart trouble.  Lung trouble.  Before the procedure Staying hydrated Follow instructions from your health care provider about hydration, which may include:  Up to 2 hours before the procedure - you may continue to drink clear liquids,  such as water, clear fruit juice, black coffee, and plain tea.  Eating and drinking restrictions Follow instructions from your health care provider about eating and drinking, which may include:  8 hours before the procedure - stop eating heavy meals or foods such as meat, fried foods, or fatty foods.  6 hours before the procedure - stop eating light meals or foods, such as toast or cereal.  6 hours before the procedure - stop drinking milk or drinks that contain milk.  2 hours before the procedure - stop drinking clear liquids.  Medicines Ask your health care provider about:  Changing or stopping your regular medicines. This is especially important if you are taking diabetes medicines or blood thinners.  Taking medicines such as aspirin and ibuprofen. These medicines can thin your blood. Do not take these medicines before your procedure if your health care provider instructs you not to.  Tests and exams  You will have a physical exam.  You may have blood tests done to show: ? How well your kidneys and liver are working. ? How well your blood can clot.  General instructions  Plan to have someone take you home from the hospital or clinic.  If you will be going home right after the procedure, plan to have someone with you for 24 hours.  What happens during the procedure?  Your blood pressure, heart rate, breathing, level of pain and overall condition will be monitored.  An IV tube will be inserted into one of your veins.  Your anesthesia specialist will give you medicines as needed to keep you comfortable during the procedure. This may mean changing the level of sedation.  The procedure will be performed. After the  procedure  Your blood pressure, heart rate, breathing rate, and blood oxygen level will be monitored until the medicines you were given have worn off.  Do not drive for 24 hours if you received a sedative.  You may: ? Feel sleepy, clumsy, or nauseous. ? Feel forgetful about what happened after the procedure. ? Have a sore throat if you had a breathing tube during the procedure. ? Vomit. This information is not intended to replace advice given to you by your health care provider. Make sure you discuss any questions you have with your health care provider. Document Released: 07/18/2005 Document Revised: 03/30/2016 Document Reviewed: 02/12/2016 Elsevier Interactive Patient Education  2018 [place], Care After These instructions provide you with information about caring for yourself after your procedure. Your health care provider may also give you more specific instructions. Your treatment has been planned according to current medical practices, but problems sometimes occur. Call your health care provider if you have any problems or questions after your procedure. What can I expect after the procedure? After your procedure, it is common to:  Feel sleepy for several hours.  Feel clumsy and have poor balance for several hours.  Feel forgetful about what happened after the procedure.  Have poor judgment for several hours.  Feel nauseous or vomit.  Have a sore throat if you had a breathing tube during the procedure.  Follow these instructions at home: For at least 24 hours after the procedure:   Do not: ? Participate in activities in which you could fall or become injured. ? Drive. ? Use heavy machinery. ? Drink alcohol. ? Take sleeping pills or medicines that cause drowsiness. ? Make important decisions or sign legal documents. ? Take care of children on your own.  Rest.  Eating and drinking  Follow the diet that is recommended by your health  care provider.  If you vomit, drink water, juice, or soup when you can drink without vomiting.  Make sure you have little or no nausea before eating solid foods. General instructions  Have a responsible adult stay with you until you are awake and alert.  Take over-the-counter and prescription medicines only as told by your health care provider.  If you smoke, do not smoke without supervision.  Keep all follow-up visits as told by your health care provider. This is important. Contact a health care provider if:  You keep feeling nauseous or you keep vomiting.  You feel light-headed.  You develop a rash.  You have a fever. Get help right away if:  You have trouble breathing. This information is not intended to replace advice given to you by your health care provider. Make sure you discuss any questions you have with your health care provider. Document Released: 02/12/2016 Document Revised: 06/13/2016 Document Reviewed: 02/12/2016 Elsevier Interactive Patient Education  [employer].

## 2018-09-16 ENCOUNTER — Encounter: Payer: Self-pay | Admitting: Family Medicine

## 2018-09-16 ENCOUNTER — Other Ambulatory Visit: Payer: Self-pay | Admitting: Family Medicine

## 2018-09-16 MED FILL — metFORMIN HCL 500 MG TABS: 500 | 45 days supply | Qty: 180 | Fill #1

## 2018-09-16 MED FILL — ACCU-CHEK FASTCLIX LANCETS: 90 days supply | Qty: 306 | Fill #0

## 2018-09-16 MED FILL — ACCU-CHEK GUIDE STRP: 90 days supply | Qty: 300 | Fill #4

## 2018-09-16 NOTE — Telephone Encounter (Signed)
Received completed forms.   Faxed to [facility].

## 2018-09-18 ENCOUNTER — Encounter (HOSPITAL_COMMUNITY)
Admission: RE | Admit: 2018-09-18 | Discharge: 2018-09-18 | Disposition: A | Discharge status: Home / Self Care | Payer: 59 | Source: Ambulatory Visit | Attending: Internal Medicine | Admitting: Internal Medicine

## 2018-09-18 ENCOUNTER — Encounter (HOSPITAL_COMMUNITY): Payer: Self-pay

## 2018-09-18 ENCOUNTER — Other Ambulatory Visit: Payer: Self-pay

## 2018-09-18 ENCOUNTER — Other Ambulatory Visit (HOSPITAL_COMMUNITY): Payer: 59

## 2018-09-18 DIAGNOSIS — Z01812 Encounter for preprocedural laboratory examination: Secondary | ICD-10-CM | POA: Insufficient documentation

## 2018-09-18 LAB — CBC WITH DIFFERENTIAL/PLATELET
ABS IMMATURE GRANULOCYTES: 0.01 10*3/uL (ref 0.00–0.07)
BASOS PCT: 1 %
Basophils Absolute: 0.1 10*3/uL (ref 0.0–0.1)
Eosinophils Absolute: 0.3 10*3/uL (ref 0.0–0.5)
Eosinophils Relative: 4 %
HCT: 40 % (ref 36.0–46.0)
Hemoglobin: 12.9 g/dL (ref 12.0–15.0)
IMMATURE GRANULOCYTES: 0 %
Lymphocytes Relative: 38 %
Lymphs Abs: 2.4 10*3/uL (ref 0.7–4.0)
MCH: 26.7 pg (ref 26.0–34.0)
MCHC: 32.3 g/dL (ref 30.0–36.0)
MCV: 82.8 fL (ref 80.0–100.0)
MONOS PCT: 9 %
Monocytes Absolute: 0.6 10*3/uL (ref 0.1–1.0)
NEUTROS ABS: 3 10*3/uL (ref 1.7–7.7)
NEUTROS PCT: 48 %
PLATELETS: 306 10*3/uL (ref 150–400)
RBC: 4.83 MIL/uL (ref 3.87–5.11)
RDW: 14 % (ref 11.5–15.5)
WBC: 6.3 10*3/uL (ref 4.0–10.5)
nRBC: 0 % (ref 0.0–0.2)

## 2018-09-18 LAB — BASIC METABOLIC PANEL
ANION GAP: 9 (ref 5–15)
BUN: 12 mg/dL (ref 6–20)
CALCIUM: 9.2 mg/dL (ref 8.9–10.3)
CO2: 25 mmol/L (ref 22–32)
Chloride: 104 mmol/L (ref 98–111)
Creatinine, Ser: 0.73 mg/dL (ref 0.44–1.00)
GLUCOSE: 101 mg/dL — AB (ref 70–99)
POTASSIUM: 3.3 mmol/L — AB (ref 3.5–5.1)
SODIUM: 138 mmol/L (ref 135–145)

## 2018-09-24 ENCOUNTER — Ambulatory Visit: Payer: 59 | Admitting: Family Medicine

## 2018-09-24 ENCOUNTER — Encounter: Payer: Self-pay | Admitting: Family Medicine

## 2018-09-24 ENCOUNTER — Encounter: Payer: Self-pay | Admitting: *Deleted

## 2018-09-24 VITALS — BP 140/82 | HR 82 | Temp 98.0°F | Resp 16 | Ht 67.0 in | Wt 256.0 lb

## 2018-09-24 DIAGNOSIS — F411 Generalized anxiety disorder: Secondary | ICD-10-CM | POA: Diagnosis not present

## 2018-09-24 DIAGNOSIS — I10 Essential (primary) hypertension: Secondary | ICD-10-CM | POA: Diagnosis not present

## 2018-09-24 DIAGNOSIS — E782 Mixed hyperlipidemia: Secondary | ICD-10-CM

## 2018-09-24 DIAGNOSIS — E119 Type 2 diabetes mellitus without complications: Secondary | ICD-10-CM | POA: Diagnosis not present

## 2018-09-24 NOTE — Patient Instructions (Addendum)
Send me your blood pressures in 2-3 weeks  Please give note for work for today  F/U 25months

## 2018-09-24 NOTE — Progress Notes (Signed)
   Subjective:    Patient ID: Kelly [name] Age, female    DOB: 1961-08-11, 57 y.o.   MRN: [id number]  Patient presents for Medication Management (pt fasting) and Medication Refill Patient here to follow-up diabetes mellitus-her last A1c in July was 5.6%.  Her fasting blood sugars range from 93 to the low 120s.  She has a log downloaded from her glucometer that I reviewed. At the last visit discussed starting statin drug which she declined.  LDL was elevated at 136 Metformin was also reduced to 500 mg once a day This morning this 105  Hypertension she is taking her losartan HCTZ as prescribed   BP has been elevated up to  160's/ 90's    Range 120-132/73-86  Anxiety-she continues to take the alprazolam as needed  Flu shot done at work   Medications reviewed   Bunionectomy on the 4th of December  Colonoscopy- tomorrow    Had preop labs - reviewed  Review Of Systems:  GEN- denies fatigue, fever, weight loss,weakness, recent illness HEENT- denies eye drainage, change in vision, nasal discharge, CVS- denies chest pain, palpitations RESP- denies SOB, cough, wheeze ABD- denies N/V, change in stools, abd pain GU- denies dysuria, hematuria, dribbling, incontinence MSK- + joint pain, muscle aches, injury Neuro- denies headache, dizziness, syncope, seizure activity       Objective:    BP 140/82   Pulse 82   Temp 98 F (36.7 C) (Oral)   Resp 16   Ht 5\' 7"  (1.702 m)   Wt 256 lb (116.1 kg)   SpO2 99%   BMI 40.10 kg/m  GEN- NAD, alert and oriented x3,obese HEENT- PERRL, EOMI, non injected sclera, pink conjunctiva, MMM, oropharynx clear Neck- Supple, no thyromegaly CVS- RRR, no murmur RESP-CTAB ABD-NABS,soft,NT,ND EXT- No edema Pulses- Radial, DP- 2+        Assessment & Plan:      Problem List Items Addressed This Visit      Unprioritized   Diabetes mellitus, type II ([place]) - Primary    Recheck A1C if < 6.5% will d/c the metformin Needs significant weight  loss Continue working on dietary changes Discussed importance of statin drug for cardiovascular protection She is on ARB and ASA   She has upcoming colonoscopy and bunion surgery, she is medically cleared for both procedures      Relevant Orders   Lipid panel (Completed)   Hemoglobin A1c (Completed)   Essential hypertension    Mildly elevated, may need to increase the hyzaar will see how BP is after her surgeries, she will monitor at home      GAD (generalized anxiety disorder)    Continue prn xanax      Hyperlipidemia   Relevant Orders   Lipid panel (Completed)   Morbid obesity ([place])      Note: This dictation was prepared with Dragon dictation along with smaller phrase technology. Any transcriptional errors that result from this process are unintentional.

## 2018-09-25 ENCOUNTER — Ambulatory Visit (HOSPITAL_COMMUNITY): Payer: 59 | Admitting: Anesthesiology

## 2018-09-25 ENCOUNTER — Ambulatory Visit (HOSPITAL_COMMUNITY)
Admission: RE | Admit: 2018-09-25 | Discharge: 2018-09-25 | Disposition: A | Discharge status: Home / Self Care | Payer: 59 | Source: Ambulatory Visit | Attending: Internal Medicine | Admitting: Internal Medicine

## 2018-09-25 ENCOUNTER — Other Ambulatory Visit: Payer: Self-pay

## 2018-09-25 ENCOUNTER — Encounter (HOSPITAL_COMMUNITY)
Admission: RE | Disposition: A | Discharge status: Home / Self Care | Payer: Self-pay | Source: Ambulatory Visit | Attending: Internal Medicine

## 2018-09-25 ENCOUNTER — Encounter: Payer: Self-pay | Admitting: Family Medicine

## 2018-09-25 ENCOUNTER — Encounter (HOSPITAL_COMMUNITY): Payer: Self-pay | Admitting: *Deleted

## 2018-09-25 DIAGNOSIS — Z6841 Body Mass Index (BMI) 40.0 and over, adult: Secondary | ICD-10-CM | POA: Diagnosis not present

## 2018-09-25 DIAGNOSIS — E785 Hyperlipidemia, unspecified: Secondary | ICD-10-CM | POA: Diagnosis not present

## 2018-09-25 DIAGNOSIS — D125 Benign neoplasm of sigmoid colon: Secondary | ICD-10-CM | POA: Insufficient documentation

## 2018-09-25 DIAGNOSIS — I10 Essential (primary) hypertension: Secondary | ICD-10-CM | POA: Diagnosis not present

## 2018-09-25 DIAGNOSIS — E119 Type 2 diabetes mellitus without complications: Secondary | ICD-10-CM | POA: Insufficient documentation

## 2018-09-25 DIAGNOSIS — R195 Other fecal abnormalities: Secondary | ICD-10-CM

## 2018-09-25 DIAGNOSIS — Z7984 Long term (current) use of oral hypoglycemic drugs: Secondary | ICD-10-CM | POA: Diagnosis not present

## 2018-09-25 DIAGNOSIS — K219 Gastro-esophageal reflux disease without esophagitis: Secondary | ICD-10-CM | POA: Insufficient documentation

## 2018-09-25 DIAGNOSIS — Z79899 Other long term (current) drug therapy: Secondary | ICD-10-CM | POA: Insufficient documentation

## 2018-09-25 DIAGNOSIS — K643 Fourth degree hemorrhoids: Secondary | ICD-10-CM | POA: Insufficient documentation

## 2018-09-25 DIAGNOSIS — K644 Residual hemorrhoidal skin tags: Secondary | ICD-10-CM | POA: Diagnosis not present

## 2018-09-25 DIAGNOSIS — F419 Anxiety disorder, unspecified: Secondary | ICD-10-CM | POA: Insufficient documentation

## 2018-09-25 HISTORY — PX: COLONOSCOPY WITH PROPOFOL: SHX5780

## 2018-09-25 HISTORY — PX: POLYPECTOMY: SHX5525

## 2018-09-25 LAB — HEMOGLOBIN A1C
HEMOGLOBIN A1C: 5.8 %{Hb} — AB (ref <5.7)
MEAN PLASMA GLUCOSE: 120 (calc)
eAG (mmol/L): 6.6 (calc)

## 2018-09-25 LAB — LIPID PANEL
CHOL/HDL RATIO: 3.7 (calc) (ref <5.0)
CHOLESTEROL: 194 mg/dL (ref <200)
HDL: 52 mg/dL (ref >50)
LDL CHOLESTEROL (CALC): 122 mg/dL — AB
Non-HDL Cholesterol (Calc): 142 mg/dL (calc) — ABNORMAL HIGH (ref <130)
TRIGLYCERIDES: 95 mg/dL (ref <150)

## 2018-09-25 LAB — GLUCOSE, CAPILLARY
GLUCOSE-CAPILLARY: 113 mg/dL — AB (ref 70–99)
Glucose-Capillary: 91 mg/dL (ref 70–99)

## 2018-09-25 SURGERY — COLONOSCOPY WITH PROPOFOL
Anesthesia: Monitor Anesthesia Care

## 2018-09-25 MED ORDER — MIDAZOLAM HCL 5 MG/5ML IJ SOLN
INTRAMUSCULAR | Status: DC | PRN
  Administered 2018-09-25: 2 mg via INTRAVENOUS

## 2018-09-25 MED ORDER — PROPOFOL 10 MG/ML IV BOLUS
INTRAVENOUS | Status: AC
  Filled 2018-09-25: qty 40

## 2018-09-25 MED ORDER — CHLORHEXIDINE GLUCONATE CLOTH 2 % EX PADS: 6.0000 | MEDICATED_PAD | Freq: Once | CUTANEOUS | Status: DC

## 2018-09-25 MED ORDER — STERILE WATER FOR IRRIGATION IR SOLN
Status: DC | PRN
  Administered 2018-09-25: 1.5 mL

## 2018-09-25 MED ORDER — MIDAZOLAM HCL 2 MG/2ML IJ SOLN: 0.5000 mg | Freq: Once | INTRAMUSCULAR | Status: DC | PRN

## 2018-09-25 MED ORDER — MIDAZOLAM HCL 2 MG/2ML IJ SOLN
INTRAMUSCULAR | Status: AC
  Filled 2018-09-25: qty 2

## 2018-09-25 MED ORDER — PROPOFOL 10 MG/ML IV BOLUS
INTRAVENOUS | Status: DC | PRN
  Administered 2018-09-25: 15 mg via INTRAVENOUS

## 2018-09-25 MED ORDER — PROPOFOL 500 MG/50ML IV EMUL
INTRAVENOUS | Status: DC | PRN
  Administered 2018-09-25: 125 ug/kg/min via INTRAVENOUS
  Administered 2018-09-25: 11:00:00 via INTRAVENOUS

## 2018-09-25 MED ORDER — PROMETHAZINE HCL 25 MG/ML IJ SOLN: 6.2500 mg | INTRAMUSCULAR | Status: DC | PRN

## 2018-09-25 MED ORDER — LACTATED RINGERS IV SOLN
INTRAVENOUS | Status: DC
  Administered 2018-09-25: 11:00:00 via INTRAVENOUS

## 2018-09-25 NOTE — Discharge Instructions (Signed)
Colonoscopy Discharge Instructions  Read the instructions outlined below and refer to this sheet in the next few weeks. These discharge instructions provide you with general information on caring for yourself after you leave the hospital. Your doctor may also give you specific instructions. While your treatment has been planned according to the most current medical practices available, unavoidable complications occasionally occur. If you have any problems or questions after discharge, call Dr. Gala [name] at [phone]. ACTIVITY  You may resume your regular activity, but move at a slower pace for the next 24 hours.   Take frequent rest periods for the next 24 hours.   Walking will help get rid of the air and reduce the bloated feeling in your belly (abdomen).   No driving for 24 hours (because of the medicine (anesthesia) used during the test).    Do not sign any important legal documents or operate any machinery for 24 hours (because of the anesthesia used during the test).  NUTRITION  Drink plenty of fluids.   You may resume your normal diet as instructed by your doctor.   Begin with a light meal and progress to your normal diet. Heavy or fried foods are harder to digest and may make you feel sick to your stomach (nauseated).   Avoid alcoholic beverages for 24 hours or as instructed.  MEDICATIONS  You may resume your normal medications unless your doctor tells you otherwise.  WHAT YOU CAN EXPECT TODAY  Some feelings of bloating in the abdomen.   Passage of more gas than usual.   Spotting of blood in your stool or on the toilet paper.  IF YOU HAD POLYPS REMOVED DURING THE COLONOSCOPY:  No aspirin products for 7 days or as instructed.   No alcohol for 7 days or as instructed.   Eat a soft diet for the next 24 hours.  FINDING OUT THE RESULTS OF YOUR TEST Not all test results are available during your visit. If your test results are not back during the visit, make an appointment  with your caregiver to find out the results. Do not assume everything is normal if you have not heard from your caregiver or the medical facility. It is important for you to follow up on all of your test results.  SEEK IMMEDIATE MEDICAL ATTENTION IF:  You have more than a spotting of blood in your stool.   Your belly is swollen (abdominal distention).   You are nauseated or vomiting.   You have a temperature over 101.  You have abdominal pain or discomfort that is severe or gets worse throughout the day.    Polyp and hemorrhoid information provided  You have large hemorrhoids not likely to be helped with further banding  Further recommendations to follow pending review of pathology report  Office visit with [place] in 2 months.     Colon Polyps Polyps are tissue growths inside the body. Polyps can grow in many places, including the large intestine (colon). A polyp may be a round bump or a mushroom-shaped growth. You could have one polyp or several. Most colon polyps are noncancerous (benign). However, some colon polyps can become cancerous over time. What are the causes? The exact cause of colon polyps is not known. What increases the risk? This condition is more likely to develop in people who:  Have a family history of colon cancer or colon polyps.  Are older than 59 or older than 45 if they are African American.  Have inflammatory bowel disease, such  as ulcerative colitis or Crohn disease.  Are overweight.  Smoke cigarettes.  Do not get enough exercise.  Drink too much alcohol.  Eat a diet that is: ? High in fat and red meat. ? Low in fiber.  Had childhood cancer that was treated with abdominal radiation.  What are the signs or symptoms? Most polyps do not cause symptoms. If you have symptoms, they may include:  Blood coming from your rectum when having a bowel movement.  Blood in your stool.The stool may look dark red or black.  A change in bowel habits,  such as constipation or diarrhea.  How is this diagnosed? This condition is diagnosed with a colonoscopy. This is a procedure that uses a lighted, flexible scope to look at the inside of your colon. How is this treated? Treatment for this condition involves removing any polyps that are found. Those polyps will then be tested for cancer. If cancer is found, your health care provider will talk to you about options for colon cancer treatment. Follow these instructions at home: Diet  Eat plenty of fiber, such as fruits, vegetables, and whole grains.  Eat foods that are high in calcium and vitamin D, such as milk, cheese, yogurt, eggs, liver, fish, and broccoli.  Limit foods high in fat, red meats, and processed meats, such as hot dogs, sausage, bacon, and lunch meats.  Maintain a healthy weight, or lose weight if recommended by your health care provider. General instructions  Do not smoke cigarettes.  Do not drink alcohol excessively.  Keep all follow-up visits as told by your health care provider. This is important. This includes keeping regularly scheduled colonoscopies. Talk to your health care provider about when you need a colonoscopy.  Exercise every day or as told by your health care provider. Contact a health care provider if:  You have new or worsening bleeding during a bowel movement.  You have new or increased blood in your stool.  You have a change in bowel habits.  You unexpectedly lose weight. This information is not intended to replace advice given to you by your health care provider. Make sure you discuss any questions you have with your health care provider. Document Released: 07/18/2004 Document Revised: 03/29/2016 Document Reviewed: 09/12/2015 Elsevier Interactive Patient Education  2018 [employer].     Hemorrhoids Hemorrhoids are swollen veins in and around the rectum or anus. There are two types of hemorrhoids:  Internal hemorrhoids. These occur in the  veins that are just inside the rectum. They may poke through to the outside and become irritated and painful.  External hemorrhoids. These occur in the veins that are outside of the anus and can be felt as a painful swelling or hard lump near the anus.  Most hemorrhoids do not cause serious problems, and they can be managed with home treatments such as diet and lifestyle changes. If home treatments do not help your symptoms, procedures can be done to shrink or remove the hemorrhoids. What are the causes? This condition is caused by increased pressure in the anal area. This pressure may result from various things, including:  Constipation.  Straining to have a bowel movement.  Diarrhea.  Pregnancy.  Obesity.  Sitting for long periods of time.  Heavy lifting or other activity that causes you to strain.  Anal sex.  What are the signs or symptoms? Symptoms of this condition include:  Pain.  Anal itching or irritation.  Rectal bleeding.  Leakage of stool (feces).  Anal swelling.  One or more lumps around the anus.  How is this diagnosed? This condition can often be diagnosed through a visual exam. Other exams or tests may also be done, such as:  Examination of the rectal area with a gloved hand (digital rectal exam).  Examination of the anal canal using a small tube (anoscope).  A blood test, if you have lost a significant amount of blood.  A test to look inside the colon (sigmoidoscopy or colonoscopy).  How is this treated? This condition can usually be treated at home. However, various procedures may be done if dietary changes, lifestyle changes, and other home treatments do not help your symptoms. These procedures can help make the hemorrhoids smaller or remove them completely. Some of these procedures involve surgery, and others do not. Common procedures include:  Rubber band ligation. Rubber bands are placed at the base of the hemorrhoids to cut off the blood  supply to them.  Sclerotherapy. Medicine is injected into the hemorrhoids to shrink them.  Infrared coagulation. A type of light energy is used to get rid of the hemorrhoids.  Hemorrhoidectomy surgery. The hemorrhoids are surgically removed, and the veins that supply them are tied off.  Stapled hemorrhoidopexy surgery. A circular stapling device is used to remove the hemorrhoids and use staples to cut off the blood supply to them.  Follow these instructions at home: Eating and drinking  Eat foods that have a lot of fiber in them, such as whole grains, beans, nuts, fruits, and vegetables. Ask your health care provider about taking products that have added fiber (fiber supplements).  Drink enough fluid to keep your urine clear or pale yellow. Managing pain and swelling  Take warm sitz baths for 20 minutes, 3-4 times a day to ease pain and discomfort.  If directed, apply ice to the affected area. Using ice packs between sitz baths may be helpful. ? Put ice in a plastic bag. ? Place a towel between your skin and the bag. ? Leave the ice on for 20 minutes, 2-3 times a day. General instructions  Take over-the-counter and prescription medicines only as told by your health care provider.  Use medicated creams or suppositories as told.  Exercise regularly.  Go to the bathroom when you have the urge to have a bowel movement. Do not wait.  Avoid straining to have bowel movements.  Keep the anal area dry and clean. Use wet toilet paper or moist towelettes after a bowel movement.  Do not sit on the toilet for long periods of time. This increases blood pooling and pain. Contact a health care provider if:  You have increasing pain and swelling that are not controlled by treatment or medicine.  You have uncontrolled bleeding.  You have difficulty having a bowel movement, or you are unable to have a bowel movement.  You have pain or inflammation outside the area of the  hemorrhoids. This information is not intended to replace advice given to you by your health care provider. Make sure you discuss any questions you have with your health care provider. Document Released: 10/19/2000 Document Revised: 03/21/2016 Document Reviewed: 07/06/2015 Elsevier Interactive Patient Education  [employer].

## 2018-09-25 NOTE — Assessment & Plan Note (Addendum)
Recheck A1C if < 6.5% will d/c the metformin Needs significant weight loss Continue working on dietary changes Discussed importance of statin drug for cardiovascular protection She is on ARB and ASA   She has upcoming colonoscopy and bunion surgery, she is medically cleared for both procedures

## 2018-09-25 NOTE — Anesthesia Postprocedure Evaluation (Signed)
Anesthesia Post Note  Patient: Kelly [name]  Procedure(s) Performed: COLONOSCOPY WITH PROPOFOL (N/A ) POLYPECTOMY  Patient location during evaluation: PACU Anesthesia Type: MAC and General Level of consciousness: awake and patient cooperative Pain management: pain level controlled Vital Signs Assessment: post-procedure vital signs reviewed and stable Respiratory status: spontaneous breathing, nonlabored ventilation and respiratory function stable Cardiovascular status: blood pressure returned to baseline Postop Assessment: no apparent nausea or vomiting Anesthetic complications: no     Last Vitals:  Vitals:   09/25/18 1050 09/25/18 1121  BP:  102/66  Pulse:  92  Resp:  17  Temp:  36.5 C  SpO2: 94% 99%    Last Pain:  Vitals:   09/25/18 1052  TempSrc:   PainSc: 0-No pain                 Shantel Helwig J

## 2018-09-25 NOTE — Transfer of Care (Signed)
Immediate Anesthesia Transfer of Care Note  Patient: Kelly [name]  Procedure(s) Performed: COLONOSCOPY WITH PROPOFOL (N/A ) POLYPECTOMY  Patient Location: PACU  Anesthesia Type:General  Level of Consciousness: awake and patient cooperative  Airway & Oxygen Therapy: Patient Spontanous Breathing and Patient connected to nasal cannula oxygen  Post-op Assessment: Report given to RN and Post -op Vital signs reviewed and stable  Post vital signs: Reviewed and stable  Last Vitals:  Vitals Value Taken Time  BP 102/66 09/25/2018 11:21 AM  Temp 36.5 C 09/25/2018 11:21 AM  Pulse 93 09/25/2018 11:22 AM  Resp 17 09/25/2018 11:22 AM  SpO2 99 % 09/25/2018 11:22 AM  Vitals shown include unvalidated device data.  Last Pain:  Vitals:   09/25/18 1052  TempSrc:   PainSc: 0-No pain      Patients Stated Pain Goal: 8 (91/91/66 0600)  Complications: No apparent anesthesia complications

## 2018-09-25 NOTE — Assessment & Plan Note (Signed)
Continue prn xanax

## 2018-09-25 NOTE — H&P (Signed)
@LOGO @   Primary Care Physician:  Alycia Rossetti, MD Primary Gastroenterologist:  Dr. Gala Romney  Pre-Procedure History & Physical: HPI:  Kelly [name] is a 57 y.o. female here for diagnostic colonoscopy.  Positive fecal DNA test.   Recent hemorrhoid banding.  Past Medical History:  Diagnosis Date  . Allergy   . Anxiety   . Depression   . Diabetes mellitus without complication ([place])   . GERD (gastroesophageal reflux disease)   . Headache disorder   . Hyperlipidemia   . Hypertension   . IBS (irritable bowel syndrome)   . Obesity   . Rheumatoid arthritis ([facility])    RA  . Salivary calculus   . Seasonal allergies     Past Surgical History:  Procedure Laterality Date  . ABDOMINAL HYSTERECTOMY  2001   partial hysterectomy  . REDUCTION MAMMAPLASTY Bilateral 04/25/2015  . SALIVARY GLAND SURGERY    . TEMPOROMANDIBULAR JOINT SURGERY     3 times    Prior to Admission medications   Medication Sig Start Date End Date Taking? Authorizing Provider  ALPRAZolam (XANAX) 0.25 MG tablet TAKE 1 TABLET BY MOUTH TWICE A DAY AS NEEDED FOR ANXIETY Patient taking differently: Take 0.25 mg by mouth 2 (two) times daily as needed for anxiety. TAKE 1 TABLET BY MOUTH TWICE A DAY AS NEEDED FOR ANXIETY 09/04/18  Yes Susy Frizzle, MD  aspirin 81 MG tablet Take 81 mg by mouth daily.   Yes [provider]  cetirizine (ZYRTEC) 10 MG tablet Take 10 mg by mouth daily.   Yes [provider]  Cyanocobalamin (VITAMIN B-12) 2500 MCG SUBL 1 tablet daily Patient taking differently: Take 2,500 mcg by mouth daily. 1 tablet daily 05/13/18  Yes [place], Modena Nunnery, MD  diclofenac sodium (VOLTAREN) 1 % GEL Apply 2 g topically daily as needed (pain).   Yes [provider]  esomeprazole (NEXIUM) 40 MG capsule Take 1 capsule (40 mg total) by mouth daily. 11/06/17  Yes [place], Modena Nunnery, MD  Fluocinolone Acetonide Scalp 0.01 % OIL Apply 1 application topically daily as needed (flare ups).  01/08/18  Yes  [provider]  fluticasone (FLONASE) 50 MCG/ACT nasal spray Place 2 sprays into both nostrils daily. Patient taking differently: Place 2 sprays into both nostrils daily as needed for allergies.  09/03/18  Yes [place], Modena Nunnery, MD  Garlic 2409 MG CAPS Take 1,000 mg by mouth daily.    Yes [provider]  hydroxychloroquine (PLAQUENIL) 200 MG tablet TAKE 1 TABLET BY MOUTH TWICE DAILY Patient taking differently: Take 200 mg by mouth 2 (two) times daily. TAKE 1 TABLET BY MOUTH TWICE DAILY 06/05/18  Yes Ofilia Neas, PA-C  ibuprofen (ADVIL,MOTRIN) 800 MG tablet Take 800 mg by mouth daily as needed for moderate pain.    Yes [provider]  losartan-hydrochlorothiazide (HYZAAR) 50-12.5 MG tablet Take 0.5 tablets by mouth daily. 11/06/17  Yes [place], Modena Nunnery, MD  metFORMIN (GLUCOPHAGE) 500 MG tablet Take 1 tablet (500 mg total) by mouth daily with breakfast. 05/15/18  Yes [place], Modena Nunnery, MD  Omega-3 Fatty Acids (FISH OIL PO) Take 1 capsule by mouth 3 (three) times a week.    Yes [provider]  Prenatal Vit-Fe Fumarate-FA (PRENATAL MULTIVITAMIN) TABS tablet Take 1 tablet by mouth daily at 12 noon.   Yes [provider]  TURMERIC PO Take 1 tablet by mouth daily.    Yes [provider]  ACCU-CHEK FASTCLIX LANCETS MISC USE TO MONITOR BLOOD  SUGAR 3 TIMES DAILY 03/24/18   Alycia Rossetti, MD  ACCU-CHEK FASTCLIX LANCETS MISC USE TO MONITOR BLOOD SUGAR 3 TIMES DAILY 09/16/18   Alycia Rossetti, MD  Glucose Blood (BLOOD GLUCOSE TEST STRIPS) STRP Please dispense based on patient and insurance preference. Use as directed to monitor FSBS 3x daily. Dx: E11.65. 03/24/18   Alycia Rossetti, MD    Allergies as of 08/26/2018 - Review Complete 08/26/2018  Allergen Reaction Noted  . Codeine Nausea Only 08/08/2012  . Topamax [topiramate] Nausea Only 08/08/2012  . Maxalt [rizatriptan] Nausea Only 02/18/2013    Family History  Problem Relation Age of Onset   . Heart attack Father        MI at age 37  . Coronary artery disease Brother   . Diabetes Brother   . Coronary artery disease Brother   . Heart disease Brother   . Lung cancer Mother 74       +TOBACCO, had quit many years before diagnosis  . COPD Brother        ?Melanoma  . Lung cancer Unknown   . Hypertension Unknown   . Colon cancer Neg Hx     Social History   Socioeconomic History  . Marital status: Single    Spouse name: n/a  . Number of children: 0  . Years of education: Associates  . Highest education level: Not on file  Occupational History  . 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 Occupation: Forensic psychologist    Comment: Day Surgery  Social Needs  . Financial resource strain: Not on file  . Food insecurity:    Worry: Not on file    Inability: Not on file  . Transportation needs:    Medical: Not on file    Non-medical: Not on file  Tobacco Use  . Smoking status: Never Smoker  . Smokeless tobacco: Never Used  Substance and Sexual Activity  . Alcohol use: No    Alcohol/week: 0.0 standard drinks    Frequency: Never  . Drug use: No  . Sexual activity: Not Currently    Birth control/protection: Surgical  Lifestyle  . Physical activity:    Days per week: Not on file    Minutes per session: Not on file  . Stress: Not on file  Relationships  . Social connections:    Talks on phone: Not on file    Gets together: Not on file    Attends religious service: Not on file    Active member of club or organization: Not on file    Attends meetings of clubs or organizations: Not on file    Relationship status: Not on file  . Intimate partner violence:    Fear of current or ex partner: Not on file    Emotionally abused: Not on file    Physically abused: Not on file    Forced sexual activity: Not on file  Other Topics Concern  . Not on file  Social History Narrative   Lives with her fiance. He has children.   Raised a niece after her  brother died, she is now adult.    Review of Systems: See HPI, otherwise negative ROS  Physical Exam: BP (!) 143/91   Pulse 74   Temp 98.3 F (36.8 C) (Oral)   Resp 13   Ht 5\' 7"  (1.702 m)   Wt 116.1 kg   SpO2 96%   BMI 40.10 kg/m  General:   Alert,  Well-developed,  well-nourished, pleasant and cooperative in NAD  Neck:  Supple; no masses or thyromegaly. No significant cervical adenopathy. Lungs:  Clear throughout to auscultation.   No wheezes, crackles, or rhonchi. No acute distress. Heart:  Regular rate and rhythm; no murmurs, clicks, rubs,  or gallops. Abdomen: Non-distended, normal bowel sounds.  Soft and nontender without appreciable mass or hepatosplenomegaly.  Pulses:  Normal pulses noted. Extremities:  Without clubbing or edema.  Impression/Plan: Positive DNA test.  Here for diagnostic colonoscopy per plan. The risks, benefits, limitations, alternatives and imponderables have been reviewed with the patient. Questions have been answered. All parties are agreeable.     Notice: This dictation was prepared with Dragon dictation along with smaller phrase technology. Any transcriptional errors that result from this process are unintentional and may not be corrected upon review.

## 2018-09-25 NOTE — Assessment & Plan Note (Signed)
Mildly elevated, may need to increase the hyzaar will see how BP is after her surgeries, she will monitor at home

## 2018-09-25 NOTE — Anesthesia Preprocedure Evaluation (Signed)
Anesthesia Evaluation  Patient identified by MRN, date of birth, ID band Patient awake    Reviewed: Allergy & Precautions, NPO status , Patient's Chart, lab work & pertinent test results  Airway Mallampati: III  TM Distance: >3 FB Neck ROM: Full    Dental no notable dental hx. (+) Teeth Intact   Pulmonary neg pulmonary ROS,    Pulmonary exam normal breath sounds clear to auscultation       Cardiovascular Exercise Tolerance: Good hypertension, Pt. on medications negative cardio ROS Normal cardiovascular examI Rhythm:Regular Rate:Normal     Neuro/Psych  Headaches, Anxiety Depression negative psych ROS   GI/Hepatic Neg liver ROS, GERD  Medicated and Controlled,  Endo/Other  diabetes, Well Controlled, Type 2Morbid obesity  Renal/GU negative Renal ROS  negative genitourinary   Musculoskeletal  (+) Arthritis , Osteoarthritis,    Abdominal   Peds negative pediatric ROS (+)  Hematology negative hematology ROS (+)   Anesthesia Other Findings   Reproductive/Obstetrics negative OB ROS                             Anesthesia Physical Anesthesia Plan  ASA: III  Anesthesia Plan: MAC   Post-op Pain Management:    Induction: Intravenous  PONV Risk Score and Plan:   Airway Management Planned: Nasal Cannula and Simple Face Mask  Additional Equipment:   Intra-op Plan:   Post-operative Plan:   Informed Consent: I have reviewed the patients History and Physical, chart, labs and discussed the procedure including the risks, benefits and alternatives for the proposed anesthesia with the patient or authorized representative who has indicated his/her understanding and acceptance.   Dental advisory given  Plan Discussed with: CRNA  Anesthesia Plan Comments:         Anesthesia Quick Evaluation

## 2018-09-25 NOTE — Op Note (Addendum)
[facility] Patient Name: Kelly [name] Procedure Date: 09/25/2018 10:38 AM MRN: [id number] Date of Birth: 1961/10/17 Attending MD: Norvel Richards , MD CSN: [id number] Age: 57 Admit Type: Outpatient Procedure:                Colonoscopy Indications:              Positive Cologuard test Providers:                Norvel Richards, MD, Lurline Del, RN, Gerome Sam, RN Referring MD:              Medicines:                Propofol per Anesthesia Complications:            No immediate complications. Estimated Blood Loss:     Estimated blood loss: none. Procedure:                Pre-Anesthesia Assessment:                           - Prior to the procedure, a History and Physical                            was performed, and patient medications and                            allergies were reviewed. The patient's tolerance of                            previous anesthesia was also reviewed. The risks                            and benefits of the procedure and the sedation                            options and risks were discussed with the patient.                            All questions were answered, and informed consent                            was obtained. Prior Anticoagulants: The patient has                            taken no previous anticoagulant or antiplatelet                            agents. ASA Grade Assessment: III - A patient with                            severe systemic disease. After reviewing the risks  and benefits, the patient was deemed in                            satisfactory condition to undergo the procedure.                           After obtaining informed consent, the colonoscope                            was passed under direct vision. Throughout the                            procedure, the patient's blood pressure, pulse, and                            oxygen saturations were  monitored continuously. The                            CF-[id number] ([id number]) scope was introduced through                            the and advanced to the the cecum, identified by                            appendiceal orifice and ileocecal valve. The                            colonoscopy was performed without difficulty. The                            patient tolerated the procedure well. The quality                            of the bowel preparation was adequate. Scope In: 11:00:13 AM Scope Out: 11:14:21 AM Scope Withdrawal Time: 0 hours 7 minutes 23 seconds  Total Procedure Duration: 0 hours 14 minutes 8 seconds  Findings:      Hemorrhoids were found on perianal exam.      Non-bleeding external and internal hemorrhoids were found during       retroflexion. The hemorrhoids were severe, large and Grade IV (internal       hemorrhoids that prolapse and cannot be reduced manually). Associated       large anal papilla also present.      A 15 mm polyp was found in the sigmoid colon. The polyp was       semi-pedunculated. The polyp was removed with a hot snare. Resection and       retrieval were complete. Estimated blood loss: none.      The exam was otherwise without abnormality on direct and retroflexion       views. Impression:               - Hemorrhoids found on perianal exam.                           - Non-bleeding external and internal hemorrhoids.                           -  One 15 mm polyp in the sigmoid colon, removed                            with a hot snare. Resected and retrieved.                           - The examination was otherwise normal on direct                            and retroflexion views. Patient has significant                            grade 4 hemorrhoid disease. Banding likely to be of                            no further benefit. May need surgical                            hemorrhoidectomy for definitive management. Moderate Sedation:      Moderate  (conscious) sedation was personally administered by an       anesthesia professional. The following parameters were monitored: oxygen       saturation, heart rate, blood pressure, respiratory rate, EKG, adequacy       of pulmonary ventilation, and response to care. Recommendation:           - Patient has a contact number available for                            emergencies. The signs and symptoms of potential                            delayed complications were discussed with the                            patient. Return to normal activities tomorrow.                            Written discharge instructions were provided to the                            patient.                           - Advance diet as tolerated.                           - Continue present medications.                           - Repeat colonoscopy date to be determined after                            pending pathology results are reviewed for  surveillance.                           - Return to GI office in 2 months. Procedure Code(s):        --- Professional ---                           [phone], Colonoscopy, flexible; with removal of                            tumor(s), polyp(s), or other lesion(s) by snare                            technique Diagnosis Code(s):        --- Professional ---                           K64.3, Fourth degree hemorrhoids                           D12.5, Benign neoplasm of sigmoid colon                           R19.5, Other fecal abnormalities CPT copyright 2018 American Medical Association. All rights reserved. The codes documented in this report are preliminary and upon coder review may  be revised to meet current compliance requirements. [name]. [name], MD Norvel Richards, MD 09/25/2018 11:30:56 AM This report has been signed electronically. Number of Addenda: 0

## 2018-09-26 ENCOUNTER — Encounter: Payer: Self-pay | Admitting: Internal Medicine

## 2018-10-01 ENCOUNTER — Encounter (HOSPITAL_COMMUNITY): Payer: Self-pay | Admitting: Internal Medicine

## 2018-10-03 ENCOUNTER — Telehealth: Payer: Self-pay | Admitting: Family Medicine

## 2018-10-03 MED ORDER — OXYCODONE-ACETAMINOPHEN 5-325 MG PO TABS: 1.0000 | ORAL_TABLET | Freq: Four times a day (QID) | ORAL | 0 refills | Status: DC | PRN

## 2018-10-03 MED FILL — OXYCODONE-ACETAMINOPHEN 5-3: 5-325 | 3 days supply | Qty: 10 | Fill #0

## 2018-10-03 NOTE — Telephone Encounter (Signed)
Pt with RA flares, had colonoscopy a few datys ago, has foot surgery next wed Trying to avoid steroids if possible Pain flared in her right leg, she ran out of her percocet, which has not been filled in > 6 months  Our office is closed today due to the holiday Will RX #10 of the Percocet  She can come in Monday if not improved

## 2018-10-06 ENCOUNTER — Encounter: Payer: Self-pay | Admitting: Podiatry

## 2018-10-06 ENCOUNTER — Ambulatory Visit: Payer: 59 | Admitting: Podiatry

## 2018-10-06 DIAGNOSIS — M21619 Bunion of unspecified foot: Secondary | ICD-10-CM

## 2018-10-06 DIAGNOSIS — M21612 Bunion of left foot: Secondary | ICD-10-CM

## 2018-10-06 MED ORDER — OXYCODONE-ACETAMINOPHEN 5-325 MG PO TABS: 1.0000 | ORAL_TABLET | Freq: Four times a day (QID) | ORAL | 0 refills | Status: DC | PRN

## 2018-10-06 MED ORDER — PROMETHAZINE HCL 25 MG PO TABS: 25.0000 mg | ORAL_TABLET | Freq: Three times a day (TID) | ORAL | 0 refills | Status: DC | PRN

## 2018-10-06 MED ORDER — CEPHALEXIN 500 MG PO CAPS: 500.0000 mg | ORAL_CAPSULE | Freq: Three times a day (TID) | ORAL | 0 refills | Status: DC

## 2018-10-06 MED FILL — PROMETHAZINE 25 MG TABLET: 25 | 7 days supply | Qty: 20 | Fill #0

## 2018-10-06 MED FILL — OXYCODONE-ACETAMINOPHEN 5-3: 5-325 | 3 days supply | Qty: 25 | Fill #0

## 2018-10-06 MED FILL — CEPHALEXIN 500 MG CAPSULE: 500 | 7 days supply | Qty: 21 | Fill #0

## 2018-10-06 NOTE — Progress Notes (Signed)
Office Visit Note  Patient: Kelly [name]             Date of Birth: 04-30-1961           MRN: [id number]             PCP: Alycia Rossetti, MD Referring: Alycia Rossetti, MD Visit Date: 10/20/2018 Occupation: @GUAROCC @  Subjective:  Left great toe pain s/p bunionectomy    History of Present Illness: Kelly [name] is a 57 y.o. female  with history of seropositive rheumatoid arthritis and osteoarthritis.  She is taking plaquenil 200 mg 1 tablet BID.  She has been tolerating PLQ without any side effects.  She has been taking natural anti-inflammatories.  She states she was having severe pain in the left groin the day before thanksgiving that lasted 2 days and resolved on its own.  She was having difficulty walking.  She took oxycodone for pain relief.  She denies any joint pain or joint swelling at this time.   She had a left bunionectomy performed by Dr. Earleen Newport on 10/08/18.  She has to wear a boot for 6 weeks.  She is following up with Dr. Earleen Newport on Friday to get her stitches removed.  She denies any redness, drainage, or signs of an infection.   Activities of Daily Living:  Patient reports morning stiffness for 0 minutes.   Patient Denies nocturnal pain.  Difficulty dressing/grooming: Denies Difficulty climbing stairs: Denies Difficulty getting out of chair: Denies Difficulty using hands for taps, buttons, cutlery, and/or writing: Denies  Review of Systems  Constitutional: Negative for fatigue.  HENT: Negative for mouth sores, mouth dryness and nose dryness.   Eyes: Negative for pain, visual disturbance and dryness.  Respiratory: Negative for cough, hemoptysis, shortness of breath and difficulty breathing.   Cardiovascular: Negative for chest pain, palpitations, hypertension and swelling in legs/feet.  Gastrointestinal: Negative for blood in stool, constipation and diarrhea.  Endocrine: Negative for increased urination.  Genitourinary: Negative for painful urination.    Musculoskeletal: Positive for arthralgias and joint pain. Negative for joint swelling, myalgias, muscle weakness, morning stiffness, muscle tenderness and myalgias.  Skin: Negative for color change, pallor, rash, hair loss, nodules/bumps, skin tightness, ulcers and sensitivity to sunlight.  Allergic/Immunologic: Negative for susceptible to infections.  Neurological: Negative for dizziness, numbness, headaches and weakness.  Hematological: Negative for swollen glands.  Psychiatric/Behavioral: Negative for depressed mood and sleep disturbance. The patient is not nervous/anxious.     PMFS History:  Patient Active Problem List   Diagnosis Date Noted  . Bunion 07/28/2018  . Rectal bleeding 06/17/2018  . Positive colorectal cancer screening using Cologuard test 06/17/2018  . GAD (generalized anxiety disorder) 02/19/2018  . Psoriasis 01/13/2018  . Rheumatoid arthritis involving multiple sites with positive rheumatoid factor ([place]) 11/22/2017  . Diabetes mellitus, type II ([place]) 11/04/2017  . Bronchial stenosis   . Essential hypertension 11/29/2015  . 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 Hemorrhoids, internal 03/04/2013  . External hemorrhoids with pain 03/04/2013  . Depression   . Seasonal allergies   . Irritable bowel syndrome with constipation >> diarrhea   . Headache disorder   . GERD (gastroesophageal reflux disease) 01/28/2013  . Chest pain 08/08/2012  . Hyperlipidemia 08/08/2012  . Morbid obesity ([place]) 08/08/2012    Past Medical History:  Diagnosis Date  . Allergy   . Anxiety   . Depression   . Diabetes mellitus without complication ([place])   . GERD (gastroesophageal reflux disease)   .  Headache disorder   . Hyperlipidemia   . Hypertension   . IBS (irritable bowel syndrome)   . Obesity   . Rheumatoid arthritis (HCC)    RA  . Salivary calculus   . Seasonal allergies     Family History  Problem Relation Age of Onset  . Heart attack Father        MI at  age 53  . Coronary artery disease Brother   . Diabetes Brother   . Coronary artery disease Brother   . Heart disease Brother   . Lung cancer Mother 73       +TOBACCO, had quit many years before diagnosis  . COPD Brother        ?Melanoma  . Lung cancer Other   . Hypertension Other   . Colon cancer Neg Hx    Past Surgical History:  Procedure Laterality Date  . ABDOMINAL HYSTERECTOMY  2001   partial hysterectomy  . BUNIONECTOMY Left 10/08/2018   Procedure: BUNIONECTOMY LEFT FOOT;  Surgeon: Trula Slade, DPM;  Location: [facility];  Service: Podiatry;  Laterality: Left;  . COLONOSCOPY WITH PROPOFOL N/A 09/25/2018   Procedure: COLONOSCOPY WITH PROPOFOL;  Surgeon: Daneil Dolin, MD;  [facility];  Service: Endoscopy;  Laterality: N/A;  12:00pm  . POLYPECTOMY  09/25/2018   Procedure: POLYPECTOMY;  Surgeon: Daneil Dolin, MD;  [facility];  Service: Endoscopy;;  (colon)  . REDUCTION MAMMAPLASTY Bilateral 04/25/2015  . SALIVARY GLAND SURGERY    . TEMPOROMANDIBULAR JOINT SURGERY     3 times   Social History   Social History Narrative   Lives with her fiance. He has children.   Raised a niece after her brother died, she is now adult.   Immunization History  Administered Date(s) Administered  . Influenza-Unspecified 09/04/2014, 07/06/2016, 07/22/2017  . Pneumococcal Polysaccharide-23 02/19/2018  . Tdap 09/05/2013    Objective: Vital Signs: BP (!) 141/92 (BP Location: Left Arm, Patient Position: Sitting, Cuff Size: Small)   Pulse 89   Resp 14   Ht 5' 7"  (1.702 m)   Wt 255 lb 6.4 oz (115.8 kg)   BMI 40.00 kg/m    Physical Exam Vitals signs and nursing note reviewed.  Constitutional:      Appearance: She is well-developed.  HENT:     Head: Normocephalic and atraumatic.  Eyes:     Conjunctiva/sclera: Conjunctivae normal.  Neck:     Musculoskeletal: Normal range of motion.  Cardiovascular:     Rate and Rhythm: Normal rate and  regular rhythm.     Heart sounds: Normal heart sounds.  Pulmonary:     Effort: Pulmonary effort is normal.     Breath sounds: Normal breath sounds.  Abdominal:     General: Bowel sounds are normal.     Palpations: Abdomen is soft.  Lymphadenopathy:     Cervical: No cervical adenopathy.  Skin:    General: Skin is warm and dry.     Capillary Refill: Capillary refill takes less than 2 seconds.  Neurological:     Mental Status: She is alert and oriented to person, place, and time.  Psychiatric:        Behavior: Behavior normal.      Musculoskeletal Exam: C-spine, thoracic spine, and lumbar spine good ROM.  Shoulder joints, elbow joints, wrist joints, MCPs, PIPs, and DIPs good ROM with no synovitis. Complete fist formation.  Hip joints, knee joints, ankle joints, MTPs, PIPs, and DIPs good ROM  with no synovitis.  No warmth or effusion of knee joints.  No tenderness or swelling of right ankle.  Left ankle in boot s/p left bunionectomy.   CDAI Exam: CDAI Score: 0  Patient Global Assessment: 0 (mm); Provider Global Assessment: 0 (mm) Swollen: 0 ; Tender: 0  Joint Exam   Not documented   There is currently no information documented on the homunculus. Go to the Rheumatology activity and complete the homunculus joint exam.  Investigation: No additional findings.  Imaging: Dg Foot Complete Left  Result Date: 10/13/2018 Please see detailed radiograph report in office note.   Recent Labs: Lab Results  Component Value Date   WBC 6.3 09/18/2018   HGB 12.9 09/18/2018   PLT 306 09/18/2018   NA 138 09/18/2018   K 3.3 (L) 09/18/2018   CL 104 09/18/2018   CO2 25 09/18/2018   GLUCOSE 101 (H) 09/18/2018   BUN 12 09/18/2018   CREATININE 0.73 09/18/2018   BILITOT 0.4 05/13/2018   ALKPHOS 91 02/10/2015   AST 15 05/13/2018   ALT 17 05/13/2018   PROT 6.6 05/13/2018   ALBUMIN 4.1 02/10/2015   CALCIUM 9.2 09/18/2018   GFRAA >60 09/18/2018   QFTBGOLDPLUS NEGATIVE 11/15/2017     Speciality Comments: PLQ Eye Exam: 12/24/17 WNL @ [facility] Follow up in 1 year  Procedures:  No procedures performed Allergies: Codeine; Topamax [topiramate]; and Maxalt [rizatriptan]   Assessment / Plan:     Visit Diagnoses: Rheumatoid arthritis involving multiple sites with positive rheumatoid factor (HCC) - RF+, CCP+, elevated ESR: She has no synovitis on exam.  She is clinically doing well on Plaquenil 200 mg BID.  She had an episode of 2-day left hip pain that resolved on its own in November 2019.  She has no joint pain, joint swelling, or morning stiffness currently.  She will continue on this current treatment regimen.  A refill of PLQ was sent to the pharmacy.  She will be moving to [place] in March 2020, and she was advised to notify us when and where she would like her records sent.  She was advised to notify us if she would like an office visit before she moves.  She will notify us if she develops increased joint pain or joint swelling.   High risk medication use -  PLQ 200 mg BID. She was hesitant to start MTX and SSZ.    Last Plaquenil eye exam normal on 12/24/2017.  Most recent CBC/BMP from hospital encounter within normal limits except low potassium on 09/18/2018.  X-ray of bilateral hands and feet taken on 11/15/17.  Primary osteoarthritis of both hands:  She has no synovitis or tenderness.  She has complete fist formation bilaterally.  Joint protection and muscle strengthening were discussed.  She is taking natural anti-inflammatories.   Unilateral primary osteoarthritis, right hip: She has good ROM with no discomfort at this time.   Primary osteoarthritis of both feet: She had a left bunionectomy on 10/08/18 performed by Dr. Earleen Newport.  She is wearing a boot for 6 weeks.  She is following up with Dr. Earleen Newport Friday to have sutures removed.  She has no drainage or signs of infection.    Other medical conditions are listed as follow:   History of hypertension  History  of gastroesophageal reflux (GERD)  History of diabetes mellitus  History of hyperlipidemia  History of IBS  History of renal cell carcinoma   Orders: No orders of the defined types were placed in this  encounter.  Meds ordered this encounter  Medications  . hydroxychloroquine (PLAQUENIL) 200 MG tablet    Sig: Take 1 tablet (200 mg total) by mouth 2 (two) times daily.    Dispense:  180 tablet    Refill:  0      Follow-Up Instructions: Return in about 5 months (around 03/21/2019) for Rheumatoid arthritis, Osteoarthritis.   Ofilia Neas, PA-C  Note - This record has been created using Dragon software.  Chart creation errors have been sought, but may not always  have been located. Such creation errors do not reflect on  the standard of medical care.

## 2018-10-06 NOTE — Patient Instructions (Signed)

## 2018-10-07 NOTE — Progress Notes (Signed)
Subjective: 57 year old female presents the office today to discuss surgery.  She is scheduled for an [facility] [place] on Wednesday.  She states that she is continued to have pain on the bunion site on a daily basis and she was going proceed with surgery.  She has attempted conservative treatment including shoe modifications, offloading padding without any significant improvement.  She has no further questions regarding the surgery. Denies any systemic complaints such as fevers, chills, nausea, vomiting. No acute changes since last appointment, and no other complaints at this time.   Objective: AAO x3, NAD DP/PT pulses palpable bilaterally, CRT less than 3 seconds Moderate bunion deformities present.  There is erythema on the medial ROM of the first metatarsal head on the bunion site and irritation from shoes.  There is no pain or crepitation with MPJ range of motion.  No first ray hypermobility is present.  No other areas of tenderness. No open lesions or pre-ulcerative lesions.  No pain with calf compression, swelling, warmth, erythema  Assessment: Symptomatic bunion deformity left foot  Plan: -All treatment options discussed with the patient including all alternatives, risks, complications.  -I again reviewed the x-rays with her and we discussed the surgery as well as postoperative course.  Discussed alternatives, risks, complications.  No promises or guarantees were given stuck in the procedure and she has no further questions or concerns today.  Surgical consent forms were signed.  Did review today. -Cam boot dispensed for postoperative use -Postop medications were sent to her pharmacy today -Patient encouraged to call the office with any questions, concerns, change in symptoms.   Trula Slade DPM

## 2018-10-08 ENCOUNTER — Ambulatory Visit (HOSPITAL_BASED_OUTPATIENT_CLINIC_OR_DEPARTMENT_OTHER): Payer: 59 | Admitting: Anesthesiology

## 2018-10-08 ENCOUNTER — Encounter (HOSPITAL_BASED_OUTPATIENT_CLINIC_OR_DEPARTMENT_OTHER)
Admission: RE | Disposition: A | Discharge status: Home / Self Care | Payer: Self-pay | Source: Ambulatory Visit | Attending: Podiatry

## 2018-10-08 ENCOUNTER — Other Ambulatory Visit: Payer: Self-pay

## 2018-10-08 ENCOUNTER — Ambulatory Visit (HOSPITAL_BASED_OUTPATIENT_CLINIC_OR_DEPARTMENT_OTHER)
Admission: RE | Admit: 2018-10-08 | Discharge: 2018-10-08 | Disposition: A | Discharge status: Home / Self Care | Payer: 59 | Source: Ambulatory Visit | Attending: Podiatry | Admitting: Podiatry

## 2018-10-08 ENCOUNTER — Encounter: Payer: Self-pay | Admitting: Podiatry

## 2018-10-08 ENCOUNTER — Encounter (HOSPITAL_BASED_OUTPATIENT_CLINIC_OR_DEPARTMENT_OTHER): Payer: Self-pay

## 2018-10-08 DIAGNOSIS — R51 Headache: Secondary | ICD-10-CM | POA: Insufficient documentation

## 2018-10-08 DIAGNOSIS — F329 Major depressive disorder, single episode, unspecified: Secondary | ICD-10-CM | POA: Diagnosis not present

## 2018-10-08 DIAGNOSIS — K219 Gastro-esophageal reflux disease without esophagitis: Secondary | ICD-10-CM | POA: Insufficient documentation

## 2018-10-08 DIAGNOSIS — F419 Anxiety disorder, unspecified: Secondary | ICD-10-CM | POA: Diagnosis not present

## 2018-10-08 DIAGNOSIS — G8918 Other acute postprocedural pain: Secondary | ICD-10-CM | POA: Diagnosis not present

## 2018-10-08 DIAGNOSIS — M069 Rheumatoid arthritis, unspecified: Secondary | ICD-10-CM | POA: Diagnosis not present

## 2018-10-08 DIAGNOSIS — M2012 Hallux valgus (acquired), left foot: Secondary | ICD-10-CM | POA: Diagnosis not present

## 2018-10-08 DIAGNOSIS — I10 Essential (primary) hypertension: Secondary | ICD-10-CM | POA: Diagnosis not present

## 2018-10-08 DIAGNOSIS — E119 Type 2 diabetes mellitus without complications: Secondary | ICD-10-CM | POA: Diagnosis not present

## 2018-10-08 DIAGNOSIS — Z6839 Body mass index (BMI) 39.0-39.9, adult: Secondary | ICD-10-CM | POA: Diagnosis not present

## 2018-10-08 HISTORY — PX: BUNIONECTOMY: SHX129

## 2018-10-08 LAB — GLUCOSE, CAPILLARY
GLUCOSE-CAPILLARY: 108 mg/dL — AB (ref 70–99)
GLUCOSE-CAPILLARY: 147 mg/dL — AB (ref 70–99)

## 2018-10-08 SURGERY — BUNIONECTOMY
Anesthesia: General | Site: Foot | Laterality: Left

## 2018-10-08 MED ORDER — SCOPOLAMINE 1 MG/3DAYS TD PT72: 1.0000 | MEDICATED_PATCH | Freq: Once | TRANSDERMAL | Status: DC | PRN

## 2018-10-08 MED ORDER — LACTATED RINGERS IV SOLN
INTRAVENOUS | Status: DC
  Administered 2018-10-08: 13:00:00 via INTRAVENOUS

## 2018-10-08 MED ORDER — MIDAZOLAM HCL 2 MG/2ML IJ SOLN
INTRAMUSCULAR | Status: AC
  Filled 2018-10-08: qty 2

## 2018-10-08 MED ORDER — PROPOFOL 10 MG/ML IV BOLUS
INTRAVENOUS | Status: AC
  Filled 2018-10-08: qty 20

## 2018-10-08 MED ORDER — ONDANSETRON HCL 4 MG/2ML IJ SOLN
INTRAMUSCULAR | Status: AC
  Filled 2018-10-08: qty 2

## 2018-10-08 MED ORDER — DEXAMETHASONE SODIUM PHOSPHATE 10 MG/ML IJ SOLN
INTRAMUSCULAR | Status: AC
  Filled 2018-10-08: qty 1

## 2018-10-08 MED ORDER — CHLORHEXIDINE GLUCONATE CLOTH 2 % EX PADS: 6.0000 | MEDICATED_PAD | Freq: Once | CUTANEOUS | Status: DC

## 2018-10-08 MED ORDER — DEXAMETHASONE SODIUM PHOSPHATE 10 MG/ML IJ SOLN
INTRAMUSCULAR | Status: DC | PRN
  Administered 2018-10-08: 5 mg via INTRAVENOUS

## 2018-10-08 MED ORDER — BUPIVACAINE-EPINEPHRINE (PF) 0.5% -1:200000 IJ SOLN
INTRAMUSCULAR | Status: DC | PRN
  Administered 2018-10-08: 10 mL via PERINEURAL
  Administered 2018-10-08: 30 mL via PERINEURAL

## 2018-10-08 MED ORDER — CEFAZOLIN SODIUM-DEXTROSE 2-4 GM/100ML-% IV SOLN
INTRAVENOUS | Status: AC
  Filled 2018-10-08: qty 100

## 2018-10-08 MED ORDER — BUPIVACAINE HCL (PF) 0.5 % IJ SOLN
INTRAMUSCULAR | Status: AC
  Filled 2018-10-08: qty 30

## 2018-10-08 MED ORDER — CEFAZOLIN SODIUM-DEXTROSE 2-4 GM/100ML-% IV SOLN
2.0000 g | INTRAVENOUS | Status: AC
  Administered 2018-10-08: 2 g via INTRAVENOUS

## 2018-10-08 MED ORDER — LIDOCAINE 2% (20 MG/ML) 5 ML SYRINGE
INTRAMUSCULAR | Status: AC
  Filled 2018-10-08: qty 5

## 2018-10-08 MED ORDER — PROPOFOL 10 MG/ML IV BOLUS
INTRAVENOUS | Status: DC | PRN
  Administered 2018-10-08: 200 mg via INTRAVENOUS

## 2018-10-08 MED ORDER — MIDAZOLAM HCL 2 MG/2ML IJ SOLN
1.0000 mg | INTRAMUSCULAR | Status: DC | PRN
  Administered 2018-10-08: 2 mg via INTRAVENOUS

## 2018-10-08 MED ORDER — FENTANYL CITRATE (PF) 100 MCG/2ML IJ SOLN
50.0000 ug | INTRAMUSCULAR | Status: DC | PRN
  Administered 2018-10-08: 50 ug via INTRAVENOUS

## 2018-10-08 MED ORDER — FENTANYL CITRATE (PF) 100 MCG/2ML IJ SOLN
INTRAMUSCULAR | Status: DC | PRN
  Administered 2018-10-08: 50 ug via INTRAVENOUS
  Administered 2018-10-08 (×2): 25 ug via INTRAVENOUS

## 2018-10-08 MED ORDER — FENTANYL CITRATE (PF) 100 MCG/2ML IJ SOLN
INTRAMUSCULAR | Status: AC
  Filled 2018-10-08: qty 2

## 2018-10-08 MED ORDER — LIDOCAINE HCL 2 % IJ SOLN
INTRAMUSCULAR | Status: AC
  Filled 2018-10-08: qty 20

## 2018-10-08 MED ORDER — ONDANSETRON HCL 4 MG/2ML IJ SOLN
INTRAMUSCULAR | Status: DC | PRN
  Administered 2018-10-08: 4 mg via INTRAVENOUS

## 2018-10-08 MED ORDER — LIDOCAINE 2% (20 MG/ML) 5 ML SYRINGE
INTRAMUSCULAR | Status: DC | PRN
  Administered 2018-10-08: 60 mg via INTRAVENOUS

## 2018-10-08 SURGICAL SUPPLY — 52 items
BANDAGE ACE 3X5.8 VEL STRL LF (GAUZE/BANDAGES/DRESSINGS) ×3 IMPLANT
BLADE AVERAGE 25MMX9MM (BLADE) ×1
BLADE AVERAGE 25X9 (BLADE) ×2 IMPLANT
BLADE SURG 15 STRL LF DISP TIS (BLADE) ×2 IMPLANT
BLADE SURG 15 STRL SS (BLADE) ×6
BNDG CMPR 9X4 STRL LF SNTH (GAUZE/BANDAGES/DRESSINGS) ×1
BNDG CONFORM 2 STRL LF (GAUZE/BANDAGES/DRESSINGS) ×3 IMPLANT
BNDG ESMARK 4X9 LF (GAUZE/BANDAGES/DRESSINGS) ×3 IMPLANT
BNDG GAUZE ELAST 4 BULKY (GAUZE/BANDAGES/DRESSINGS) ×1 IMPLANT
COVER BACK TABLE 60X90IN (DRAPES) ×3 IMPLANT
COVER WAND RF STERILE (DRAPES) IMPLANT
CUFF TOURNIQUET SINGLE 18IN (TOURNIQUET CUFF) ×2 IMPLANT
DRAPE EXTREMITY T 121X128X90 (DRAPE) ×3 IMPLANT
DRAPE IMP U-DRAPE 54X76 (DRAPES) ×3 IMPLANT
DRAPE OEC MINIVIEW 54X84 (DRAPES) IMPLANT
DRSG EMULSION OIL 3X3 NADH (GAUZE/BANDAGES/DRESSINGS) ×3 IMPLANT
DURAPREP 26ML APPLICATOR (WOUND CARE) ×3 IMPLANT
ELECT REM PT RETURN 9FT ADLT (ELECTROSURGICAL) ×3
ELECTRODE REM PT RTRN 9FT ADLT (ELECTROSURGICAL) ×1 IMPLANT
GAUZE 4X4 16PLY RFD (DISPOSABLE) IMPLANT
GAUZE SPONGE 4X4 12PLY STRL (GAUZE/BANDAGES/DRESSINGS) ×3 IMPLANT
GLOVE BIO SURGEON STRL SZ7.5 (GLOVE) ×6 IMPLANT
GLOVE BIOGEL PI IND STRL 7.0 (GLOVE) IMPLANT
GLOVE BIOGEL PI INDICATOR 7.0 (GLOVE) ×4
GLOVE ECLIPSE 6.5 STRL STRAW (GLOVE) ×2 IMPLANT
GOWN STRL REUS W/ TWL LRG LVL3 (GOWN DISPOSABLE) ×1 IMPLANT
GOWN STRL REUS W/ TWL XL LVL3 (GOWN DISPOSABLE) ×1 IMPLANT
GOWN STRL REUS W/TWL LRG LVL3 (GOWN DISPOSABLE) ×3
GOWN STRL REUS W/TWL XL LVL3 (GOWN DISPOSABLE) ×3
IMPL SCREW 3.0X18 (Screw) IMPLANT
IMPLANT SCREW 3.0X18 (Screw) ×3 IMPLANT
K-WIRE FIXATION CANN 1.1X120 (WIRE) ×6
KWIRE FIXATION CANN 1.1X120 (WIRE) IMPLANT
NDL HYPO 25X1 1.5 SAFETY (NEEDLE) ×1 IMPLANT
NDL SAFETY ECLIPSE 18X1.5 (NEEDLE) IMPLANT
NEEDLE HYPO 18GX1.5 SHARP (NEEDLE)
NEEDLE HYPO 25X1 1.5 SAFETY (NEEDLE) IMPLANT
NS IRRIG 1000ML POUR BTL (IV SOLUTION) ×2 IMPLANT
PACK BASIN DAY SURGERY FS (CUSTOM PROCEDURE TRAY) ×3 IMPLANT
PADDING CAST ABS 4INX4YD NS (CAST SUPPLIES) ×2
PADDING CAST ABS COTTON 4X4 ST (CAST SUPPLIES) ×1 IMPLANT
PENCIL BUTTON HOLSTER BLD 10FT (ELECTRODE) ×3 IMPLANT
STOCKINETTE 6  STRL (DRAPES) ×2
STOCKINETTE 6 STRL (DRAPES) ×1 IMPLANT
STRIP SUTURE WOUND CLOSURE 1/2 (SUTURE) IMPLANT
SUT MERSILENE 2.0 SH NDLE (SUTURE) ×3 IMPLANT
SUT MNCRL AB 3-0 PS2 18 (SUTURE) ×3 IMPLANT
SUT MNCRL AB 4-0 PS2 18 (SUTURE) ×2 IMPLANT
SUT MON AB 5-0 PS2 18 (SUTURE) ×3 IMPLANT
SYR 10ML LL (SYRINGE) IMPLANT
SYR BULB 3OZ (MISCELLANEOUS) ×3 IMPLANT
UNDERPAD 30X30 (UNDERPADS AND DIAPERS) ×3 IMPLANT

## 2018-10-08 NOTE — Anesthesia Procedure Notes (Signed)
Anesthesia Regional Block: Popliteal block   Pre-Anesthetic Checklist: ,, timeout performed, Correct Patient, Correct Site, Correct Laterality, Correct Procedure, Correct Position, site marked, Risks and benefits discussed,  Surgical consent,  Pre-op evaluation,  At surgeon's request and post-op pain management  Laterality: Lower and Left  Prep: chloraprep       Needles:  Injection technique: Single-shot  Needle Type: Echogenic Stimulator Needle     Needle Length: 9cm  Needle Gauge: 21     Additional Needles:   Procedures:,,,, ultrasound used (permanent image in chart),,,,  Narrative:  Start time: 10/08/2018 12:46 PM End time: 10/08/2018 12:51 PM Injection made incrementally with aspirations every 5 mL.  Performed by: Personally  Anesthesiologist: Lyn Hollingshead, MD  Additional Notes: No pain on injection. No increased resistance to injection. Injection made in 5cc increments.  Good needle visualization.  Patient tolerated procedure well.

## 2018-10-08 NOTE — Discharge Instructions (Signed)
Regional Anesthesia Blocks  1. Numbness or the inability to move the "blocked" extremity may last from 3-48 hours after placement. The length of time depends on the medication injected and your individual response to the medication. If the numbness is not going away after 48 hours, call your surgeon.  2. The extremity that is blocked will need to be protected until the numbness is gone and the  Strength has returned. Because you cannot feel it, you will need to take extra care to avoid injury. Because it may be weak, you may have difficulty moving it or using it. You may not know what position it is in without looking at it while the block is in effect.  3. For blocks in the legs and feet, returning to weight bearing and walking needs to be done carefully. You will need to wait until the numbness is entirely gone and the strength has returned. You should be able to move your leg and foot normally before you try and bear weight or walk. You will need someone to be with you when you first try to ensure you do not fall and possibly risk injury.  4. Bruising and tenderness at the needle site are common side effects and will resolve in a few days.  5. Persistent numbness or new problems with movement should be communicated to the surgeon or the [facility] [phone] [place] [phone]).Resume all home medications as you were taking before surgery.   ---------------   Post Anesthesia Home Care Instructions  Activity: Get plenty of rest for the remainder of the day. A responsible individual must stay with you for 24 hours following the procedure.  For the next 24 hours, DO NOT: -Drive a car -Paediatric nurse -Drink alcoholic beverages -Take any medication unless instructed by your physician -Make any legal decisions or sign important papers.  Meals: Start with liquid foods such as gelatin or soup. Progress to regular foods as tolerated. Avoid greasy, spicy,  heavy foods. If nausea and/or vomiting occur, drink only clear liquids until the nausea and/or vomiting subsides. Call your physician if vomiting continues.  Special Instructions/Symptoms: Your throat may feel dry or sore from the anesthesia or the breathing tube placed in your throat during surgery. If this causes discomfort, gargle with warm salt water. The discomfort should disappear within 24 hours.  If you had a scopolamine patch placed behind your ear for the management of post- operative nausea and/or vomiting:  1. The medication in the patch is effective for 72 hours, after which it should be removed.  Wrap patch in a tissue and discard in the trash. Wash hands thoroughly with soap and water. 2. You may remove the patch earlier than 72 hours if you experience unpleasant side effects which may include dry mouth, dizziness or visual disturbances. 3. Avoid touching the patch. Wash your hands with soap and water after contact with the patch.

## 2018-10-08 NOTE — Anesthesia Procedure Notes (Signed)
Anesthesia Regional Block: Adductor canal block   Pre-Anesthetic Checklist: ,, timeout performed, Correct Patient, Correct Site, Correct Laterality, Correct Procedure, Correct Position, site marked, Risks and benefits discussed,  Surgical consent,  Pre-op evaluation,  At surgeon's request and post-op pain management  Laterality: Lower and Left  Prep: chloraprep       Needles:  Injection technique: Single-shot  Needle Type: Echogenic Stimulator Needle     Needle Length: 9cm  Needle Gauge: 21     Additional Needles:   Procedures:,,,, ultrasound used (permanent image in chart),,,,  Narrative:  Start time: 10/08/2018 12:51 PM End time: 10/08/2018 12:55 PM Injection made incrementally with aspirations every 5 mL.  Performed by: Personally  Anesthesiologist: Lyn Hollingshead, MD  Additional Notes: No pain on injection. No increased resistance to injection. Injection made in 5cc increments.  Good needle visualization.  Patient tolerated procedure well.

## 2018-10-08 NOTE — Progress Notes (Signed)
Assisted Dr. Gifford Shave with left, ultrasound guided, popliteal/saphenous, adductor canal block. Side rails up, monitors on throughout procedure. See vital signs in flow sheet. Tolerated Procedure well.

## 2018-10-08 NOTE — Anesthesia Postprocedure Evaluation (Signed)
Anesthesia Post Note  Patient: Anabia A Kondracki  Procedure(s) Performed: BUNIONECTOMY LEFT FOOT (Left Foot)     Patient location during evaluation: PACU Anesthesia Type: General Level of consciousness: awake and alert, awake and oriented Pain management: pain level controlled Vital Signs Assessment: post-procedure vital signs reviewed and stable Respiratory status: spontaneous breathing, nonlabored ventilation and respiratory function stable Cardiovascular status: blood pressure returned to baseline and stable Postop Assessment: no apparent nausea or vomiting Anesthetic complications: no    Last Vitals:  Vitals:   10/08/18 1423 10/08/18 1500  BP: 125/74 129/85  Pulse: (!) 109 82  Resp: 15 18  Temp: 36.8 C (!) 36.2 C  SpO2: 96% 97%    Last Pain:  Vitals:   10/08/18 1515  TempSrc:   PainSc: 0-No pain    LLE Motor Response: No movement due to regional block (10/08/18 1515) LLE Sensation: Decreased(due to block) (10/08/18 1515)          Catalina Gravel

## 2018-10-08 NOTE — Transfer of Care (Signed)
Immediate Anesthesia Transfer of Care Note  Patient: Kelly [name]  Procedure(s) Performed: BUNIONECTOMY LEFT FOOT (Left Foot)  Patient Location: PACU  Anesthesia Type:GA combined with regional for post-op pain  Level of Consciousness: awake, alert  and oriented  Airway & Oxygen Therapy: Patient Spontanous Breathing and Patient connected to face mask oxygen  Post-op Assessment: Report given to RN and Post -op Vital signs reviewed and stable  Post vital signs: Reviewed and stable  Last Vitals:  Vitals Value Taken Time  BP 125/74 10/08/2018  2:24 PM  Temp    Pulse 105 10/08/2018  2:25 PM  Resp 17 10/08/2018  2:25 PM  SpO2 98 % 10/08/2018  2:25 PM  Vitals shown include unvalidated device data.  Last Pain:  Vitals:   10/08/18 1207  TempSrc: Oral  PainSc: 0-No pain         Complications: No apparent anesthesia complications

## 2018-10-08 NOTE — Anesthesia Procedure Notes (Signed)
Procedure Name: LMA Insertion Date/Time: 10/08/2018 1:24 PM Performed by: Genelle Bal, CRNA Pre-anesthesia Checklist: Patient identified, Emergency Drugs available, Suction available and Patient being monitored Patient Re-evaluated:Patient Re-evaluated prior to induction Oxygen Delivery Method: Circle system utilized Preoxygenation: Pre-oxygenation with 100% oxygen Induction Type: IV induction Ventilation: Mask ventilation without difficulty LMA: LMA inserted LMA Size: 4.0 Number of attempts: 1 Airway Equipment and Method: Bite block Placement Confirmation: positive ETCO2 Tube secured with: Tape Dental Injury: Teeth and Oropharynx as per pre-operative assessment

## 2018-10-08 NOTE — Brief Op Note (Signed)
10/08/2018  2:18 PM  PATIENT:  Kelly [name]  57 y.o. female  PRE-OPERATIVE DIAGNOSIS:  HALLUX ABDUCTO VALGUS LEFT FOOT  POST-OPERATIVE DIAGNOSIS:  HALLUX ABDUCTO VALGUS LEFT FOOT  PROCEDURE:  Procedure(s): BUNIONECTOMY LEFT FOOT (Left)  SURGEON:  Surgeon(s) and Role:    * Trula Slade, DPM - Primary  PHYSICIAN ASSISTANT:   ASSISTANTS: none   ANESTHESIA:   general  EBL:  1 mL   BLOOD ADMINISTERED:none  DRAINS: none   LOCAL MEDICATIONS USED:  NONE  SPECIMEN:  No Specimen  DISPOSITION OF SPECIMEN:  N/A  COUNTS:  YES  TOURNIQUET:   Total Tourniquet Time Documented: Calf (Left) - 36 minutes Total: Calf (Left) - 36 minutes   DICTATION: .Viviann Spare Dictation  PLAN OF CARE: Discharge to home after PACU  PATIENT DISPOSITION:  PACU - hemodynamically stable.   Delay start of Pharmacological VTE agent (>24hrs) due to surgical blood loss or risk of bleeding: no

## 2018-10-08 NOTE — H&P (Signed)
  Subjective: Ms. Sievers presents to [facility] center for surgical correction of a painful bunion on her LEFT foot. This has been ongoing and despite multiple conservative treatments she continues to have pain and she is requesting surgical intervention. He only question today is in regards to how often to wear the CAM boot. Denies any systemic complaints such as fevers, chills, nausea, vomiting. No acute changes since last appointment, and no other complaints at this time.   Objective: AAO x3, NAD DP/PT pulses palpable bilaterally, CRT less than 3 seconds Moderate bunion deformity is present on the left foot with localized erythema for irritation. There is no open sores, increase in warmth, drainage, or other signs of infection. No other areas of tenderness. Exam is unchanged.   No open lesions or pre-ulcerative lesions.  No pain with calf compression, swelling, warmth, erythema  Assessment: Symptomatic bunion deformity, left  Plan: -All treatment options discussed with the patient including all alternatives, risks, complications.  -Again reviewed the surgery and postop course. All alternatives, risks, complications discussed. No promises or guarantees given as to the outcome of the surgery. Surgical consent form was signed.  -Postop instructions reviewed, including to wear the CAM boot at all times. She has picked up her postop medications. -NPO -Her sister is in the waiting room with her nephew and she has given me permission to talk to them postop. -Full H&P from her PCP in chart.  -Will plan as scheduled for surgery   Celesta Gentile, DPM O: [phone] C: [phone]

## 2018-10-08 NOTE — Anesthesia Preprocedure Evaluation (Signed)
Anesthesia Evaluation  Patient identified by MRN, date of birth, ID band Patient awake    Reviewed: Allergy & Precautions, NPO status , Patient's Chart, lab work & pertinent test results  Airway Mallampati: II  TM Distance: >3 FB Neck ROM: Full    Dental  (+) Teeth Intact, Dental Advisory Given   Pulmonary neg pulmonary ROS,    Pulmonary exam normal breath sounds clear to auscultation       Cardiovascular hypertension, Pt. on medications Normal cardiovascular exam Rhythm:Regular Rate:Normal     Neuro/Psych  Headaches, PSYCHIATRIC DISORDERS Anxiety Depression    GI/Hepatic Neg liver ROS, GERD  Medicated,  Endo/Other  diabetes, Type 2, Oral Hypoglycemic AgentsMorbid obesity  Renal/GU negative Renal ROS     Musculoskeletal  (+) Arthritis , Rheumatoid disorders,  HALLUX ABDUCTO VALGUS LEFT FOOT   Abdominal   Peds  Hematology negative hematology ROS (+)   Anesthesia Other Findings Day of surgery medications reviewed with the patient.  Reproductive/Obstetrics                             Anesthesia Physical Anesthesia Plan  ASA: III  Anesthesia Plan: General   Post-op Pain Management:    Induction: Intravenous  PONV Risk Score and Plan: 3 and Midazolam, Dexamethasone and Ondansetron  Airway Management Planned: LMA  Additional Equipment:   Intra-op Plan:   Post-operative Plan: Extubation in OR  Informed Consent: I have reviewed the patients History and Physical, chart, labs and discussed the procedure including the risks, benefits and alternatives for the proposed anesthesia with the patient or authorized representative who has indicated his/her understanding and acceptance.   Dental advisory given  Plan Discussed with: CRNA  Anesthesia Plan Comments:         Anesthesia Quick Evaluation

## 2018-10-09 ENCOUNTER — Telehealth: Payer: Self-pay | Admitting: *Deleted

## 2018-10-09 NOTE — Telephone Encounter (Signed)
Patient called me back and stated that she was having no pain at all and the numbness was still there and no fever or chills or nausea and I stated to call if any concerns or questions. Kelly [name]

## 2018-10-09 NOTE — Telephone Encounter (Signed)
Called and left a message for the patient stating I was calling to check on her after having surgery on Wednesday with Dr Jacqualyn Posey and to call the office if any concerns or questions [phone]. Kelly [name]

## 2018-10-10 ENCOUNTER — Telehealth: Payer: Self-pay | Admitting: Podiatry

## 2018-10-10 ENCOUNTER — Encounter (HOSPITAL_BASED_OUTPATIENT_CLINIC_OR_DEPARTMENT_OTHER): Payer: Self-pay | Admitting: Podiatry

## 2018-10-10 NOTE — Telephone Encounter (Signed)
Called and spoke with the patient and patient wanted to know when could she walk and for how long and I stated that no more than 15 minutes and was using a walker and that you could take the boot off and put it back on when getting up and we would see patient on Monday. Lattie Haw

## 2018-10-10 NOTE — Telephone Encounter (Signed)
Please call patient she is trying to figure out when she should start walking. Requested to speak to [place].

## 2018-10-10 NOTE — Op Note (Signed)
PATIENT:  Kelly [name]  57 y.o. female  PRE-OPERATIVE DIAGNOSIS:  HALLUX ABDUCTO VALGUS LEFT FOOT  POST-OPERATIVE DIAGNOSIS:  HALLUX ABDUCTO VALGUS LEFT FOOT  PROCEDURE:  Procedure(s): BUNIONECTOMY LEFT FOOT (Left)  SURGEON:  Surgeon(s) and Role:    * Trula Slade, DPM - Primary  PHYSICIAN ASSISTANT:   ASSISTANTS: none   ANESTHESIA:   general  EBL:  1 mL   BLOOD ADMINISTERED:none  DRAINS: none   LOCAL MEDICATIONS USED:  NONE  SPECIMEN:  No Specimen  DISPOSITION OF SPECIMEN:  N/A  COUNTS:  YES  TOURNIQUET:   Total Tourniquet Time Documented: Calf (Left) - 36 minutes Total: Calf (Left) - 36 minutes   DICTATION: .Viviann Spare Dictation  PLAN OF CARE: Discharge to home after PACU  PATIENT DISPOSITION:  PACU - hemodynamically stable.   Delay start of Pharmacological VTE agent (>24hrs) due to surgical blood loss or risk of bleeding: no  Indications for surgery: Kelly [name] presented to the office with concerns of pain on her left bunion.  She is attempted numerous conservative treatments that any significant improvement at this time she is requesting surgical intervention.  We discussed an Austin bunionectomy with screw fixation.  We discussed all alternatives, risks, complications.  No promises or guarantees were given injection of the procedure and all questions were answered discussed my ability.  Procedure in detail: The patient was both verbally and visually identified by myself and nursing staff in the anesthesia staff in the preoperative holding area.  A nerve block was performed with anesthesia staff in the preoperative holding area.  She was then brought back to the operating room and placed on the operating table in supine position.  A well-padded pneumatic ankle tourniquet was placed in the left lower extremity.  After an adequate plane of anesthesia was obtained the left lower extremities and scrubbed prepped and draped in the normal sterile  fashion.   Timeout was performed.  The left lower extremity was exsanguinated with an Esmarch bandage and the pneumatic ankle tourniquet was inflated to 250 mmHg  An incision was made along the distal portion of the first metatarsal extending to the first metatarsal phalangeal joint medial to the extensor tendon.  Incision was made with a 15 blade scalpel through the epidermis and the dermis.  The subcutaneous tissues were then bluntly and sharply dissected making sure to retract all vital neurovascular structures all bleeders were cauterized as necessary.  At this time an incision was then carried down along the first interspace where blunt dissection was then carried down to the deep transverse intermetatarsal ligament.  This was then transected with a Metzenbaum scissor.  Next the abductor tendon as well as the fibular sesamoidal tendon was then incised and is found to be increased range of motion of the first MPJ.  Attention was then directed along the dorsal medial aspect of the first metatarsal and was a capsular incision was then made.  Soft tissue structures were freed from the distal portion of the first metatarsal head.  Sagittal bone saw was utilized to resect the medial eminence of the first metatarsal head.  This all was then utilized to create an osteotomy for an [employer].  Osteotomes were utilized to finish the osteotomy and the capital fragment was distracted and shifted laterally into a corrected position.  Guidewire was then placed for Integra 4.0 millimeters screw as well as a secondary wire for temporary fixation.  Fluoroscopy was utilized to confirm placement.  After the appropriate position was obtained  and Integra 4.0 millimeter screw was inserted over the wire to adequate compression.  The guidewires were then removed.  Overhanging medial bone was then excised and was smooth.  At this time there is to be adequate reduction of the deformity.  The incision was then closely  irrigated with sterile saline hemostasis was achieved.  The incision was then closed in layered fashion the capsule closed with 3-0 Monocryl the subcutaneous tissues with 4-0 Monocryl and the skin was then closed with 5-0 Monocryl in a running subcuticular fashion.  Betadine was painted over the incision followed by Xeroform and dry sterile dressing.  The tourniquet was released and was found to be an immediate capillary refill time to all the digits.  She is awoke from anesthesia and found to tolerate the procedure well without any complications and she was transferred to the PACU vital signs stable and vascular status intact.

## 2018-10-13 ENCOUNTER — Ambulatory Visit (INDEPENDENT_AMBULATORY_CARE_PROVIDER_SITE_OTHER): Payer: Self-pay | Admitting: Podiatry

## 2018-10-13 ENCOUNTER — Ambulatory Visit (INDEPENDENT_AMBULATORY_CARE_PROVIDER_SITE_OTHER): Payer: 59

## 2018-10-13 DIAGNOSIS — M21612 Bunion of left foot: Secondary | ICD-10-CM | POA: Diagnosis not present

## 2018-10-13 DIAGNOSIS — M21619 Bunion of unspecified foot: Secondary | ICD-10-CM

## 2018-10-16 NOTE — Progress Notes (Signed)
Subjective: Kelly [name] is a 58 y.o. is seen today in office s/p left foot Austin bunionectomy preformed on 10/08/2018.  She said the pain is controlled.  She is only taken a couple of the pain medication [facility] and is only taking it at nighttime.  She is remain in the cam boot.  She said that she is doing well and she has no concerns.  Denies any systemic complaints such as fevers, chills, nausea, vomiting. No calf pain, chest pain, shortness of breath.   Objective: General: No acute distress, AAOx3  DP/PT pulses palpable 2/4, CRT < 3 sec to all digits.  Protective sensation intact. Motor function intact.  LEFT foot: Incision is well coapted without any evidence of dehiscence with sutures intact. There is no surrounding erythema, ascending cellulitis, fluctuance, crepitus, malodor, drainage/purulence. There is mild edema around the surgical site. There is no significant pain along the surgical site.  Mild ecchymosis No other areas of tenderness to bilateral lower extremities.  No other open lesions or pre-ulcerative lesions.  No pain with calf compression, swelling, warmth, erythema.   Assessment and Plan:  Status post left foot Austin bunionectomy, doing well with no complications   -Treatment options discussed including all alternatives, risks, and complications -X-rays were obtained and reviewed.  Status post bunionectomy with screw fixation without any complicating factors. -Antibiotic ointment was applied followed by dry sterile dressing.  Keep the dressing clean, dry, intact. -Continue cam boot and limit activity -Ice/elevation -Pain medication as needed. -Monitor for any clinical signs or symptoms of infection and DVT/PE and directed to call the office immediately should any occur or go to the ER. -Follow-up in 10 days or sooner if any problems arise. In the meantime, encouraged to call the office with any questions, concerns, change in symptoms.   Celesta Gentile, DPM

## 2018-10-20 ENCOUNTER — Ambulatory Visit: Payer: 59 | Admitting: Physician Assistant

## 2018-10-20 ENCOUNTER — Encounter: Payer: Self-pay | Admitting: Physician Assistant

## 2018-10-20 VITALS — BP 141/92 | HR 89 | Resp 14 | Ht 67.0 in | Wt 255.4 lb

## 2018-10-20 DIAGNOSIS — M19071 Primary osteoarthritis, right ankle and foot: Secondary | ICD-10-CM

## 2018-10-20 DIAGNOSIS — Z79899 Other long term (current) drug therapy: Secondary | ICD-10-CM | POA: Diagnosis not present

## 2018-10-20 DIAGNOSIS — M1611 Unilateral primary osteoarthritis, right hip: Secondary | ICD-10-CM | POA: Diagnosis not present

## 2018-10-20 DIAGNOSIS — Z8639 Personal history of other endocrine, nutritional and metabolic disease: Secondary | ICD-10-CM

## 2018-10-20 DIAGNOSIS — M19042 Primary osteoarthritis, left hand: Secondary | ICD-10-CM

## 2018-10-20 DIAGNOSIS — M19072 Primary osteoarthritis, left ankle and foot: Secondary | ICD-10-CM

## 2018-10-20 DIAGNOSIS — Z8679 Personal history of other diseases of the circulatory system: Secondary | ICD-10-CM

## 2018-10-20 DIAGNOSIS — M0579 Rheumatoid arthritis with rheumatoid factor of multiple sites without organ or systems involvement: Secondary | ICD-10-CM | POA: Diagnosis not present

## 2018-10-20 DIAGNOSIS — Z8719 Personal history of other diseases of the digestive system: Secondary | ICD-10-CM

## 2018-10-20 DIAGNOSIS — Z85528 Personal history of other malignant neoplasm of kidney: Secondary | ICD-10-CM | POA: Diagnosis not present

## 2018-10-20 DIAGNOSIS — M19041 Primary osteoarthritis, right hand: Secondary | ICD-10-CM | POA: Diagnosis not present

## 2018-10-20 MED ORDER — HYDROXYCHLOROQUINE SULFATE 200 MG PO TABS: 200.0000 mg | ORAL_TABLET | Freq: Two times a day (BID) | ORAL | 0 refills | Status: DC

## 2018-10-23 ENCOUNTER — Encounter: Payer: 59 | Admitting: Podiatry

## 2018-10-24 ENCOUNTER — Ambulatory Visit (INDEPENDENT_AMBULATORY_CARE_PROVIDER_SITE_OTHER): Payer: 59 | Admitting: Podiatry

## 2018-10-24 DIAGNOSIS — M21619 Bunion of unspecified foot: Secondary | ICD-10-CM | POA: Diagnosis not present

## 2018-10-26 NOTE — Progress Notes (Signed)
Subjective: Kelly [name] is a 57 y.o. is seen today in office s/p left foot Austin bunionectomy preformed on 10/08/2018.  Overall she states that she is doing great.  She is not taking any significant pain medication she think she is maybe only taking 2 pills in the last saw her.  She has been in the cam boot.  She is been icing elevating.  She has no new concerns today. Denies any systemic complaints such as fevers, chills, nausea, vomiting. No calf pain, chest pain, shortness of breath.   Objective: General: No acute distress, AAOx3  DP/PT pulses palpable 2/4, CRT < 3 sec to all digits.  Protective sensation intact. Motor function intact.  LEFT foot: Incision is well coapted without any evidence of dehiscence with sutures intact. There is no surrounding erythema, ascending cellulitis, fluctuance, crepitus, malodor, drainage/purulence. There is minimal edema around the surgical site. There is no significant pain along the surgical site.  There is no tenderness palpation on the surgical site and there is no other areas of tenderness elicited at this time.  No signs of infection. No other areas of tenderness to bilateral lower extremities.  No other open lesions or pre-ulcerative lesions.  No pain with calf compression, swelling, warmth, erythema.   Assessment and Plan:  Status post left foot Austin bunionectomy, doing well with no complications   -Treatment options discussed including all alternatives, risks, and complications -Sutures knots were removed today.  Steri-Strips were applied for reinforcement although after removal incision remained well coapted.  Antibiotic ointment and a bandage was applied.  I want her to continue with similar dressings at home and wait next couple days before showering.  Continue in cam boot for now.  Also dispensed a surgical shoe for her to wear for short distances.  She is can be going to [place] for the next 4 weeks.  I also dispensed a Darco bunion splint to  help hold the toe in a rectus position to facilitate healing.  Compression anklet also dispensed.  Over the next 4 weeks I want her to continue to wear the surgical shoe or cam boot ice elevate and limit activity.  I discussed with her that if she has any issues to call and we can talk or worse case she can always go to urgent care or ER if there is any issues.  RTC 4 weeks. X-ray next appointment   Celesta Gentile, DPM

## 2018-11-17 ENCOUNTER — Other Ambulatory Visit: Payer: Self-pay | Admitting: Family Medicine

## 2018-11-17 MED FILL — HYDROXYCHLOROQUINE SULFATE: 200 | 90 days supply | Qty: 180 | Fill #0

## 2018-11-17 MED FILL — ESOMEPRAZOLE MAG DR 40 MG C: 40 | 90 days supply | Qty: 90 | Fill #0

## 2018-11-18 ENCOUNTER — Encounter: Payer: Self-pay | Admitting: Family Medicine

## 2018-11-19 MED ORDER — LOSARTAN POTASSIUM-HCTZ 50-12.5 MG PO TABS: 1.0000 | ORAL_TABLET | Freq: Every day | ORAL | 2 refills | Status: DC

## 2018-11-19 MED FILL — metFORMIN HCL 500 MG TABS: 500 | 45 days supply | Qty: 180 | Fill #2

## 2018-11-20 ENCOUNTER — Ambulatory Visit: Payer: 59 | Admitting: Physician Assistant

## 2018-11-21 ENCOUNTER — Other Ambulatory Visit: Payer: Self-pay | Admitting: *Deleted

## 2018-11-21 MED ORDER — HYDROCHLOROTHIAZIDE 12.5 MG PO TABS: 12.5000 mg | ORAL_TABLET | Freq: Every day | ORAL | 3 refills | Status: DC

## 2018-11-21 MED ORDER — LOSARTAN POTASSIUM 50 MG PO TABS: 50.0000 mg | ORAL_TABLET | Freq: Every day | ORAL | 3 refills | Status: DC

## 2018-11-21 MED FILL — HYDROCHLOROTHIAZIDE 12.5 MG: 12.5 | 90 days supply | Qty: 90 | Fill #0

## 2018-11-21 MED FILL — LOSARTAN POTASSIUM 50 MG TA: 50 | 90 days supply | Qty: 90 | Fill #0

## 2018-11-24 ENCOUNTER — Ambulatory Visit (INDEPENDENT_AMBULATORY_CARE_PROVIDER_SITE_OTHER): Payer: 59 | Admitting: Podiatry

## 2018-11-24 ENCOUNTER — Ambulatory Visit (INDEPENDENT_AMBULATORY_CARE_PROVIDER_SITE_OTHER): Payer: 59

## 2018-11-24 DIAGNOSIS — M21612 Bunion of left foot: Secondary | ICD-10-CM

## 2018-11-24 DIAGNOSIS — M21619 Bunion of unspecified foot: Secondary | ICD-10-CM

## 2018-12-01 NOTE — Progress Notes (Signed)
Subjective: Kelly [name] is a 58 y.o. is seen today in office s/p left foot Austin buionectomy preformed on 10/08/2018. They state their pain is controlled.  She has very minimal discomfort.  She is remained in the surgical shoe.  She uses the bunion splint but she left in [place] and she has not been using it recently.  She denies any recent injury.. Denies any systemic complaints such as fevers, chills, nausea, vomiting. No calf pain, chest pain, shortness of breath.   Objective: General: No acute distress, AAOx3  DP/PT pulses palpable 2/4, CRT < 3 sec to all digits.  Protective sensation intact. Motor function intact.  LEFT foot: Incision is well coapted without any evidence of dehiscence and a scar is formed. There is no surrounding erythema, ascending cellulitis, fluctuance, crepitus, malodor, drainage/purulence. There is mild edema around the surgical site. There is no pain along the surgical site.  No other areas of tenderness to bilateral lower extremities.  No other open lesions or pre-ulcerative lesions.  No pain with calf compression, swelling, warmth, erythema.   Assessment and Plan:  Status post left foot bunion surgery, doing well with no complications   -Treatment options discussed including all alternatives, risks, and complications -X-rays were obtained reviewed.  Hardware intact.  Appears that the fragment may have shifted somewhat but overall appears to be healing.  I did review this x-ray finding with her. -As time she can start to transition to regular shoe as tolerable.  I do want her to continue with the bunion splint to help hold the toe in rectus position during healing. -Discussed range of motion exercises -Ice/elevation -Pain medication as needed. -Monitor for any clinical signs or symptoms of infection and DVT/PE and directed to call the office immediately should any occur or go to the ER. -Follow-up as scheduled or sooner if any problems arise. In the meantime,  encouraged to call the office with any questions, concerns, change in symptoms.   Celesta Gentile, DPM

## 2018-12-24 ENCOUNTER — Ambulatory Visit: Payer: 59 | Admitting: Gastroenterology

## 2019-01-01 MED FILL — ACCU-CHEK FASTCLIX LANCETS: 90 days supply | Qty: 306 | Fill #1

## 2019-01-01 MED FILL — FLUOCINOLONE ACETONIDE SCAL: 0.01 | 30 days supply | Qty: 118 | Fill #4

## 2019-01-02 ENCOUNTER — Other Ambulatory Visit: Payer: Self-pay | Admitting: Family Medicine

## 2019-01-02 ENCOUNTER — Ambulatory Visit (INDEPENDENT_AMBULATORY_CARE_PROVIDER_SITE_OTHER): Payer: 59

## 2019-01-02 ENCOUNTER — Ambulatory Visit (INDEPENDENT_AMBULATORY_CARE_PROVIDER_SITE_OTHER): Payer: 59 | Admitting: Podiatry

## 2019-01-02 ENCOUNTER — Encounter: Payer: Self-pay | Admitting: Podiatry

## 2019-01-02 DIAGNOSIS — M2012 Hallux valgus (acquired), left foot: Secondary | ICD-10-CM | POA: Diagnosis not present

## 2019-01-02 MED FILL — ACCU-CHEK GUIDE STRP: 90 days supply | Qty: 300 | Fill #0

## 2019-01-02 MED FILL — ALPRAZolam 0.25 MG TABS: 0.25 | 15 days supply | Qty: 30 | Fill #0

## 2019-01-03 NOTE — Progress Notes (Signed)
Subjective: Kelly [name] is a 58 y.o. is seen today in office s/p left foot Austin buionectomy preformed on 10/08/2018.  Overall she states that she is doing much better.  She is back to wearing a regular shoe she is ready to back to work and she actually is back to work on Monday.  She has she states that she did a light jog and she was having no pain.  She has some occasional soreness at times but overall is doing much better.  She has no other concerns. Denies any fevers, chills, nausea, vomiting.  No calf pain, chest pain, shortness of breath.  Objective: General: No acute distress, AAOx3  DP/PT pulses palpable 2/4, CRT < 3 sec to all digits.  Protective sensation intact. Motor function intact.  LEFT foot: Incision is well coapted without any evidence of dehiscence and a scar is formed.  Crepitation with MPJ range of motion.  Very minimal edema.  There is no erythema or warmth. No pain to the surgical site.  The toe is position. No other areas of tenderness to bilateral lower extremities.  No other open lesions or pre-ulcerative lesions.  No pain with calf compression, swelling, warmth, erythema.   Assessment and Plan:  Status post left foot bunion surgery, doing well with no complications   -Treatment options discussed including all alternatives, risks, and complications -X-rays were obtained reviewed.  Hardware intact.  Osteotomy site appears to be healed.  Hardware intact.  Mild reoccurrence of the bunion but overall clinically she is doing much better she states her pain is much better than what was prior to surgery. -At this point she can return to work.  Note was provided for her to return to work on Monday.  Encouraged to continue with regular shoes and continue range of motion exercises.  As per my discharge from the postoperative care but there is any issues to let me know and she agrees with this plan has no further questions or concerns.  Trula Slade DPM

## 2019-01-09 ENCOUNTER — Ambulatory Visit (INDEPENDENT_AMBULATORY_CARE_PROVIDER_SITE_OTHER): Payer: Self-pay | Admitting: Physician Assistant

## 2019-01-09 ENCOUNTER — Encounter: Payer: Self-pay | Admitting: Physician Assistant

## 2019-01-09 VITALS — BP 140/82 | HR 96 | Temp 98.4°F | Resp 20 | Wt 263.0 lb

## 2019-01-09 DIAGNOSIS — R21 Rash and other nonspecific skin eruption: Secondary | ICD-10-CM

## 2019-01-09 DIAGNOSIS — J029 Acute pharyngitis, unspecified: Secondary | ICD-10-CM

## 2019-01-09 DIAGNOSIS — J069 Acute upper respiratory infection, unspecified: Secondary | ICD-10-CM

## 2019-01-09 LAB — POCT RAPID STREP A (OFFICE): Rapid Strep A Screen: NEGATIVE

## 2019-01-09 MED ORDER — IPRATROPIUM BROMIDE 0.03 % NA SOLN: 2.0000 | Freq: Two times a day (BID) | NASAL | 0 refills | Status: DC

## 2019-01-09 MED ORDER — BENZONATATE 100 MG PO CAPS: 100.0000 mg | ORAL_CAPSULE | Freq: Three times a day (TID) | ORAL | 0 refills | Status: DC | PRN

## 2019-01-09 MED ORDER — CLOTRIMAZOLE-BETAMETHASONE 1-0.05 % EX CREA: 1.0000 "application " | TOPICAL_CREAM | Freq: Two times a day (BID) | CUTANEOUS | 0 refills | Status: DC

## 2019-01-09 MED FILL — CLOTRIMAZOLE-BETAMETHASONE: 1-0.05 | 10 days supply | Qty: 30 | Fill #0

## 2019-01-09 MED FILL — BENZONATATE 100 MG CAPS: 100 | 7 days supply | Qty: 40 | Fill #0

## 2019-01-09 NOTE — Patient Instructions (Addendum)
Your strep test was negative, which is reassuring. This is likely viral upper respiratory infection.  I recommend resting, oral hydration, and eating light meals.  Continue allergy medicine as you are.  May also use Atrovent for runny nose and Tessalon Perles for cough.  Since you have high blood pressure, you may safely use over-the-counter Coricidin cough and cold for congestion.  For scratchy throat, you may use over-the-counter Tylenol or ibuprofen as needed for discomfort.  Drink warm tea with honey lemon and ginger.  Use warm mist humidifier at night time.  If no improvement in 7 to 10 days, please follow-up with your family doctor.  If any symptoms worsen or you develop new concerning symptoms, please seek care sooner.  For rash, it is unclear as to the cause at this time.  I recommend trying topical Lotrisone cream as prescribed.  Follow-up with your primary care doctor or dermatologist if no full resolution in 1 week.  Seek care sooner if symptoms worsen or you develop new concerning symptoms.  Please try to avoid scratching affected area as this could lead to secondary infection.  Signs of secondary infection would include fever, worsening redness, swelling, and pain.   Rash, Adult  A rash is a change in the color of your skin. A rash can also change the way your skin feels. There are many different conditions and factors that can cause a rash. Follow these instructions at home: The goal of treatment is to stop the itching and keep the rash from spreading. Watch for any changes in your symptoms. Let your doctor know about them. Follow these instructions to help with your condition: Medicine Take or apply over-the-counter and prescription medicines only as told by your doctor. These may include medicines:  To treat red or swollen skin (corticosteroid creams).  To treat itching.  To treat an allergy (oral antihistamines).  To treat very bad symptoms (oral corticosteroids).  Skin  care  Put cool cloths (compresses) on the affected areas.  Do not scratch or rub your skin.  Avoid covering the rash. Make sure that the rash is exposed to air as much as possible. Managing itching and discomfort  Avoid hot showers or baths. These can make itching worse. A cold shower may help.  Try taking a bath with: ? Epsom salts. You can get these at your local pharmacy or grocery store. Follow the instructions on the package. ? Baking soda. Pour a small amount into the bath as told by your doctor. ? Colloidal oatmeal. You can get this at your local pharmacy or grocery store. Follow the instructions on the package.  Try putting baking soda paste onto your skin. Stir water into baking soda until it gets like a paste.  Try putting on a lotion that relieves itchiness (calamine lotion).  Keep cool and out of the sun. Sweating and being hot can make itching worse. General instructions   Rest as needed.  Drink enough fluid to keep your pee (urine) pale yellow.  Wear loose-fitting clothing.  Avoid scented soaps, detergents, and perfumes. Use gentle soaps, detergents, perfumes, and other cosmetic products.  Avoid anything that causes your rash. Keep a journal to help track what causes your rash. Write down: ? What you eat. ? What cosmetic products you use. ? What you drink. ? What you wear. This includes jewelry.  Keep all follow-up visits as told by your doctor. This is important. Contact a doctor if:  You sweat at night.  You lose weight.  You pee (urinate) more than normal.  You pee less than normal, or you notice that your pee is a darker color than normal.  You feel weak.  You throw up (vomit).  Your skin or the whites of your eyes look yellow (jaundice).  Your skin: ? Tingles. ? Is numb.  Your rash: ? Does not go away after a few days. ? Gets worse.  You are: ? More thirsty than normal. ? More tired than normal.  You have: ? New symptoms. ? Pain  in your belly (abdomen). ? A fever. ? Watery poop (diarrhea). Get help right away if:  You have a fever and your symptoms suddenly get worse.  You start to feel mixed up (confused).  You have a very bad headache or a stiff neck.  You have very bad joint pains or stiffness.  You have jerky movements that you cannot control (seizure).  Your rash covers all or most of your body. The rash may or may not be painful.  You have blisters that: ? Are on top of the rash. ? Grow larger. ? Grow together. ? Are painful. ? Are inside your nose or mouth.  You have a rash that: ? Looks like purple pinprick-sized spots all over your body. ? Has a "bull's eye" or looks like a target. ? Is red and painful, causes your skin to peel, and is not from being in the sun too long. Summary  A rash is a change in the color of your skin. A rash can also change the way your skin feels.  The goal of treatment is to stop the itching and keep the rash from spreading.  Take or apply over-the-counter and prescription medicines only as told by your doctor.  Contact a doctor if you have new symptoms or symptoms that get worse.  Keep all follow-up visits as told by your doctor. This is important. This information is not intended to replace advice given to you by your health care provider. Make sure you discuss any questions you have with your health care provider. Document Released: 04/09/2008 Document Revised: 05/26/2018 Document Reviewed: 05/26/2018 Elsevier Interactive Patient Education  2019 [employer].  Viral Respiratory Infection A viral respiratory infection is an illness that affects parts of the body that are used for breathing. These include the lungs, nose, and throat. It is caused by a germ called a virus. Some examples of this kind of infection are:  A cold.  The flu (influenza).  A respiratory syncytial virus (RSV) infection. A person who gets this illness may have the following  symptoms:  A stuffy or runny nose.  Yellow or green fluid in the nose.  A cough.  Sneezing.  Tiredness (fatigue).  Achy muscles.  A sore throat.  Sweating or chills.  A fever.  A headache. Follow these instructions at home: Managing pain and congestion  Take over-the-counter and prescription medicines only as told by your doctor.  If you have a sore throat, gargle with salt water. Do this 3-4 times per day or as needed. To make a salt-water mixture, dissolve -1 tsp of salt in 1 cup of warm water. Make sure that all the salt dissolves.  Use nose drops made from salt water. This helps with stuffiness (congestion). It also helps soften the skin around your nose.  Drink enough fluid to keep your pee (urine) pale yellow. General instructions   Rest as much as possible.  Do not drink alcohol.  Do not use any  products that have nicotine or tobacco, such as cigarettes and e-cigarettes. If you need help quitting, ask your doctor.  Keep all follow-up visits as told by your doctor. This is important. How is this prevented?   Get a flu shot every year. Ask your doctor when you should get your flu shot.  Do not let other people get your germs. If you are sick: ? Stay home from work or school. ? Wash your hands with soap and water often. Wash your hands after you cough or sneeze. If soap and water are not available, use hand sanitizer.  Avoid contact with people who are sick during cold and flu season. This is in fall and winter. Get help if:  Your symptoms last for 10 days or longer.  Your symptoms get worse over time.  You have a fever.  You have very bad pain in your face or forehead.  Parts of your jaw or neck become very swollen. Get help right away if:  You feel pain or pressure in your chest.  You have shortness of breath.  You faint or feel like you will faint.  You keep throwing up (vomiting).  You feel confused. Summary  A viral respiratory  infection is an illness that affects parts of the body that are used for breathing.  Examples of this illness include a cold, the flu, and respiratory syncytial virus (RSV) infection.  The infection can cause a runny nose, cough, sneezing, sore throat, and fever.  Follow what your doctor tells you about taking medicines, drinking lots of fluid, washing your hands, resting at home, and avoiding people who are sick. This information is not intended to replace advice given to you by your health care provider. Make sure you discuss any questions you have with your health care provider. Document Released: 10/04/2008 Document Revised: 12/02/2017 Document Reviewed: 12/02/2017 Elsevier Interactive Patient Education  2019 [employer].

## 2019-01-09 NOTE — Progress Notes (Signed)
Kelly [name]  MRN: [id number] DOB: 1961/08/06  Subjective:  Kelly [name] is a 58 y.o. female seen in office today for a chief complaint of 2 complaints.   1) Has been having scratchy throat, runny nose, ear fullness, and mild intermittent dry cough for the past 3 days.  Was exposed to someone at work with strep and wants to have strep test.  Denies fever, body aches, chills, neck pain, drooling, voice change, inability to swallow, sinus pain, cough, shortness of breath, wheezing, chest pain, abdominal pain, nausea, vomiting, diarrhea.  Has not really taken anything for relief because she does not know what to take.  Patient had flu shot this season.  Denies smoking.  PMH of diet controlled diabetes (no longer taking metformin): Last A1c 5.8; hypertension; RA: On Plaquenil.  No PMH of asthma.  2) rash involving the left upper thigh x2 weeks.  Has been having intermittent itching in the affected area.  Noticed some red bumps after scratching so wanted to get it evaluated.  Rash has not changed in appearance.  Denies pain.  Denies rash on any other location of her body, fatigue, new joint pain, hematuria, abdominal pain, nausea, vomiting, chest pain or shortness of breath.  Patient has not had contacts with similar rash. Patient has not had new exposures (soaps, lotions, laundry detergents, foods, medications, plants, insects or animals).  Has been trying over-the-counter " rash cream."  Not sure what the cream was called and not sure if it is helping. Does not take any blood thinners.    Review of Systems  Constitutional: Negative for diaphoresis.  Eyes: Negative for visual disturbance.  Musculoskeletal: Negative for neck pain.  Neurological: Negative for dizziness.    Patient Active Problem List   Diagnosis Date Noted  . Bunion 07/28/2018  . Rectal bleeding 06/17/2018  . Positive colorectal cancer screening using Cologuard test 06/17/2018  . GAD (generalized anxiety disorder) 02/19/2018    . Psoriasis 01/13/2018  . Rheumatoid arthritis involving multiple sites with positive rheumatoid factor ([place]) 11/22/2017  . Diabetes mellitus, type II ([place]) 11/04/2017  . Bronchial stenosis   . Essential hypertension 11/29/2015  . 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 Hemorrhoids, internal 03/04/2013  . External hemorrhoids with pain 03/04/2013  . Depression   . Seasonal allergies   . Irritable bowel syndrome with constipation >> diarrhea   . Headache disorder   . GERD (gastroesophageal reflux disease) 01/28/2013  . Chest pain 08/08/2012  . Hyperlipidemia 08/08/2012  . Morbid obesity ([place]) 08/08/2012   Current Outpatient Medications on File Prior to Visit  Medication Sig Dispense Refill  . aspirin 81 MG tablet Take 81 mg by mouth daily.    . cetirizine (ZYRTEC) 10 MG tablet Take 10 mg by mouth daily.    . Cyanocobalamin (VITAMIN B-12) 2500 MCG SUBL 1 tablet daily (Patient taking differently: Take 2,500 mcg by mouth daily. 1 tablet daily)  0  . diclofenac sodium (VOLTAREN) 1 % GEL Apply 2 g topically daily as needed (pain).    Marland Kitchen esomeprazole (NEXIUM) 40 MG capsule TAKE 1 CAPSULE (40 MG TOTAL) BY MOUTH DAILY. 90 capsule 3  . Fluocinolone Acetonide Scalp 0.01 % OIL Apply 1 application topically daily as needed (flare ups).   6  . fluticasone (FLONASE) 50 MCG/ACT nasal spray Place 2 sprays into both nostrils daily. (Patient taking differently: Place 2 sprays into both nostrils daily as needed for allergies. ) 48 g 3  . Garlic 4332 MG CAPS  Take 1,000 mg by mouth daily.     . Glucose Blood (BLOOD GLUCOSE TEST STRIPS) STRP USE TO MONITOR BLOOD SUGAR 3 TIMES DAILY 100 each 11  . hydrochlorothiazide (HYDRODIURIL) 12.5 MG tablet Take 1 tablet (12.5 mg total) by mouth daily. 90 tablet 3  . hydroxychloroquine (PLAQUENIL) 200 MG tablet Take 1 tablet (200 mg total) by mouth 2 (two) times daily. 180 tablet 0  . ibuprofen (ADVIL,MOTRIN) 800 MG tablet Take 800 mg by mouth daily as  needed for moderate pain.     Marland Kitchen losartan (COZAAR) 50 MG tablet Take 1 tablet (50 mg total) by mouth daily. 90 tablet 3  . losartan-hydrochlorothiazide (HYZAAR) 50-12.5 MG tablet Take 1 tablet by mouth daily. 90 tablet 2  . Omega-3 Fatty Acids (FISH OIL PO) Take 1 capsule by mouth 3 (three) times a week.     . TURMERIC PO Take 1 tablet by mouth daily.     Marland Kitchen ACCU-CHEK FASTCLIX LANCETS MISC USE TO MONITOR BLOOD SUGAR 3 TIMES DAILY 306 each 1  . ACCU-CHEK FASTCLIX LANCETS MISC USE TO MONITOR BLOOD SUGAR 3 TIMES DAILY 306 each 1  . metFORMIN (GLUCOPHAGE) 500 MG tablet Take 1 tablet (500 mg total) by mouth daily with breakfast. (Patient not taking: Reported on 01/09/2019) 180 tablet 3  . oxyCODONE-acetaminophen (PERCOCET) 5-325 MG tablet Take 1-2 tablets by mouth every 6 (six) hours as needed for severe pain. (Patient not taking: Reported on 01/09/2019) 25 tablet 0  . Prenatal Vit-Fe Fumarate-FA (PRENATAL MULTIVITAMIN) TABS tablet Take 1 tablet by mouth daily at 12 noon.    . promethazine (PHENERGAN) 25 MG tablet Take 1 tablet (25 mg total) by mouth every 8 (eight) hours as needed for nausea or vomiting. (Patient not taking: Reported on 01/09/2019) 20 tablet 0   No current facility-administered medications on file prior to visit.     Allergies  Allergen Reactions  . Codeine Nausea Only  . Topamax [Topiramate] Nausea Only  . Maxalt [Rizatriptan] Nausea Only     Objective:  BP 140/82 (BP Location: Right Arm, Patient Position: Sitting, Cuff Size: Large)   Pulse 96   Temp 98.4 F (36.9 C) (Temporal)   Resp 20   Wt 263 lb (119.3 kg)   SpO2 97%   BMI 41.19 kg/m   Physical Exam Vitals signs reviewed.  Constitutional:      General: She is not in acute distress.    Appearance: She is well-developed. She is not ill-appearing or toxic-appearing.  HENT:     Head: Normocephalic and atraumatic.     Right Ear: Tympanic membrane, ear canal and external ear normal.     Left Ear: Tympanic membrane, ear  canal and external ear normal.     Nose: Mucosal edema (mild) and congestion present.     Right Sinus: No maxillary sinus tenderness or frontal sinus tenderness.     Left Sinus: No maxillary sinus tenderness or frontal sinus tenderness.     Mouth/Throat:     Lips: Pink.     Mouth: Mucous membranes are moist.     Pharynx: Uvula midline. Posterior oropharyngeal erythema (mild erythema of soft palate and oropharynx) present.     Tonsils: No tonsillar exudate or tonsillar abscesses. Swelling: 1+ on the right. 1+ on the left.     Comments: One white tonsillolith noted on right tonsil, removed when strep swab was collected. Eyes:     Conjunctiva/sclera: Conjunctivae normal.  Neck:     Musculoskeletal: Full passive range of motion  without pain and normal range of motion. No edema or neck rigidity.     Trachea: Phonation normal. No tracheal deviation.  Cardiovascular:     Rate and Rhythm: Normal rate and regular rhythm.     Heart sounds: Normal heart sounds.  Pulmonary:     Effort: Pulmonary effort is normal.     Breath sounds: Normal breath sounds. No decreased breath sounds, wheezing, rhonchi or rales.  Lymphadenopathy:     Head:     Right side of head: No submental, submandibular, tonsillar, preauricular, posterior auricular or occipital adenopathy.     Left side of head: No submental, submandibular, tonsillar, preauricular, posterior auricular or occipital adenopathy.     Cervical: No cervical adenopathy.     Upper Body:     Right upper body: No supraclavicular adenopathy.     Left upper body: No supraclavicular adenopathy.  Skin:    General: Skin is warm and dry.          Comments: No rash involvement of b/l groin region.   Neurological:     Mental Status: She is alert.        Results for orders placed or performed in visit on 01/09/19 (from the past 24 hour(s))  POCT rapid strep A     Status: None   Collection Time: 01/09/19  3:50 PM  Result Value Ref Range   Rapid Strep  A Screen Negative Negative   Centor Score of -1   Assessment and Plan :  1. Viral URI Patient is overall well-appearing, no acute distress.  VSS. Afebrile.  Point-of-care strep test collected for patient reassurance, it was negative.  History and physical exam are consistent with viral URI. No erythema, swelling, or exudates of b/l tonsils. No TTP of b/l sinuses. Lungs CTAB.   Given educational material on viral URI.  Recommend symptomatic treatment at this time. No indication for antibiotics as this time but w/ her PMH, would recommend close f/u with family doctor, or local urgent care if no improvement in symptoms over the next 5-7 days.  Seek care sooner at local urgent care or ED if symptoms worsen/develop new concerning symptoms.  Patient voices understanding. - benzonatate (TESSALON) 100 MG capsule; Take 1-2 capsules (100-200 mg total) by mouth 3 (three) times daily as needed for cough.  Dispense: 40 capsule; Refill: 0 - ipratropium (ATROVENT) 0.03 % nasal spray; Place 2 sprays into both nostrils 2 (two) times daily.  Dispense: 30 mL; Refill: 0  2. Sore throat - POCT rapid strep A  3. Rash Appearance of affected area pt is concerned about is consistent with petechiae. She also has few petechiae on the right upper thigh in similar location. Suspect that this was developed from either scratching the area when it was itching or repetitive friction from clothing. Suspect dry skin could be causing itching. No obvious signs of tinea, intertrigo, or dermatitis. Would recommend topical moisturizer, avoiding tight fitting clothing, and trial of lotrisone cream. W/ her hx of RA, would rec close f/u with PCP or dermatologist if this does not improve over the next week as she would likely need additional testing/possible bx to rule out coagulation disorder vs vasculitis vs autoimmune etiology vs reaction to chronic medication.  - clotrimazole-betamethasone (LOTRISONE) cream; Apply 1 application topically 2  (two) times daily.  Dispense: 30 g; Refill: 0  Kelly [name], [facility] 01/09/2019 4:55 PM

## 2019-01-12 LAB — HM DIABETES EYE EXAM

## 2019-01-16 ENCOUNTER — Encounter: Payer: Self-pay | Admitting: *Deleted

## 2019-01-19 ENCOUNTER — Other Ambulatory Visit: Payer: Self-pay | Admitting: Family Medicine

## 2019-01-19 DIAGNOSIS — Z01419 Encounter for gynecological examination (general) (routine) without abnormal findings: Secondary | ICD-10-CM | POA: Diagnosis not present

## 2019-01-19 DIAGNOSIS — Z6841 Body Mass Index (BMI) 40.0 and over, adult: Secondary | ICD-10-CM | POA: Diagnosis not present

## 2019-01-19 MED FILL — IBUPROFEN 800 MG TABS: 800 | 90 days supply | Qty: 180 | Fill #0

## 2019-01-20 ENCOUNTER — Other Ambulatory Visit: Payer: Self-pay | Admitting: Physician Assistant

## 2019-01-20 MED FILL — HYDROXYCHLOROQUINE SULFATE: 200 | 90 days supply | Qty: 180 | Fill #0

## 2019-01-20 MED FILL — LOSARTAN POTASSIUM 50 MG TA: 50 | 90 days supply | Qty: 90 | Fill #0

## 2019-01-20 MED FILL — HYDROCHLOROTHIAZIDE 12.5 MG: 12.5 | 90 days supply | Qty: 90 | Fill #0

## 2019-01-20 MED FILL — ESOMEPRAZOLE MAG DR 40 MG C: 40 | 90 days supply | Qty: 90 | Fill #0

## 2019-01-20 NOTE — Telephone Encounter (Signed)
Last Visit: 10/21/19 Next visit due May 2020. Labs: 09/18/18 Potassium 3.3 Glucose 101 PLQ Eye Exam:01/13/19 WNL  Okay to refill per Dr. Estanislado Pandy

## 2019-01-20 NOTE — Telephone Encounter (Signed)
ok.  Please advise patient to discuss low potassium with her PCP.  Please forward labs to her PCP.

## 2019-01-25 ENCOUNTER — Encounter: Payer: Self-pay | Admitting: Family Medicine

## 2019-01-26 ENCOUNTER — Other Ambulatory Visit: Payer: Self-pay | Admitting: *Deleted

## 2019-01-26 ENCOUNTER — Other Ambulatory Visit: Payer: Self-pay | Admitting: Family Medicine

## 2019-01-26 MED ORDER — LOSARTAN POTASSIUM 25 MG PO TABS: 12.5000 mg | ORAL_TABLET | Freq: Every day | ORAL | 0 refills | Status: DC

## 2019-01-26 MED FILL — LOSARTAN POTASSIUM 25 MG TA: 25 | 45 days supply | Qty: 90 | Fill #0

## 2019-01-29 ENCOUNTER — Telehealth: Payer: Self-pay | Admitting: *Deleted

## 2019-01-29 NOTE — Telephone Encounter (Signed)
Received call from patient.   Reports that she is having flare in shoulder of RA. Requested prescription for Voltaren Gel. Ok to order?

## 2019-01-30 MED ORDER — DICLOFENAC SODIUM 1 % TD GEL: 2.0000 g | Freq: Four times a day (QID) | TRANSDERMAL | 11 refills | Status: AC | PRN

## 2019-01-30 MED FILL — FLUTICASONE PROP 50 MCG SPR: 50 | 90 days supply | Qty: 48 | Fill #0

## 2019-01-30 MED FILL — DICLOFENAC SODIUM 1 % GEL: 1 | 13 days supply | Qty: 100 | Fill #0

## 2019-01-30 NOTE — Telephone Encounter (Signed)
Okay to order voltaren gel use QID prn pain

## 2019-01-30 NOTE — Telephone Encounter (Signed)
Prescription sent to pharmacy.   Patient aware per MyChart.  

## 2019-04-02 MED ORDER — HYDROCHLOROTHIAZIDE 12.5 MG PO TABS: 12.5000 mg | ORAL_TABLET | Freq: Every day | ORAL | 1 refills | Status: DC

## 2019-04-02 MED ORDER — LOSARTAN POTASSIUM 25 MG PO TABS: 12.5000 mg | ORAL_TABLET | Freq: Every day | ORAL | 1 refills | Status: DC

## 2019-04-20 ENCOUNTER — Other Ambulatory Visit: Payer: Self-pay | Admitting: *Deleted

## 2019-04-20 MED ORDER — LOSARTAN POTASSIUM 25 MG PO TABS: 12.5000 mg | ORAL_TABLET | Freq: Every day | ORAL | 1 refills | Status: DC

## 2019-04-27 ENCOUNTER — Telehealth: Payer: Self-pay | Admitting: Rheumatology

## 2019-04-27 NOTE — Telephone Encounter (Signed)
Received a call from [facility] checking if we received their request.I advised we do not have and gave fax [phone]

## 2019-07-14 ENCOUNTER — Telehealth: Payer: Self-pay | Admitting: *Deleted

## 2019-07-14 NOTE — Telephone Encounter (Signed)
Received call from patient.   Reports that she had RA flare over the weekend and called on call MD for medication. States that she was given prednisone for flare. Reports that her BS have been slightly elevated. States that they are running around 150. Advised to continue to monitor FSBS and to notify office if >200.

## 2019-07-15 ENCOUNTER — Other Ambulatory Visit: Payer: Self-pay | Admitting: *Deleted

## 2019-07-15 MED ORDER — HYDROCHLOROTHIAZIDE 12.5 MG PO TABS: 12.5000 mg | ORAL_TABLET | Freq: Every day | ORAL | 1 refills | Status: DC

## 2019-07-15 MED ORDER — LOSARTAN POTASSIUM 25 MG PO TABS: 12.5000 mg | ORAL_TABLET | Freq: Every day | ORAL | 1 refills | Status: AC

## 2019-07-15 NOTE — Addendum Note (Signed)
Addended by: Sheral Flow on: 07/15/2019 01:35 PM   Modules accepted: Orders

## 2019-07-23 ENCOUNTER — Encounter: Payer: Self-pay | Admitting: Family Medicine

## 2019-07-24 ENCOUNTER — Encounter: Payer: Self-pay | Admitting: Rheumatology

## 2019-07-24 ENCOUNTER — Telehealth: Payer: Self-pay

## 2019-07-24 ENCOUNTER — Other Ambulatory Visit: Payer: Self-pay

## 2019-07-24 ENCOUNTER — Telehealth (INDEPENDENT_AMBULATORY_CARE_PROVIDER_SITE_OTHER): Payer: 59 | Admitting: Rheumatology

## 2019-07-24 DIAGNOSIS — M0579 Rheumatoid arthritis with rheumatoid factor of multiple sites without organ or systems involvement: Secondary | ICD-10-CM

## 2019-07-24 DIAGNOSIS — M19042 Primary osteoarthritis, left hand: Secondary | ICD-10-CM

## 2019-07-24 DIAGNOSIS — M19041 Primary osteoarthritis, right hand: Secondary | ICD-10-CM

## 2019-07-24 DIAGNOSIS — Z79899 Other long term (current) drug therapy: Secondary | ICD-10-CM

## 2019-07-24 DIAGNOSIS — Z85528 Personal history of other malignant neoplasm of kidney: Secondary | ICD-10-CM

## 2019-07-24 DIAGNOSIS — I10 Essential (primary) hypertension: Secondary | ICD-10-CM

## 2019-07-24 DIAGNOSIS — Z8679 Personal history of other diseases of the circulatory system: Secondary | ICD-10-CM

## 2019-07-24 DIAGNOSIS — M1611 Unilateral primary osteoarthritis, right hip: Secondary | ICD-10-CM

## 2019-07-24 DIAGNOSIS — M19072 Primary osteoarthritis, left ankle and foot: Secondary | ICD-10-CM

## 2019-07-24 DIAGNOSIS — M19071 Primary osteoarthritis, right ankle and foot: Secondary | ICD-10-CM

## 2019-07-24 DIAGNOSIS — Z8639 Personal history of other endocrine, nutritional and metabolic disease: Secondary | ICD-10-CM

## 2019-07-24 DIAGNOSIS — Z8719 Personal history of other diseases of the digestive system: Secondary | ICD-10-CM

## 2019-07-24 MED ORDER — HYDROXYCHLOROQUINE SULFATE 200 MG PO TABS: 200.0000 mg | ORAL_TABLET | Freq: Two times a day (BID) | ORAL | 0 refills | Status: DC

## 2019-07-24 MED ORDER — PREDNISONE 5 MG PO TABS: ORAL_TABLET | ORAL | 0 refills | Status: DC

## 2019-07-24 NOTE — Telephone Encounter (Signed)
Patient will be starting MTX after labs. Please send in MTX and folic acid, refer to note for dosage.  Patient is to call with preferred lab in [place]. Please send orders.

## 2019-07-24 NOTE — Patient Instructions (Addendum)
Standing Labs We placed an order today for your standing lab work.    Please get your standing labs in 2 weeks x2, 2 months and then every 3 months after starting methotrexate.     Methotrexate tablets What is this medicine? METHOTREXATE (METH oh TREX ate) is a chemotherapy drug used to treat cancer including breast cancer, leukemia, and lymphoma. This medicine can also be used to treat psoriasis and certain kinds of arthritis. This medicine may be used for other purposes; ask your health care provider or pharmacist if you have questions. COMMON BRAND NAME(S): Rheumatrex, Trexall What should I tell my health care provider before I take this medicine? They need to know if you have any of these conditions:  fluid in the stomach area or lungs  if you often drink alcohol  infection or immune system problems  kidney disease or on hemodialysis  liver disease  low blood counts, like low white cell, platelet, or red cell counts  lung disease  radiation therapy  stomach ulcers  ulcerative colitis  an unusual or allergic reaction to methotrexate, other medicines, foods, dyes, or preservatives  pregnant or trying to get pregnant  breast-feeding How should I use this medicine? Take this medicine by mouth with a glass of water. Follow the directions on the prescription label. Take your medicine at regular intervals. Do not take it more often than directed. Do not stop taking except on your doctor's advice. Make sure you know why you are taking this medicine and how often you should take it. If this medicine is used for a condition that is not cancer, like arthritis or psoriasis, it should be taken weekly, NOT daily. Taking this medicine more often than directed can cause serious side effects, even death. Talk to your healthcare provider about safe handling and disposal of this medicine. You may need to take special precautions. Talk to your pediatrician regarding the use of this  medicine in children. While this drug may be prescribed for selected conditions, precautions do apply. Overdosage: If you think you have taken too much of this medicine contact a poison control center or emergency room at once. NOTE: This medicine is only for you. Do not share this medicine with others. What if I miss a dose? If you miss a dose, talk with your doctor or health care professional. Do not take double or extra doses. What may interact with this medicine? This medicine may interact with the following medication:  acitretin  aspirin and aspirin-like medicines including salicylates  azathioprine  certain antibiotics like penicillins, tetracycline, and chloramphenicol  cyclosporine  gold  hydroxychloroquine  live virus vaccines  NSAIDs, medicines for pain and inflammation, like ibuprofen or naproxen  other cytotoxic agents  penicillamine  phenylbutazone  phenytoin  probenecid  retinoids such as isotretinoin and tretinoin  steroid medicines like prednisone or cortisone  sulfonamides like sulfasalazine and trimethoprim/sulfamethoxazole  theophylline This list may not describe all possible interactions. Give your health care provider a list of all the medicines, herbs, non-prescription drugs, or dietary supplements you use. Also tell them if you smoke, drink alcohol, or use illegal drugs. Some items may interact with your medicine. What should I watch for while using this medicine? Avoid alcoholic drinks. This medicine can make you more sensitive to the sun. Keep out of the sun. If you cannot avoid being in the sun, wear protective clothing and use sunscreen. Do not use sun lamps or tanning beds/booths. You may need blood work done while you  are taking this medicine. Call your doctor or health care professional for advice if you get a fever, chills or sore throat, or other symptoms of a cold or flu. Do not treat yourself. This drug decreases your body's ability  to fight infections. Try to avoid being around people who are sick. This medicine may increase your risk to bruise or bleed. Call your doctor or health care professional if you notice any unusual bleeding. Check with your doctor or health care professional if you get an attack of severe diarrhea, nausea and vomiting, or if you sweat a lot. The loss of too much body fluid can make it dangerous for you to take this medicine. Talk to your doctor about your risk of cancer. You may be more at risk for certain types of cancers if you take this medicine. Both men and women must use effective birth control with this medicine. Do not become pregnant while taking this medicine or until at least 1 normal menstrual cycle has occurred after stopping it. Women should inform their doctor if they wish to become pregnant or think they might be pregnant. Men should not father a child while taking this medicine and for 3 months after stopping it. There is a potential for serious side effects to an unborn child. Talk to your health care professional or pharmacist for more information. Do not breast-feed an infant while taking this medicine. What side effects may I notice from receiving this medicine? Side effects that you should report to your doctor or health care professional as soon as possible:  allergic reactions like skin rash, itching or hives, swelling of the face, lips, or tongue  breathing problems or shortness of breath  diarrhea  dry, nonproductive cough  low blood counts - this medicine may decrease the number of white blood cells, red blood cells and platelets. You may be at increased risk for infections and bleeding.  mouth sores  redness, blistering, peeling or loosening of the skin, including inside the mouth  signs of infection - fever or chills, cough, sore throat, pain or trouble passing urine  signs and symptoms of bleeding such as bloody or black, tarry stools; red or dark-brown urine;  spitting up blood or brown material that looks like coffee grounds; red spots on the skin; unusual bruising or bleeding from the eye, gums, or nose  signs and symptoms of kidney injury like trouble passing urine or change in the amount of urine  signs and symptoms of liver injury like dark yellow or brown urine; general ill feeling or flu-like symptoms; light-colored stools; loss of appetite; nausea; right upper belly pain; unusually weak or tired; yellowing of the eyes or skin Side effects that usually do not require medical attention (report to your doctor or health care professional if they continue or are bothersome):  dizziness  hair loss  tiredness  upset stomach  vomiting This list may not describe all possible side effects. Call your doctor for medical advice about side effects. You may report side effects to FDA at 1-800-FDA-1088. Where should I keep my medicine? Keep out of the reach of children. Store at room temperature between 20 and 25 degrees C (68 and 77 degrees F). Protect from light. Throw away any unused medicine after the expiration date. NOTE: This sheet is a summary. It may not cover all possible information. If you have questions about this medicine, talk to your doctor, pharmacist, or health care provider.  2020 [employer]vier/Gold Standard (2017-06-13 13:38:43)

## 2019-07-24 NOTE — Progress Notes (Addendum)
Virtual Visit via Video Note  I connected with Kelly [name] on 07/24/19 at  3:30 PM EDT by a video enabled telemedicine application and verified that I am speaking with the correct person using two identifiers.  Location: Patient: Home  Provider: Clinic  This service was conducted via virtual visit.  Both audio and visual tools were used.  The patient was located at home. I was located in my office.  Consent was obtained prior to the virtual visit and is aware of possible charges through their insurance for this visit.  The patient is an established patient.  Dr. Estanislado Pandy, MD conducted the virtual visit and Hazel Sams, PA-C acted as scribe during the service.  Office staff helped with scheduling follow up visits after the service was conducted.   I discussed the limitations of evaluation and management by telemedicine and the availability of in person appointments. The patient expressed understanding and agreed to proceed.  CC: Pain in both hands  History of Present Illness: Patient is a 58 year old female with a past medical history of seropositive rheumatoid arthritis and osteoarthritis.  She is Plaquenil 200 mg 1 tablet BID.  She has been holding PLQ for the past 2 weeks.  According to the patient she had an eye exam in March 2020. She continues to take tumeric and garlic daily.  She states she started having a flare in the right wrist and left 2nd PIP joint 2 weeks ago.  She states she was prescribed a 7-day course of prednisone by her PCP.  She has been off of prednisone for 1 week, and she has been having increased pain and swelling in the right wrist and left 2nd PIP joint.  She is having difficulty finding a rheumatologist in [place].  She would like to discuss starting on combination therapy since PLQ has not been as effective recently.   Medications were reviewed with the patient. She denies any new allergies.   Review of Systems  Constitutional: Negative for fever and  malaise/fatigue.  Eyes: Negative for photophobia, pain, discharge and redness.  Respiratory: Negative for cough, shortness of breath and wheezing.   Cardiovascular: Negative for chest pain and palpitations.  Gastrointestinal: Negative for blood in stool, constipation and diarrhea.  Genitourinary: Negative for dysuria.  Musculoskeletal: Positive for joint pain. Negative for back pain, myalgias and neck pain.       +Joint swelling +Morning stiffness   Skin: Negative for rash.  Neurological: Negative for dizziness and headaches.  Psychiatric/Behavioral: Positive for depression. The patient is not nervous/anxious and does not have insomnia.       Observations/Objective: Physical Exam  Constitutional: She is oriented to person, place, and time and well-developed, well-nourished, and in no distress.  HENT:  Head: Normocephalic and atraumatic.  Eyes: Conjunctivae are normal.  Pulmonary/Chest: Effort normal.  Neurological: She is alert and oriented to person, place, and time.  Psychiatric: Mood, memory, affect and judgment normal.   Patient reports morning stiffness for several hours Patient reports nocturnal pain.  Difficulty dressing/grooming: Denies Difficulty climbing stairs: Denies Difficulty getting out of chair: Denies Difficulty using hands for taps, buttons, cutlery, and/or writing: Reports   Assessment and Plan: Visit Diagnoses: Rheumatoid arthritis involving multiple sites with positive rheumatoid factor (HCC) - RF+, CCP+, elevated ESR: She is having right wrist tenderness and inflammation.  She has left 2nd PIP joint erythema and synovitis. She is having difficulty with ADLs due to the discomfort and inflammation.  She states she took a prednisone  taper 2 weeks ago for 7 days.  She has been off of prednisone for 1 week, and she reports the pain and swelling has returned.  She has been taking Plaquenil 200 mg 1 tablet BID, but she discontinued 2 weeks ago.  A refill of PLQ will  be sent to the pharmacy. She would like to start on combination therapy.  Indications, contraindications, and potential side effects of Methotrexate were discussed.  All questions were addressed. She requires CBC and CMP prior to starting on MTX.  We will send a handout of information about MTX and a consent form for her to complete prior to sending in a prescription.   Once labs are updated and the consent form is returned, a prescription for Methotrexate will be sent to the pharmacy.  She will start on Methotrexate 6 tablets by mouth once weekly x2 weeks and if labs are stable she will increase to Methotrexate 8 tablets by mouth once weekly. She will take folic acid 2 mg po daily, which will also be sent to the pharmacy. A prednisone taper starting at 10 mg tapering by 2.5 mg every week will be sent to the pharmacy.  She will require lab work prior to starting on MTX then in 2 weeks x2, 2 months, then every 3 months.  She will have a virtual visit in 3 weeks.   Drug Counseling Labs from 11/15/17: IFE no monoclonal protein detected  HIV negative TB gold negative Hep B and Hep C negative  CXR: 11/16/17   Patient was counseled on the purpose, proper use, and adverse effects of methotrexate including nausea, infection, and signs and symptoms of pneumonitis.  Reviewed instructions with patient to take methotrexate weekly along with folic acid daily.  Discussed the importance of frequent monitoring of kidney and liver function and blood counts, and provided patient with standing lab instructions.  Counseled patient to avoid NSAIDs and alcohol while on methotrexate.  Provided patient with educational materials on methotrexate and answered all questions.  Advised patient to get annual influenza vaccine and to get a pneumococcal vaccine if patient has not already had one.  Patient voiced understanding.  Patient consented to methotrexate use.  Will upload into chart.    High risk medication use -  PLQ 200 mg  BID.  She will be starting on MTX as discussed above.   Last Plaquenil eye exam on file was normal on 12/24/2017.  She reports she also had an updated eye exam in March 2020. We will try to obtain the updated PLQ eye exam.   Primary osteoarthritis of both hands:  She is having increased pain in both hands.  She has synovitis and tenderness of the right wrist joint.  Joint protection and muscle strengthening were discussed.   Unilateral primary osteoarthritis, right hip: She has occasional right hip pain.   Primary osteoarthritis of both feet: She had a left bunionectomy on 10/08/18 performed by Dr. Earleen Newport.    Other medical conditions are listed as follow:   History of hypertension  History of gastroesophageal reflux (GERD)  History of diabetes mellitus  History of hyperlipidemia  History of IBS   Follow Up Instructions: F/u in 3 weeks.  Patient was also advised to establish with a rheumatologist as soon as possible.   I discussed the assessment and treatment plan with the patient. The patient was provided an opportunity to ask questions and all were answered. The patient agreed with the plan and demonstrated an understanding of the instructions.  The patient was advised to call back or seek an in-person evaluation if the symptoms worsen or if the condition fails to improve as anticipated.  I provided 40 minutes of non-face-to-face time during this encounter.   Bo Merino, MD   Scribed by-  Hazel Sams, PA-C

## 2019-07-27 ENCOUNTER — Telehealth: Payer: Self-pay | Admitting: Rheumatology

## 2019-07-27 ENCOUNTER — Encounter: Payer: Self-pay | Admitting: *Deleted

## 2019-07-27 NOTE — Telephone Encounter (Signed)
Patient left a message 07/24/2019 at 4:37pm stating she was returning Marissa's call.

## 2019-07-27 NOTE — Telephone Encounter (Signed)
Reached out to patient via my chart this morning.

## 2019-08-07 NOTE — Telephone Encounter (Signed)
Patient left a message that she was returning your call, and would be available after 4:30pm today.

## 2019-08-07 NOTE — Telephone Encounter (Signed)
Attempted to contact the patient and left message for patient to call the office.  

## 2019-08-10 ENCOUNTER — Encounter: Payer: Self-pay | Admitting: Rheumatology

## 2019-08-10 ENCOUNTER — Telehealth: Payer: Self-pay | Admitting: Rheumatology

## 2019-08-10 DIAGNOSIS — M0579 Rheumatoid arthritis with rheumatoid factor of multiple sites without organ or systems involvement: Secondary | ICD-10-CM

## 2019-08-10 MED ORDER — PREDNISONE 5 MG PO TABS: 5.0000 mg | ORAL_TABLET | Freq: Every day | ORAL | 0 refills | Status: DC

## 2019-08-10 NOTE — Telephone Encounter (Signed)
Patient called stating she was returning your call.  Patient states she has a $7,000 deductible and is unable to afford more labwork until her new insurance starts on 10/06/19.  Patient states she has limited motion in her right hand and is asking if Dr. Estanislado Pandy could increase her Prednisone until she starts her new insurance.

## 2019-08-10 NOTE — Telephone Encounter (Signed)
I spoke with Dr. Estanislado Pandy.  She is ok with the patient staying on prednisone 5 mg daily.  Please advise patient to monitor blood glucose closely while on prednisone.  Also please advise patient to try to establish care with a local rheumatologist for closer monitoring.

## 2019-08-10 NOTE — Telephone Encounter (Signed)
Patient advised Dr. Estanislado Pandy ok with the patient staying on prednisone 5 mg daily.  Patient advised to monitor blood glucose closely while on prednisone. Patient advised to try to establish care with a local rheumatologist for closer monitoring.

## 2019-08-10 NOTE — Telephone Encounter (Signed)
Attempted to contact the patient and left message for patient to call the office.  

## 2019-08-11 ENCOUNTER — Encounter: Payer: Self-pay | Admitting: Rheumatology

## 2019-10-12 ENCOUNTER — Encounter: Payer: Self-pay | Admitting: Family Medicine

## 2019-10-19 ENCOUNTER — Encounter: Payer: Self-pay | Admitting: Rheumatology

## 2021-08-28 ENCOUNTER — Encounter: Payer: Self-pay | Admitting: *Deleted

## 2022-04-20 ENCOUNTER — Encounter: Payer: Self-pay | Admitting: Emergency Medicine

## 2022-04-20 ENCOUNTER — Other Ambulatory Visit: Payer: Self-pay

## 2022-04-20 ENCOUNTER — Observation Stay (HOSPITAL_COMMUNITY)
Admission: EM | Admit: 2022-04-20 | Discharge: 2022-04-21 | Disposition: A | Discharge status: Home / Self Care | Payer: Commercial Managed Care - PPO | Attending: Internal Medicine | Admitting: Internal Medicine

## 2022-04-20 ENCOUNTER — Emergency Department (HOSPITAL_COMMUNITY): Payer: Commercial Managed Care - PPO

## 2022-04-20 ENCOUNTER — Ambulatory Visit
Admission: EM | Admit: 2022-04-20 | Discharge: 2022-04-20 | Disposition: A | Discharge status: Home / Self Care | Payer: Commercial Managed Care - PPO

## 2022-04-20 ENCOUNTER — Encounter (HOSPITAL_COMMUNITY): Payer: Self-pay

## 2022-04-20 DIAGNOSIS — Z79899 Other long term (current) drug therapy: Secondary | ICD-10-CM | POA: Diagnosis not present

## 2022-04-20 DIAGNOSIS — E876 Hypokalemia: Secondary | ICD-10-CM | POA: Diagnosis not present

## 2022-04-20 DIAGNOSIS — I10 Essential (primary) hypertension: Secondary | ICD-10-CM | POA: Diagnosis not present

## 2022-04-20 DIAGNOSIS — L0291 Cutaneous abscess, unspecified: Secondary | ICD-10-CM

## 2022-04-20 DIAGNOSIS — K219 Gastro-esophageal reflux disease without esophagitis: Secondary | ICD-10-CM | POA: Diagnosis present

## 2022-04-20 DIAGNOSIS — E1165 Type 2 diabetes mellitus with hyperglycemia: Secondary | ICD-10-CM | POA: Diagnosis not present

## 2022-04-20 DIAGNOSIS — K611 Rectal abscess: Secondary | ICD-10-CM | POA: Diagnosis not present

## 2022-04-20 DIAGNOSIS — R509 Fever, unspecified: Secondary | ICD-10-CM

## 2022-04-20 DIAGNOSIS — E669 Obesity, unspecified: Secondary | ICD-10-CM | POA: Diagnosis not present

## 2022-04-20 DIAGNOSIS — R000 Tachycardia, unspecified: Secondary | ICD-10-CM | POA: Diagnosis not present

## 2022-04-20 DIAGNOSIS — L03317 Cellulitis of buttock: Secondary | ICD-10-CM | POA: Diagnosis not present

## 2022-04-20 DIAGNOSIS — Z6839 Body mass index (BMI) 39.0-39.9, adult: Secondary | ICD-10-CM | POA: Insufficient documentation

## 2022-04-20 DIAGNOSIS — L0231 Cutaneous abscess of buttock: Secondary | ICD-10-CM

## 2022-04-20 DIAGNOSIS — F411 Generalized anxiety disorder: Secondary | ICD-10-CM | POA: Diagnosis present

## 2022-04-20 DIAGNOSIS — E119 Type 2 diabetes mellitus without complications: Secondary | ICD-10-CM

## 2022-04-20 DIAGNOSIS — M0579 Rheumatoid arthritis with rheumatoid factor of multiple sites without organ or systems involvement: Secondary | ICD-10-CM | POA: Diagnosis present

## 2022-04-20 LAB — CBC WITH DIFFERENTIAL/PLATELET
Abs Immature Granulocytes: 0.04 10*3/uL (ref 0.00–0.07)
Basophils Absolute: 0.1 10*3/uL (ref 0.0–0.1)
Basophils Relative: 1 %
Eosinophils Absolute: 0 10*3/uL (ref 0.0–0.5)
Eosinophils Relative: 0 %
HCT: 36.2 % (ref 36.0–46.0)
Hemoglobin: 12.1 g/dL (ref 12.0–15.0)
Immature Granulocytes: 0 %
Lymphocytes Relative: 13 %
Lymphs Abs: 1.3 10*3/uL (ref 0.7–4.0)
MCH: 27.4 pg (ref 26.0–34.0)
MCHC: 33.4 g/dL (ref 30.0–36.0)
MCV: 82.1 fL (ref 80.0–100.0)
Monocytes Absolute: 0.9 10*3/uL (ref 0.1–1.0)
Monocytes Relative: 9 %
Neutro Abs: 7.8 10*3/uL — ABNORMAL HIGH (ref 1.7–7.7)
Neutrophils Relative %: 77 %
Platelets: 291 10*3/uL (ref 150–400)
RBC: 4.41 MIL/uL (ref 3.87–5.11)
RDW: 13.9 % (ref 11.5–15.5)
WBC: 10 10*3/uL (ref 4.0–10.5)
nRBC: 0 % (ref 0.0–0.2)

## 2022-04-20 LAB — BASIC METABOLIC PANEL
Anion gap: 9 (ref 5–15)
BUN: 14 mg/dL (ref 6–20)
CO2: 27 mmol/L (ref 22–32)
Calcium: 8.9 mg/dL (ref 8.9–10.3)
Chloride: 98 mmol/L (ref 98–111)
Creatinine, Ser: 0.78 mg/dL (ref 0.44–1.00)
GFR, Estimated: >60 mL/min (ref >60)
Glucose, Bld: 194 mg/dL — ABNORMAL HIGH (ref 70–99)
Potassium: 2.9 mmol/L — ABNORMAL LOW (ref 3.5–5.1)
Sodium: 134 mmol/L — ABNORMAL LOW (ref 135–145)

## 2022-04-20 LAB — HEMOGLOBIN A1C
Hgb A1c MFr Bld: 5.9 % — ABNORMAL HIGH (ref 4.8–5.6)
Mean Plasma Glucose: 122.63 mg/dL

## 2022-04-20 LAB — GLUCOSE, CAPILLARY
Glucose-Capillary: 108 mg/dL — ABNORMAL HIGH (ref 70–99)
Glucose-Capillary: 141 mg/dL — ABNORMAL HIGH (ref 70–99)

## 2022-04-20 LAB — LACTIC ACID, PLASMA: Lactic Acid, Venous: 1 mmol/L (ref 0.5–1.9)

## 2022-04-20 LAB — HIV ANTIBODY (ROUTINE TESTING W REFLEX): HIV Screen 4th Generation wRfx: NONREACTIVE

## 2022-04-20 MED ORDER — POTASSIUM CHLORIDE CRYS ER 20 MEQ PO TBCR
40.0000 meq | EXTENDED_RELEASE_TABLET | Freq: Once | ORAL | Status: AC
Start: 2022-04-20 — End: 2022-04-20
  Administered 2022-04-20: 40 meq via ORAL
  Filled 2022-04-20: qty 2

## 2022-04-20 MED ORDER — ONDANSETRON HCL 4 MG/2ML IJ SOLN: 4.0000 mg | Freq: Four times a day (QID) | INTRAMUSCULAR | Status: DC | PRN

## 2022-04-20 MED ORDER — PIPERACILLIN-TAZOBACTAM 3.375 G IVPB
3.3750 g | Freq: Three times a day (TID) | INTRAVENOUS | Status: DC
  Administered 2022-04-20 – 2022-04-21 (×2): 3.375 g via INTRAVENOUS
  Filled 2022-04-20 (×2): qty 50

## 2022-04-20 MED ORDER — VANCOMYCIN HCL 2000 MG/400ML IV SOLN
2000.0000 mg | Freq: Once | INTRAVENOUS | Status: AC
  Administered 2022-04-20: 2000 mg via INTRAVENOUS
  Filled 2022-04-20: qty 400

## 2022-04-20 MED ORDER — SODIUM CHLORIDE 0.9% FLUSH: 3.0000 mL | INTRAVENOUS | Status: DC | PRN

## 2022-04-20 MED ORDER — ONDANSETRON HCL 4 MG PO TABS: 4.0000 mg | ORAL_TABLET | Freq: Four times a day (QID) | ORAL | Status: DC | PRN

## 2022-04-20 MED ORDER — PIPERACILLIN-TAZOBACTAM IN DEX 2-0.25 GM/50ML IV SOLN: 2.2500 g | Freq: Once | INTRAVENOUS | Status: DC

## 2022-04-20 MED ORDER — ACETAMINOPHEN 325 MG PO TABS: 650.0000 mg | ORAL_TABLET | Freq: Four times a day (QID) | ORAL | Status: DC | PRN

## 2022-04-20 MED ORDER — MORPHINE SULFATE (PF) 4 MG/ML IV SOLN
4.0000 mg | Freq: Once | INTRAVENOUS | Status: AC
  Administered 2022-04-20: 4 mg via INTRAVENOUS
  Filled 2022-04-20: qty 1

## 2022-04-20 MED ORDER — SODIUM CHLORIDE 0.9% FLUSH
3.0000 mL | Freq: Two times a day (BID) | INTRAVENOUS | Status: DC
  Administered 2022-04-20 – 2022-04-21 (×3): 3 mL via INTRAVENOUS

## 2022-04-20 MED ORDER — INSULIN ASPART 100 UNIT/ML IJ SOLN: 0.0000 [IU] | Freq: Every day | INTRAMUSCULAR | Status: DC

## 2022-04-20 MED ORDER — SODIUM CHLORIDE 0.9 % IV SOLN: 250.0000 mL | INTRAVENOUS | Status: DC | PRN

## 2022-04-20 MED ORDER — PIPERACILLIN-TAZOBACTAM 3.375 G IVPB
3.3750 g | INTRAVENOUS | Status: AC
  Administered 2022-04-20: 3.375 g via INTRAVENOUS
  Filled 2022-04-20: qty 50

## 2022-04-20 MED ORDER — LOSARTAN POTASSIUM 25 MG PO TABS
12.5000 mg | ORAL_TABLET | Freq: Every day | ORAL | Status: DC
  Administered 2022-04-21: 25 mg via ORAL
  Filled 2022-04-20: qty 1

## 2022-04-20 MED ORDER — IOHEXOL 300 MG/ML  SOLN
100.0000 mL | Freq: Once | INTRAMUSCULAR | Status: AC | PRN
  Administered 2022-04-20: 100 mL via INTRAVENOUS

## 2022-04-20 MED ORDER — POTASSIUM CHLORIDE CRYS ER 20 MEQ PO TBCR
40.0000 meq | EXTENDED_RELEASE_TABLET | Freq: Once | ORAL | Status: AC
  Administered 2022-04-20: 40 meq via ORAL
  Filled 2022-04-20: qty 2

## 2022-04-20 MED ORDER — ONDANSETRON HCL 4 MG/2ML IJ SOLN
4.0000 mg | Freq: Once | INTRAMUSCULAR | Status: AC
  Administered 2022-04-20: 4 mg via INTRAVENOUS
  Filled 2022-04-20: qty 2

## 2022-04-20 MED ORDER — SODIUM CHLORIDE 0.9 % IV BOLUS
1000.0000 mL | Freq: Once | INTRAVENOUS | Status: AC
  Administered 2022-04-20: 1000 mL via INTRAVENOUS

## 2022-04-20 MED ORDER — ACETAMINOPHEN 650 MG RE SUPP: 650.0000 mg | Freq: Four times a day (QID) | RECTAL | Status: DC | PRN

## 2022-04-20 MED ORDER — VANCOMYCIN HCL IN DEXTROSE 1-5 GM/200ML-% IV SOLN
1000.0000 mg | Freq: Two times a day (BID) | INTRAVENOUS | Status: DC
  Administered 2022-04-21: 1000 mg via INTRAVENOUS
  Filled 2022-04-20: qty 200

## 2022-04-20 MED ORDER — INSULIN ASPART 100 UNIT/ML IJ SOLN
0.0000 [IU] | Freq: Three times a day (TID) | INTRAMUSCULAR | Status: DC
  Administered 2022-04-21: 1 [IU] via SUBCUTANEOUS

## 2022-04-20 MED ORDER — ENOXAPARIN SODIUM 60 MG/0.6ML IJ SOSY
60.0000 mg | PREFILLED_SYRINGE | INTRAMUSCULAR | Status: DC
  Administered 2022-04-20: 60 mg via SUBCUTANEOUS
  Filled 2022-04-20: qty 0.6

## 2022-04-20 NOTE — ED Notes (Signed)
Patient is being discharged from the Urgent Care and sent to the Emergency Department via private vehicle . Per PA, patient is in need of higher level of care due to complicated abscess. Patient is aware and verbalizes understanding of plan of care.  Vitals:   04/20/22 0814  BP: 131/79  Pulse: (!) 116  Resp: 18  Temp: 98.3 F (36.8 C)  SpO2: 98%

## 2022-04-20 NOTE — ED Provider Notes (Signed)
[facility]    CSN: [id number] Arrival date & time: 04/20/22  0806      History   Chief Complaint No chief complaint on file.   HPI Kelly [name] is a 61 y.o. female.   Patient presenting today with almost a week of an abscess to the left buttock within the gluteal fold.  She states the area started to drain several days ago small amounts each day, very malodorous per patient.  She had a virtual visit and was placed on clindamycin every 6 hours, today is her third day of this medication.  She states very minimal improvement on this medication and she feels like the area is spreading.  Has had fevers up to 102 degrees, body aches, sweats, chills.  Has not been applying anything topically, keeping a wash rag in her pants to help collect drainage.     Past Medical History:  Diagnosis Date   Allergy    Anxiety    Depression    Diabetes mellitus without complication (HCC)    GERD (gastroesophageal reflux disease)    Headache disorder    Hyperlipidemia    Hypertension    IBS (irritable bowel syndrome)    Obesity    Rheumatoid arthritis ([place])    RA   Salivary calculus    Seasonal allergies     Patient Active Problem List   Diagnosis Date Noted   Bunion 07/28/2018   Rectal bleeding 06/17/2018   Positive colorectal cancer screening using Cologuard test 06/17/2018   GAD (generalized anxiety disorder) 02/19/2018   Psoriasis 01/13/2018   Rheumatoid arthritis involving multiple sites with positive rheumatoid factor ([place]) 11/22/2017   Diabetes mellitus, type II ([place]) 11/04/2017   Bronchial stenosis    Essential hypertension 11/29/2015   Anal fissure, 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   Hemorrhoids, internal 03/04/2013   External hemorrhoids with pain 03/04/2013   Depression    Seasonal allergies    Irritable bowel syndrome with constipation >> diarrhea    Headache disorder    GERD (gastroesophageal reflux disease) 01/28/2013   Chest pain 08/08/2012    Hyperlipidemia 08/08/2012   Morbid obesity ([place]) 08/08/2012    Past Surgical History:  Procedure Laterality Date   ABDOMINAL HYSTERECTOMY  2001   partial hysterectomy   BUNIONECTOMY Left 10/08/2018   Procedure: BUNIONECTOMY LEFT FOOT;  Surgeon: Trula Slade, DPM;  Location: [place];  Service: Podiatry;  Laterality: Left;   COLONOSCOPY WITH PROPOFOL N/A 09/25/2018   Procedure: COLONOSCOPY WITH PROPOFOL;  Surgeon: Daneil Dolin, MD;  [facility];  Service: Endoscopy;  Laterality: N/A;  12:00pm   POLYPECTOMY  09/25/2018   Procedure: POLYPECTOMY;  Surgeon: Daneil Dolin, MD;  [facility];  Service: Endoscopy;;  (colon)   REDUCTION MAMMAPLASTY Bilateral 04/25/2015   SALIVARY GLAND SURGERY     TEMPOROMANDIBULAR JOINT SURGERY     3 times    OB History   No obstetric history on file.      Home Medications    Prior to Admission medications   Medication Sig Start Date End Date Taking? Authorizing Provider  clindamycin (CLEOCIN) 300 MG capsule Take 300 mg by mouth 3 (three) times daily.   Yes [provider]  sulfaSALAzine (AZULFIDINE) 500 MG tablet Take 500 mg by mouth 2 (two) times daily.   Yes [provider]  ACCU-CHEK FASTCLIX LANCETS MISC USE TO MONITOR BLOOD SUGAR 3 TIMES DAILY 03/24/18   Alycia Rossetti, MD  ACCU-CHEK  FASTCLIX LANCETS MISC USE TO MONITOR BLOOD SUGAR 3 TIMES DAILY 09/16/18   Alycia Rossetti, MD  cetirizine (ZYRTEC) 10 MG tablet Take 10 mg by mouth daily.    [provider]  Cyanocobalamin (VITAMIN B-12) 2500 MCG SUBL 1 tablet daily Patient taking differently: Take 2,500 mcg by mouth daily. 1 tablet daily 05/13/18   Alycia Rossetti, MD  diclofenac sodium (VOLTAREN) 1 % GEL Apply 2 g topically 4 (four) times daily as needed (pain). 01/30/19   [place], Modena Nunnery, MD  esomeprazole (NEXIUM) 40 MG capsule TAKE 1 CAPSULE (40 MG TOTAL) BY MOUTH DAILY. 11/17/18   Alycia Rossetti, MD   Fluocinolone Acetonide Scalp 0.01 % OIL Apply 1 application topically daily as needed (flare ups).  01/08/18   [provider]  fluticasone (FLONASE) 50 MCG/ACT nasal spray Place 2 sprays into both nostrils daily. Patient taking differently: Place 2 sprays into both nostrils daily as needed for allergies.  09/03/18   Alycia Rossetti, MD  Garlic 5102 MG CAPS Take 1,000 mg by mouth daily.     [provider]  Glucose Blood (BLOOD GLUCOSE TEST STRIPS) STRP USE TO MONITOR BLOOD SUGAR 3 TIMES DAILY 01/02/19   Alycia Rossetti, MD  hydroxychloroquine (PLAQUENIL) 200 MG tablet Take 1 tablet (200 mg total) by mouth 2 (two) times daily. 07/24/19   Ofilia Neas, PA-C  ibuprofen (ADVIL,MOTRIN) 800 MG tablet TAKE 1 TABLET (800 MG TOTAL) BY MOUTH TWICE A DAY AS NEEDED. 01/19/19   Alycia Rossetti, MD  ipratropium (ATROVENT) 0.03 % nasal spray Place 2 sprays into both nostrils 2 (two) times daily. 01/09/19   Tenna Delaine D, PA-C  losartan (COZAAR) 25 MG tablet Take 0.5 tablets (12.5 mg total) by mouth daily. May take additional dose PO QD if BP remains elevated. 07/15/19   [place], Modena Nunnery, MD  Omega-3 Fatty Acids (FISH OIL PO) Take 1 capsule by mouth 3 (three) times a week.     [provider]  predniSONE (DELTASONE) 5 MG tablet Take 2 tablets by mouth daily x1 wk, 1.5 tablets by mouth daily x1 wk, 1 tablet by mouth daily x1 wk, half tablet by mouth daily x1 wk. 07/24/19   Ofilia Neas, PA-C  predniSONE (DELTASONE) 5 MG tablet Take 1 tablet (5 mg total) by mouth daily with breakfast. 08/10/19   Bo Merino, MD  Prenatal Vit-Fe Fumarate-FA (PRENATAL MULTIVITAMIN) TABS tablet Take 1 tablet by mouth daily at 12 noon.    [provider]  TURMERIC PO Take 1 tablet by mouth daily.     [provider]    Family History Family History  Problem Relation Age of Onset   Heart attack Father        MI at age 62   Coronary artery disease Brother    Diabetes Brother     Coronary artery disease Brother    Heart disease Brother    Lung cancer Mother 45       +TOBACCO, had quit many years before diagnosis   COPD Brother        ?Melanoma   Lung cancer Other    Hypertension Other    Colon cancer Neg Hx     Social History Social History   Tobacco Use   Smoking status: Never   Smokeless tobacco: Never  Vaping Use   Vaping Use: Never used  Substance Use Topics   Alcohol use: No    Alcohol/week: 0.0 standard drinks of alcohol  Drug use: No     Allergies   Codeine, Topamax [topiramate], and Maxalt [rizatriptan]   Review of Systems Review of Systems Per HPI  Physical Exam Triage Vital Signs ED Triage Vitals  Enc Vitals Group     BP 04/20/22 0814 131/79     Pulse Rate 04/20/22 0814 (!) 116     Resp 04/20/22 0814 18     Temp 04/20/22 0814 98.3 F (36.8 C)     Temp Source 04/20/22 0814 Oral     SpO2 04/20/22 0814 98 %     Weight --      Height --      Head Circumference --      Peak Flow --      Pain Score 04/20/22 0816 10     Pain Loc --      Pain Edu? --      Excl. in [place]? --    No data found.  Updated Vital Signs BP 131/79 (BP Location: Right Arm)   Pulse (!) 116   Temp 98.3 F (36.8 C) (Oral)   Resp 18   SpO2 98%   Visual Acuity Right Eye Distance:   Left Eye Distance:   Bilateral Distance:    Right Eye Near:   Left Eye Near:    Bilateral Near:     Physical Exam Vitals and nursing note reviewed.  Constitutional:      Comments: Appears in significant pain  HENT:     Head: Atraumatic.     Mouth/Throat:     Mouth: Mucous membranes are moist.  Eyes:     Extraocular Movements: Extraocular movements intact.     Conjunctiva/sclera: Conjunctivae normal.  Cardiovascular:     Rate and Rhythm: Normal rate and regular rhythm.     Heart sounds: Normal heart sounds.  Pulmonary:     Effort: Pulmonary effort is normal.     Breath sounds: Normal breath sounds.  Musculoskeletal:     Cervical back: Normal range of  motion and neck supple.     Comments: Antalgic movements  Skin:    General: Skin is warm.     Comments: Left gluteal fold with 2 cm diameter ulcerated region with copious granulation tissue, trace drainage, significant surrounding erythema and appreciable tracking upward to a second area of infection toward the gluteal cleft superiorly.  Significant tenderness to palpation in entire region.  Neurological:     Mental Status: She is alert and oriented to person, place, and time.  Psychiatric:        Mood and Affect: Mood normal.        Thought Content: Thought content normal.        Judgment: Judgment normal.      UC Treatments / Results  Labs (all labs ordered are listed, but only abnormal results are displayed) Labs Reviewed - No data to display  EKG   Radiology No results found.  Procedures Procedures (including critical care time)  Medications Ordered in UC Medications - No data to display  Initial Impression / Assessment and Plan / UC Course  I have reviewed the triage vital signs and the nursing notes.  Pertinent labs & imaging results that were available during my care of the patient were reviewed by me and considered in my medical decision making (see chart for details).     Discussed that given progressive worsening despite being on broad-spectrum antibiotics for 2 and half days, unclear depth and extent of infection and persisting fevers and  tachycardia, recommended emergency department for further evaluation and management.  She is agreeable and will have someone take her private vehicle.  She is hemodynamically stable for transport.  Final Clinical Impressions(s) / UC Diagnoses   Final diagnoses:  Gluteal abscess  Fever, unspecified  Tachycardia   Discharge Instructions   None    ED Prescriptions   None    PDMP not reviewed this encounter.   Merrie Roof [place], [place] 04/20/22 [phone]

## 2022-04-20 NOTE — Progress Notes (Signed)
Pharmacy Antibiotic Note  Kelly [name] is a 60 y.o. female admitted on 04/20/2022 with cellulitis.  Pharmacy has been consulted for vancomycin and zosyn dosing.  Plan: Vancomycin 2000 mg IV x 1 dose Vancomycin 1000 mg IV every 12 hours. Zosyn 3.375g IV q8h (4 hour infusion). Monitor labs, c/s, and vanco level as indicated.  Height: '5\' 7"'$  (170.2 cm) Weight: 114.8 kg (253 lb) IBW/kg (Calculated) : 61.6  Temp (24hrs), Avg:98.5 F (36.9 C), Min:98.3 F (36.8 C), Max:98.8 F (37.1 C)  Recent Labs  Lab 04/20/22 0929  WBC 10.0  CREATININE 0.78  LATICACIDVEN 1.0    Estimated Creatinine Clearance: 100.3 mL/min (by C-G formula based on SCr of 0.78 mg/dL).    Allergies  Allergen Reactions   Codeine Nausea Only   Topamax [Topiramate] Nausea Only   Maxalt [Rizatriptan] Nausea Only    Antimicrobials this admission: Vanco 6/16 >> Zosyn 6/16 >>    Microbiology results: 6/16 BCx: pending   Thank you for allowing pharmacy to be a part of this patient's care.  Ramond Craver 04/20/2022 2:01 PM

## 2022-04-20 NOTE — ED Triage Notes (Signed)
Patient arrives to ED POV from Urgent Care in [place] c/o boil "from the vagina, all the way up left buttock." Pt states the boil first appeared on Saturday. UC advised patient to come to ED for further evaluation. Pt a&o, no acute distress.

## 2022-04-20 NOTE — ED Notes (Signed)
Patient ambulated to restroom.

## 2022-04-20 NOTE — Progress Notes (Signed)
PHARMACIST - PHYSICIAN COMMUNICATION  CONCERNING:  Enoxaparin (Lovenox) for DVT Prophylaxis    RECOMMENDATION: Patient was prescribed enoxaprin '40mg'$  q24 hours for VTE prophylaxis.   Filed Weights   04/20/22 0905  Weight: 114.8 kg (253 lb)    Body mass index is 39.63 kg/m.  Estimated Creatinine Clearance: 100.3 mL/min (by C-G formula based on SCr of 0.78 mg/dL).  Based on [place] patient is candidate for enoxaparin 0.'5mg'$ /kg TBW SQ every 24 hours based on BMI being >30.  DESCRIPTION: Pharmacy has adjusted enoxaparin dose per [facility] policy.  Patient is now receiving enoxaparin 60 mg every 24 hours    Ena Dawley, PharmD Clinical Pharmacist  04/20/2022 1:47 PM

## 2022-04-20 NOTE — Consult Note (Signed)
Reason for Consult: Left buttock abscess Referring Physician: Dr. Barrie Kelly [name] is an 61 y.o. [name].  HPI: Patient is a 61 year old white [name] with multiple medical problems who was started on clindamycin for perirectal abscess.  It started draining several days ago, but she presented to the emergency room due to recommendation that she be seen and evaluated due to her immunocompromise state from having rheumatoid arthritis.  She denies any fever or chills.  She does have a sore left buttock cheek.  She denies any air or stool passing through the opening.  Past Medical History:  Diagnosis Date   Allergy    Anxiety    Depression    Diabetes mellitus without complication (HCC)    GERD (gastroesophageal reflux disease)    Headache disorder    Hyperlipidemia    Hypertension    IBS (irritable bowel syndrome)    Obesity    Rheumatoid arthritis ([place])    RA   Salivary calculus    Seasonal allergies     Past Surgical History:  Procedure Laterality Date   ABDOMINAL HYSTERECTOMY  2001   partial hysterectomy   BUNIONECTOMY Left 10/08/2018   Procedure: BUNIONECTOMY LEFT FOOT;  Surgeon: Trula Slade, DPM;  Location: [place];  Service: Podiatry;  Laterality: Left;   COLONOSCOPY WITH PROPOFOL N/A 09/25/2018   Procedure: COLONOSCOPY WITH PROPOFOL;  Surgeon: Daneil Dolin, MD;  [facility];  Service: Endoscopy;  Laterality: N/A;  12:00pm   POLYPECTOMY  09/25/2018   Procedure: POLYPECTOMY;  Surgeon: Daneil Dolin, MD;  [facility];  Service: Endoscopy;;  (colon)   REDUCTION MAMMAPLASTY Bilateral 04/25/2015   SALIVARY GLAND SURGERY     TEMPOROMANDIBULAR JOINT SURGERY     3 times    Family History  Problem Relation Age of Onset   Heart attack Father        MI at age 61   Coronary artery disease Brother    Diabetes Brother    Coronary artery disease Brother    Heart disease Brother    Lung cancer Mother 59       +TOBACCO, had quit  many years before diagnosis   COPD Brother        ?Melanoma   Lung cancer Other    Hypertension Other    Colon cancer Neg Hx     Social History:  reports that she has never smoked. She has never used smokeless tobacco. She reports that she does not drink alcohol and does not use drugs.  Allergies:  Allergies  Allergen Reactions   Codeine Nausea Only   Topamax [Topiramate] Nausea Only   Maxalt [Rizatriptan] Nausea Only    Medications: I have reviewed the patient's current medications.  Results for orders placed or performed during the hospital encounter of 04/20/22 (from the past 48 hour(s))  Blood culture (routine x 2)     Status: None (Preliminary result)   Collection Time: 04/20/22  9:21 AM   Specimen: Right Antecubital; Blood  Result Value Ref Range   Specimen Description RIGHT ANTECUBITAL    Special Requests      BOTTLES DRAWN AEROBIC AND ANAEROBIC Blood Culture results may not be optimal due to an excessive volume of blood received in culture bottles Performed at [facility], [address], [place], [place] [address]    Culture PENDING    Report Status PENDING   CBC with Differential     Status: Abnormal   Collection Time:  04/20/22  9:29 AM  Result Value Ref Range   WBC 10.0 4.0 - 10.5 K/uL   RBC 4.41 3.87 - 5.11 MIL/uL   Hemoglobin 12.1 12.0 - 15.0 g/dL   HCT 36.2 36.0 - 46.0 %   MCV 82.1 80.0 - 100.0 fL   MCH 27.4 26.0 - 34.0 pg   MCHC 33.4 30.0 - 36.0 g/dL   RDW 13.9 11.5 - 15.5 %   Platelets 291 150 - 400 K/uL   nRBC 0.0 0.0 - 0.2 %   Neutrophils Relative % 77 %   Neutro Abs 7.8 (H) 1.7 - 7.7 K/uL   Lymphocytes Relative 13 %   Lymphs Abs 1.3 0.7 - 4.0 K/uL   Monocytes Relative 9 %   Monocytes Absolute 0.9 0.1 - 1.0 K/uL   Eosinophils Relative 0 %   Eosinophils Absolute 0.0 0.0 - 0.5 K/uL   Basophils Relative 1 %   Basophils Absolute 0.1 0.0 - 0.1 K/uL   Immature Granulocytes 0 %   Abs Immature Granulocytes 0.04 0.00 - 0.07 K/uL    Comment: Performed  at [facility], [address] [place], [place] [address]  Basic metabolic panel     Status: Abnormal   Collection Time: 04/20/22  9:29 AM  Result Value Ref Range   Sodium 134 (L) 135 - 145 mmol/L   Potassium 2.9 (L) 3.5 - 5.1 mmol/L   Chloride 98 98 - 111 mmol/L   CO2 27 22 - 32 mmol/L   Glucose, Bld 194 (H) 70 - 99 mg/dL    Comment: Glucose reference range applies only to samples taken after fasting for at least 8 hours.   BUN 14 6 - 20 mg/dL   Creatinine, Ser 0.78 0.44 - 1.00 mg/dL   Calcium 8.9 8.9 - 10.3 mg/dL   GFR, Estimated >60 >60 mL/min    Comment: (NOTE) Calculated using the CKD-EPI Creatinine Equation (2021)    Anion gap 9 5 - 15    Comment: Performed at [facility], [address], [place], [place] [address]  Lactic acid, plasma     Status: None   Collection Time: 04/20/22  9:29 AM  Result Value Ref Range   Lactic Acid, Venous 1.0 0.5 - 1.9 mmol/L    Comment: Performed at [facility], [address], [place], [place] [address]  Blood culture (routine x 2)     Status: None (Preliminary result)   Collection Time: 04/20/22  9:29 AM   Specimen: Left Antecubital; Blood  Result Value Ref Range   Specimen Description LEFT ANTECUBITAL    Special Requests      BOTTLES DRAWN AEROBIC AND ANAEROBIC Blood Culture results may not be optimal due to an excessive volume of blood received in culture bottles Performed at [facility], [address], [place], [place] [address]    Culture PENDING    Report Status PENDING     CT PELVIS W CONTRAST  Result Date: 04/20/2022 CLINICAL DATA:  Inter rectal abscess. EXAM: CT PELVIS WITH CONTRAST TECHNIQUE: Multidetector CT imaging of the pelvis was performed using the standard protocol following the bolus administration of intravenous contrast. RADIATION DOSE REDUCTION: This exam was performed according to the departmental dose-optimization program which includes automated exposure control, adjustment of the mA and/or kV according to patient  size and/or use of iterative reconstruction technique. CONTRAST:  19m OMNIPAQUE IOHEXOL 300 MG/ML  SOLN COMPARISON:  None Available. FINDINGS: Urinary Tract:  No abnormality visualized. Bowel:  Unremarkable visualized pelvic bowel loops. Vascular/Lymphatic:  No pathologically enlarged lymph nodes. No significant vascular abnormality seen. Reproductive:  Prior hysterectomy. Other: Cellulitis in the medial aspect of the left buttock with soft tissue emphysema extending to the left posterolateral aspect of the rectum. No drainable fluid collection. Musculoskeletal: No suspicious bone lesions identified. Osteoarthritis of bilateral SI joints. Mild degenerative disease with disc height loss at L3-4 and L4-5 with bilateral facet arthropathy. IMPRESSION: 1. Cellulitis in the medial aspect of the left buttock with soft tissue emphysema extending to the left posterolateral aspect of the rectum concerning for necrotizing infection. No drainable fluid collection. Electronically Signed   By: Kathreen Devoid M.D.   On: 04/20/2022 11:22    ROS:  Pertinent items are noted in HPI.  Blood pressure 125/Kelly, pulse (!) 109, temperature 98.5 F (36.9 C), temperature source Oral, resp. rate 18, height '5\' 7"'$  (1.702 m), weight 114.8 kg, SpO2 97 %. Physical Exam: Pleasant white [name] no acute distress.  She is lying on her right side. Head is normocephalic, atraumatic Buttock examination reveals irritated, erythematous 3 cm opening with superficial necrotic tissue present.  This was sharply debrided using scissors.  I did probe the wound and we did not connect to the rectum or go deep into the left buttock subcutaneous tissue.  It appeared more superficial in nature.  No loculations were present.  CT scanning images personally reviewed  Assessment/Plan: Left buttock perirectal abscess, already draining.  No need for acute surgical invention at this time.  This does not appear to be Fournier's gangrene.  She appears to be stable  with a normal white blood cell count and lactic acid level.  She can either be admitted by the hospitalist for further IV antibiotic therapy or can be discharged with local wound care on sitz bath's and continuing her clindamycin.  I will see her in follow-up next week as needed.  Aviva Signs 04/20/2022, 11:29 AM

## 2022-04-20 NOTE — ED Notes (Signed)
Patient transported to CT 

## 2022-04-20 NOTE — ED Triage Notes (Signed)
Boil on left buttocks since Saturday.  States the area started to drain two days ago.  Had a virtual visit and was placed on clindamycin '300mg'$ .  Patients states the area is still painful

## 2022-04-20 NOTE — H&P (Signed)
History and Physical    Kelly [name] XKG:[id number] DOB: 06-Dec-1960 DOA: 04/20/2022  PCP: Pcp, No   Patient coming from: Home  Chief Complaint: Perirectal abscess  HPI: Kelly [name] is a 61 y.o. female with medical history significant for obesity, hypertension, prediabetes, rheumatoid arthritis, anxiety/depression, and GERD who presented to the ED with worsening perirectal abscess/left buttock cellulitis for which she was started on clindamycin 3 days ago.  This appears to be worsening and she was having some increasing pain.  She was told to come to the ED on account of her worsening symptoms along with immunocompromise in the setting of rheumatoid arthritis.  No fevers or chills noted and she has had some scant ongoing drainage since the initiation of this abscess.   ED Course: Vital signs stable and patient afebrile.  Sodium 134 potassium 2.9 and blood glucose 194.  CT of the pelvic area demonstrates some findings of necrotizing infection, but with no drainable abscess.  Seen by general surgery in the ED with bedside debridement performed and now noted to have some ongoing drainage.  Started on IV vancomycin as well as Zosyn.  Potassium repletion ordered.  Review of Systems: Reviewed as noted above, otherwise negative.  Past Medical History:  Diagnosis Date   Allergy    Anxiety    Depression    Diabetes mellitus without complication (HCC)    GERD (gastroesophageal reflux disease)    Headache disorder    Hyperlipidemia    Hypertension    IBS (irritable bowel syndrome)    Obesity    Rheumatoid arthritis ([place])    RA   Salivary calculus    Seasonal allergies     Past Surgical History:  Procedure Laterality Date   ABDOMINAL HYSTERECTOMY  2001   partial hysterectomy   BUNIONECTOMY Left 10/08/2018   Procedure: BUNIONECTOMY LEFT FOOT;  Surgeon: Trula Slade, DPM;  Location: [facility];  Service: Podiatry;  Laterality: Left;   COLONOSCOPY WITH PROPOFOL N/A  09/25/2018   Procedure: COLONOSCOPY WITH PROPOFOL;  Surgeon: Daneil Dolin, MD;  [facility];  Service: Endoscopy;  Laterality: N/A;  12:00pm   POLYPECTOMY  09/25/2018   Procedure: POLYPECTOMY;  Surgeon: Daneil Dolin, MD;  [facility];  Service: Endoscopy;;  (colon)   REDUCTION MAMMAPLASTY Bilateral 04/25/2015   SALIVARY GLAND SURGERY     TEMPOROMANDIBULAR JOINT SURGERY     3 times     reports that she has never smoked. She has never used smokeless tobacco. She reports that she does not drink alcohol and does not use drugs.  Allergies  Allergen Reactions   Codeine Nausea Only   Topamax [Topiramate] Nausea Only   Maxalt [Rizatriptan] Nausea Only    Family History  Problem Relation Age of Onset   Heart attack Father        MI at age 64   Coronary artery disease Brother    Diabetes Brother    Coronary artery disease Brother    Heart disease Brother    Lung cancer Mother 28       +TOBACCO, had quit many years before diagnosis   COPD Brother        ?Melanoma   Lung cancer Other    Hypertension Other    Colon cancer Neg Hx     Prior to Admission medications   Medication Sig Start Date End Date Taking? Authorizing Provider  clindamycin (CLEOCIN) 300 MG capsule Take 300 mg by mouth 3 (three)  times daily.   Yes [provider]  ibuprofen (ADVIL,MOTRIN) 800 MG tablet TAKE 1 TABLET (800 MG TOTAL) BY MOUTH TWICE A DAY AS NEEDED. Patient taking differently: Take 800 mg by mouth every 6 (six) hours as needed for headache or mild pain. 01/19/19  Yes [place], Modena Nunnery, MD  losartan (COZAAR) 25 MG tablet Take 0.5 tablets (12.5 mg total) by mouth daily. May take additional dose PO QD if BP remains elevated. Patient taking differently: Take 12.5-25 mg by mouth daily. '25mg'$  in the morning and 12.'5mg'$  at night time 07/15/19  Yes [place], Modena Nunnery, MD  sulfaSALAzine (AZULFIDINE) 500 MG tablet Take 500 mg by mouth 2 (two) times daily.   Yes [provider]  ACCU-CHEK FASTCLIX LANCETS MISC USE TO MONITOR BLOOD SUGAR 3 TIMES DAILY 03/24/18   [place], Modena Nunnery, MD  ACCU-CHEK FASTCLIX LANCETS MISC USE TO MONITOR BLOOD SUGAR 3 TIMES DAILY 09/16/18   [place], Modena Nunnery, MD  Cyanocobalamin (VITAMIN B-12) 2500 MCG SUBL 1 tablet daily Patient not taking: Reported on 04/20/2022 05/13/18   Alycia Rossetti, MD  diclofenac sodium (VOLTAREN) 1 % GEL Apply 2 g topically 4 (four) times daily as needed (pain). Patient not taking: Reported on 04/20/2022 01/30/19   Alycia Rossetti, MD  esomeprazole (NEXIUM) 40 MG capsule TAKE 1 CAPSULE (40 MG TOTAL) BY MOUTH DAILY. Patient not taking: Reported on 04/20/2022 11/17/18   Alycia Rossetti, MD  fluticasone [facility]) 50 MCG/ACT nasal spray Place 2 sprays into both nostrils daily. Patient not taking: Reported on 04/20/2022 09/03/18   Alycia Rossetti, MD  Glucose Blood (BLOOD GLUCOSE TEST STRIPS) STRP USE TO MONITOR BLOOD SUGAR 3 TIMES DAILY 01/02/19   Alycia Rossetti, MD  hydroxychloroquine (PLAQUENIL) 200 MG tablet Take 1 tablet (200 mg total) by mouth 2 (two) times daily. Patient not taking: Reported on 04/20/2022 07/24/19   Ofilia Neas, PA-C  ipratropium (ATROVENT) 0.03 % nasal spray Place 2 sprays into both nostrils 2 (two) times daily. Patient not taking: Reported on 04/20/2022 01/09/19   Tenna Delaine D, PA-C  predniSONE (DELTASONE) 5 MG tablet Take 2 tablets by mouth daily x1 wk, 1.5 tablets by mouth daily x1 wk, 1 tablet by mouth daily x1 wk, half tablet by mouth daily x1 wk. Patient not taking: Reported on 04/20/2022 07/24/19   Ofilia Neas, PA-C  predniSONE (DELTASONE) 5 MG tablet Take 1 tablet (5 mg total) by mouth daily with breakfast. Patient not taking: Reported on 04/20/2022 08/10/19   Bo Merino, MD    Physical Exam: Vitals:   04/20/22 0900 04/20/22 0905 04/20/22 1235 04/20/22 1300  BP: 125/75  102/87 137/85  Pulse: (!) 109  95 97  Resp: 18  18   Temp: 98.5 F (36.9 C)     TempSrc: Oral      SpO2: 97%  96% 98%  Weight:  114.8 kg    Height:  '5\' 7"'$  (0.932 m)      Constitutional: NAD, calm, comfortable, obese Vitals:   04/20/22 0900 04/20/22 0905 04/20/22 1235 04/20/22 1300  BP: 125/75  102/87 137/85  Pulse: (!) 109  95 97  Resp: 18  18   Temp: 98.5 F (36.9 C)     TempSrc: Oral     SpO2: 97%  96% 98%  Weight:  114.8 kg    Height:  '5\' 7"'$  (1.702 m)     Eyes: lids and conjunctivae normal Neck: normal, supple Respiratory: clear to auscultation bilaterally. Normal respiratory  effort. No accessory muscle use.  Cardiovascular: Regular rate and rhythm, no murmurs. Abdomen: no tenderness, no distention. Bowel sounds positive.  Musculoskeletal:  No edema. Skin: Left buttock erythematous with 3 cm opening and superficial necrotic tissue present. Psychiatric: Flat affect  Labs on Admission: I have personally reviewed following labs and imaging studies  CBC: Recent Labs  Lab 04/20/22 0929  WBC 10.0  NEUTROABS 7.8*  HGB 12.1  HCT 36.2  MCV 82.1  PLT 867   Basic Metabolic Panel: Recent Labs  Lab 04/20/22 0929  NA 134*  K 2.9*  CL 98  CO2 27  GLUCOSE 194*  BUN 14  CREATININE 0.78  CALCIUM 8.9   GFR: Estimated Creatinine Clearance: 100.3 mL/min (by C-G formula based on SCr of 0.78 mg/dL). Liver Function Tests: No results for input(s): "AST", "ALT", "ALKPHOS", "BILITOT", "PROT", "ALBUMIN" in the last 168 hours. No results for input(s): "LIPASE", "AMYLASE" in the last 168 hours. No results for input(s): "AMMONIA" in the last 168 hours. Coagulation Profile: No results for input(s): "INR", "PROTIME" in the last 168 hours. Cardiac Enzymes: No results for input(s): "CKTOTAL", "CKMB", "CKMBINDEX", "TROPONINI" in the last 168 hours. BNP (last 3 results) No results for input(s): "PROBNP" in the last 8760 hours. HbA1C: No results for input(s): "HGBA1C" in the last 72 hours. CBG: No results for input(s): "GLUCAP" in the last 168 hours. Lipid Profile: No results  for input(s): "CHOL", "HDL", "LDLCALC", "TRIG", "CHOLHDL", "LDLDIRECT" in the last 72 hours. Thyroid Function Tests: No results for input(s): "TSH", "T4TOTAL", "FREET4", "T3FREE", "THYROIDAB" in the last 72 hours. Anemia Panel: No results for input(s): "VITAMINB12", "FOLATE", "FERRITIN", "TIBC", "IRON", "RETICCTPCT" in the last 72 hours. Urine analysis:    Component Value Date/Time   COLORURINE YELLOW 11/15/2017 [place] 11/15/2017 0853   LABSPEC 1.012 11/15/2017 0853   PHURINE < OR = 5.0 11/15/2017 0853   GLUCOSEU NEGATIVE 11/15/2017 0853   HGBUR NEGATIVE 11/15/2017 0853   KETONESUR NEGATIVE 11/15/2017 0853   PROTEINUR NEGATIVE 11/15/2017 0853   NITRITE NEGATIVE 11/15/2017 0853   LEUKOCYTESUR NEGATIVE 11/15/2017 0853    Radiological Exams on Admission: CT PELVIS W CONTRAST  Result Date: 04/20/2022 CLINICAL DATA:  Inter rectal abscess. EXAM: CT PELVIS WITH CONTRAST TECHNIQUE: Multidetector CT imaging of the pelvis was performed using the standard protocol following the bolus administration of intravenous contrast. RADIATION DOSE REDUCTION: This exam was performed according to the departmental dose-optimization program which includes automated exposure control, adjustment of the mA and/or kV according to patient size and/or use of iterative reconstruction technique. CONTRAST:  147m OMNIPAQUE IOHEXOL 300 MG/ML  SOLN COMPARISON:  None Available. FINDINGS: Urinary Tract:  No abnormality visualized. Bowel:  Unremarkable visualized pelvic bowel loops. Vascular/Lymphatic: No pathologically enlarged lymph nodes. No significant vascular abnormality seen. Reproductive:  Prior hysterectomy. Other: Cellulitis in the medial aspect of the left buttock with soft tissue emphysema extending to the left posterolateral aspect of the rectum. No drainable fluid collection. Musculoskeletal: No suspicious bone lesions identified. Osteoarthritis of bilateral SI joints. Mild degenerative disease with  disc height loss at L3-4 and L4-5 with bilateral facet arthropathy. IMPRESSION: 1. Cellulitis in the medial aspect of the left buttock with soft tissue emphysema extending to the left posterolateral aspect of the rectum concerning for necrotizing infection. No drainable fluid collection. Electronically Signed   By: HKathreen DevoidM.D.   On: 04/20/2022 11:22     Assessment/Plan Principal Problem:   Peri-rectal abscess Active Problems:   GERD (gastroesophageal reflux disease)  Essential hypertension   Diabetes mellitus, type II (HCC)   Rheumatoid arthritis involving multiple sites with positive rheumatoid factor (HCC)   GAD (generalized anxiety disorder)   Obesity (BMI 30-39.9)    Perirectal abscess/cellulitis to left buttock -Concern for immunocompromise in the setting of rheumatoid arthritis and lack of improvement with outpatient clindamycin -Appreciate general surgery evaluation with bedside debridement -Plan to maintain on IV Zosyn and vancomycin as ordered and follow cultures -Anticipate discharge with oral clindamycin in the next 24-48 hours pending improvement -Follow CBC  Hypokalemia -Replete and reevaluate in a.m.  Rheumatoid arthritis -Hold home sulfasalazine and Plaquenil -Hold prednisone  Hypertension -Continue losartan  GERD -PPI  Prediabetes with hyperglycemia -Maintain on carb modified diet -SSI -Hemoglobin A1c  Obesity -BMI 39.63 -Lifestyle changes outpatient   DVT prophylaxis: Lovenox Code Status: Full Family Communication: None at bedside Disposition Plan:Admit for IV abx Consults called:General Surgery Admission status: Obs, Med/Surg  Severity of Illness: The appropriate patient status for this patient is OBSERVATION. Observation status is judged to be reasonable and necessary in order to provide the required intensity of service to ensure the patient's safety. The patient's presenting symptoms, physical exam findings, and initial radiographic and  laboratory data in the context of their medical condition is felt to place them at decreased risk for further clinical deterioration. Furthermore, it is anticipated that the patient will be medically stable for discharge from the hospital within 2 midnights of admission.    Glyn Gerads D Manuella Ghazi DO [facility]  If 7PM-7AM, please contact night-coverage [email or web address]  04/20/2022, 1:34 PM

## 2022-04-20 NOTE — ED Provider Notes (Signed)
[facility] Provider Note   CSN: [id number] Arrival date & time: 04/20/22  0849     History  Chief Complaint  Patient presents with   Abscess    Kelly [name] is a 61 y.o. female.  The history is provided by the patient.  Abscess Location:  Pelvis Pelvic abscess location:  L buttock and anus Abscess quality: draining   Red streaking: no   Duration:  1 week Progression:  Worsening Chronicity:  New Context: diabetes   Relieved by:  Nothing Ineffective treatments: currently on day 3 of clindamycin, but the infection is getting larger. Associated symptoms: fever   Associated symptoms: no nausea   Associated symptoms comment:  Reports fever to 102 yesterday.   Risk factors: no hx of MRSA    Patient was seen at a urgent care center this morning and was sent here for further evaluation.  She had had a TeleDoc visit 3 days ago at which time she was placed on clindamycin.    Home Medications Prior to Admission medications   Medication Sig Start Date End Date Taking? Authorizing Provider  clindamycin (CLEOCIN) 300 MG capsule Take 300 mg by mouth 3 (three) times daily.   Yes [provider]  ibuprofen (ADVIL,MOTRIN) 800 MG tablet TAKE 1 TABLET (800 MG TOTAL) BY MOUTH TWICE A DAY AS NEEDED. Patient taking differently: Take 800 mg by mouth every 6 (six) hours as needed for headache or mild pain. 01/19/19  Yes [place], Modena Nunnery, MD  losartan (COZAAR) 25 MG tablet Take 0.5 tablets (12.5 mg total) by mouth daily. May take additional dose PO QD if BP remains elevated. Patient taking differently: Take 12.5-25 mg by mouth daily. '25mg'$  in the morning and 12.'5mg'$  at night time 07/15/19  Yes [place], Modena Nunnery, MD  sulfaSALAzine (AZULFIDINE) 500 MG tablet Take 500 mg by mouth 2 (two) times daily.   Yes [provider]  ACCU-CHEK FASTCLIX LANCETS MISC USE TO MONITOR BLOOD SUGAR 3 TIMES DAILY 03/24/18   [place], Modena Nunnery, MD  ACCU-CHEK FASTCLIX LANCETS MISC USE  TO MONITOR BLOOD SUGAR 3 TIMES DAILY 09/16/18   [place], Modena Nunnery, MD  Cyanocobalamin (VITAMIN B-12) 2500 MCG SUBL 1 tablet daily Patient not taking: Reported on 04/20/2022 05/13/18   Alycia Rossetti, MD  diclofenac sodium (VOLTAREN) 1 % GEL Apply 2 g topically 4 (four) times daily as needed (pain). Patient not taking: Reported on 04/20/2022 01/30/19   Alycia Rossetti, MD  esomeprazole (NEXIUM) 40 MG capsule TAKE 1 CAPSULE (40 MG TOTAL) BY MOUTH DAILY. Patient not taking: Reported on 04/20/2022 11/17/18   Alycia Rossetti, MD  fluticasone [facility]) 50 MCG/ACT nasal spray Place 2 sprays into both nostrils daily. Patient not taking: Reported on 04/20/2022 09/03/18   Alycia Rossetti, MD  Glucose Blood (BLOOD GLUCOSE TEST STRIPS) STRP USE TO MONITOR BLOOD SUGAR 3 TIMES DAILY 01/02/19   Alycia Rossetti, MD  hydroxychloroquine (PLAQUENIL) 200 MG tablet Take 1 tablet (200 mg total) by mouth 2 (two) times daily. Patient not taking: Reported on 04/20/2022 07/24/19   Ofilia Neas, PA-C  ipratropium (ATROVENT) 0.03 % nasal spray Place 2 sprays into both nostrils 2 (two) times daily. Patient not taking: Reported on 04/20/2022 01/09/19   Tenna Delaine D, PA-C  predniSONE (DELTASONE) 5 MG tablet Take 2 tablets by mouth daily x1 wk, 1.5 tablets by mouth daily x1 wk, 1 tablet by mouth daily x1 wk, half tablet by mouth daily x1 wk. Patient not  taking: Reported on 04/20/2022 07/24/19   Ofilia Neas, PA-C  predniSONE (DELTASONE) 5 MG tablet Take 1 tablet (5 mg total) by mouth daily with breakfast. Patient not taking: Reported on 04/20/2022 08/10/19   Bo Merino, MD      Allergies    Codeine, Topamax [topiramate], and Maxalt [rizatriptan]    Review of Systems   Review of Systems  Constitutional:  Positive for chills and fever.  HENT: Negative.    Eyes: Negative.   Respiratory:  Negative for chest tightness and shortness of breath.   Cardiovascular:  Negative for chest pain.  Gastrointestinal:   Negative for abdominal pain and nausea.  Genitourinary: Negative.   Musculoskeletal: Negative.   Skin:  Positive for color change. Negative for rash.  Neurological: Negative.   Psychiatric/Behavioral: Negative.      Physical Exam Updated Vital Signs BP 102/87 (BP Location: Right Arm)   Pulse 95   Temp 98.5 F (36.9 C) (Oral)   Resp 18   Ht '5\' 7"'$  (1.702 m)   Wt 114.8 kg   SpO2 96%   BMI 39.63 kg/m  Physical Exam Vitals and nursing note reviewed.  Constitutional:      Appearance: She is well-developed.  HENT:     Head: Normocephalic and atraumatic.  Eyes:     Conjunctiva/sclera: Conjunctivae normal.  Cardiovascular:     Rate and Rhythm: Regular rhythm. Tachycardia present.     Heart sounds: Normal heart sounds.  Pulmonary:     Effort: Pulmonary effort is normal.     Breath sounds: No wheezing.  Abdominal:     Palpations: Abdomen is soft.     Tenderness: There is no abdominal tenderness.  Musculoskeletal:        General: Normal range of motion.     Cervical back: Normal range of motion.  Skin:    General: Skin is warm and dry.     Comments: Induration and erythema along the medial left buttock with focus of tenacious purulent discharge at drainage site perianal region.  Neurological:     Mental Status: She is alert.     ED Results / Procedures / Treatments   Labs (all labs ordered are listed, but only abnormal results are displayed) Labs Reviewed  CBC WITH DIFFERENTIAL/PLATELET - Abnormal; Notable for the following components:      Result Value   Neutro Abs 7.8 (*)    All other components within normal limits  BASIC METABOLIC PANEL - Abnormal; Notable for the following components:   Sodium 134 (*)    Potassium 2.9 (*)    Glucose, Bld 194 (*)    All other components within normal limits  CULTURE, BLOOD (ROUTINE X 2)  CULTURE, BLOOD (ROUTINE X 2)  LACTIC ACID, PLASMA    EKG None  Radiology CT PELVIS W CONTRAST  Result Date: 04/20/2022 CLINICAL DATA:   Inter rectal abscess. EXAM: CT PELVIS WITH CONTRAST TECHNIQUE: Multidetector CT imaging of the pelvis was performed using the standard protocol following the bolus administration of intravenous contrast. RADIATION DOSE REDUCTION: This exam was performed according to the departmental dose-optimization program which includes automated exposure control, adjustment of the mA and/or kV according to patient size and/or use of iterative reconstruction technique. CONTRAST:  137m OMNIPAQUE IOHEXOL 300 MG/ML  SOLN COMPARISON:  None Available. FINDINGS: Urinary Tract:  No abnormality visualized. Bowel:  Unremarkable visualized pelvic bowel loops. Vascular/Lymphatic: No pathologically enlarged lymph nodes. No significant vascular abnormality seen. Reproductive:  Prior hysterectomy. Other: Cellulitis in the medial  aspect of the left buttock with soft tissue emphysema extending to the left posterolateral aspect of the rectum. No drainable fluid collection. Musculoskeletal: No suspicious bone lesions identified. Osteoarthritis of bilateral SI joints. Mild degenerative disease with disc height loss at L3-4 and L4-5 with bilateral facet arthropathy. IMPRESSION: 1. Cellulitis in the medial aspect of the left buttock with soft tissue emphysema extending to the left posterolateral aspect of the rectum concerning for necrotizing infection. No drainable fluid collection. Electronically Signed   By: Kathreen Devoid M.D.   On: 04/20/2022 11:22    Procedures Procedures    Medications Ordered in ED Medications  vancomycin (VANCOREADY) IVPB 2000 mg/400 mL (has no administration in time range)  piperacillin-tazobactam (ZOSYN) IVPB 3.375 g (3.375 g Intravenous New Bag/Given 04/20/22 1228)  sodium chloride 0.9 % bolus 1,000 mL (1,000 mLs Intravenous New Bag/Given 04/20/22 0941)  morphine (PF) 4 MG/ML injection 4 mg (4 mg Intravenous Given 04/20/22 0938)  ondansetron (ZOFRAN) injection 4 mg (4 mg Intravenous Given 04/20/22 0938)  iohexol  (OMNIPAQUE) 300 MG/ML solution 100 mL (100 mLs Intravenous Contrast Given 04/20/22 1052)  potassium chloride SA (KLOR-CON M) CR tablet 40 mEq (40 mEq Oral Given 04/20/22 1228)    ED Course/ Medical Decision Making/ A&P                           Medical Decision Making Patient with a worsening perirectal abscess/cellulitis despite having 3 days of oral clindamycin.  She reports fever yesterday, she is afebrile today.  Dr. Arnoldo Morale of general surgery was in the department so asked to see this patient who debrided the abscess site, refer to his note on the chart.  CT imaging suggesting a possible necrotizing infection, given patient has failed outpatient oral antibiotic therapy she would most benefit from admission and IV antibiotics.  She was given an initial dose of vancomycin and Zosyn here.  Patient is agreeable to admission.  Amount and/or Complexity of Data Reviewed Labs: ordered.    Details: Her WBC count is normal at 10.0 which is reassuring.  Blood cultures have been collected.  She has a normal lactate at 1.0.  She does have a hypokalemia with a potassium of 2.9.  She was given oral potassium 40 mill equivalents. Radiology: ordered. Discussion of management or test interpretation with external provider(s): Discussed with Dr. Manuella Ghazi of the hospitalist service who accepts patient for admission/overnight observation.  Risk Prescription drug management. Decision regarding hospitalization.           Final Clinical Impression(s) / ED Diagnoses Final diagnoses:  Abscess  Hypokalemia  Cellulitis of buttock    Rx / DC Orders ED Discharge Orders     None         Landis Martins 04/20/22 1242    Luna Fuse, MD 04/23/22 1701

## 2022-04-20 NOTE — Plan of Care (Signed)

## 2022-04-21 DIAGNOSIS — K611 Rectal abscess: Secondary | ICD-10-CM | POA: Diagnosis not present

## 2022-04-21 LAB — BASIC METABOLIC PANEL
Anion gap: 8 (ref 5–15)
BUN: 10 mg/dL (ref 6–20)
CO2: 26 mmol/L (ref 22–32)
Calcium: 8.6 mg/dL — ABNORMAL LOW (ref 8.9–10.3)
Chloride: 104 mmol/L (ref 98–111)
Creatinine, Ser: 0.61 mg/dL (ref 0.44–1.00)
GFR, Estimated: >60 mL/min (ref >60)
Glucose, Bld: 186 mg/dL — ABNORMAL HIGH (ref 70–99)
Potassium: 3.5 mmol/L (ref 3.5–5.1)
Sodium: 138 mmol/L (ref 135–145)

## 2022-04-21 LAB — CBC
HCT: 32.6 % — ABNORMAL LOW (ref 36.0–46.0)
Hemoglobin: 10.6 g/dL — ABNORMAL LOW (ref 12.0–15.0)
MCH: 27.5 pg (ref 26.0–34.0)
MCHC: 32.5 g/dL (ref 30.0–36.0)
MCV: 84.5 fL (ref 80.0–100.0)
Platelets: 250 10*3/uL (ref 150–400)
RBC: 3.86 MIL/uL — ABNORMAL LOW (ref 3.87–5.11)
RDW: 13.9 % (ref 11.5–15.5)
WBC: 7.9 10*3/uL (ref 4.0–10.5)
nRBC: 0 % (ref 0.0–0.2)

## 2022-04-21 LAB — MAGNESIUM: Magnesium: 2 mg/dL (ref 1.7–2.4)

## 2022-04-21 LAB — GLUCOSE, CAPILLARY: Glucose-Capillary: 146 mg/dL — ABNORMAL HIGH (ref 70–99)

## 2022-04-21 MED ORDER — SITZ BATH MISC: 0 refills | Status: AC

## 2022-04-21 NOTE — Consult Note (Signed)
[facility] Nurse Consult Note: Reason for Consult: left buttock wound care. Seen by Dr. Arnoldo Morale (Surgery) yesterday. Consulted for guidance for topical care. Wound type:Infectious Pressure Injury POA: N/A Measurement:3cm lesion with depth not measured today Wound bed:red, moist Drainage (amount, consistency, odor) serous to light yellow Periwound: erythematous Dressing procedure/placement/frequency: Per Dr. Arnoldo Morale note, Sitz bath cleansing is preferred. I have provided Nursing with guidance in that care indicating both a frequency (3 times daily) and topical care (normal saline dressings topped with ABD pad) to follow. Alternatively and for at home, patient can either use the Sitz bath or wash the wound with soap and water, rinsing well with tap water using a peri bottle or hand held shower, three times daily.  Topical care will be with saline moistened gauze dressings topped with a feminine hygiene pad and secured with the patient's (clean) underwear.  The patient is to follow up with Dr. Arnoldo Morale in 1 week in his office as directed.  I have communicated with Dr. Manuella Ghazi regarding this POC.  [place] nursing team will not follow, but will remain available to this patient, the nursing and medical teams.  Please re-consult if needed.  Thank you for inviting [place] to participate in this patient's Plan of Care.   Maudie Flakes, MSN, RN, CNS, [place], Serita Grammes, [employer], [employer] phone:  [phone]

## 2022-04-21 NOTE — Discharge Summary (Signed)
Physician Discharge Summary  Kelly [name] YTK:[id number] DOB: Mar 26, 1961 DOA: 04/20/2022  PCP: Pcp, No  Admit date: 04/20/2022  Discharge date: 04/21/2022  Admitted From:Home  Disposition:  Home  Recommendations for Outpatient Follow-up:  Follow up with PCP upon return home to [place] Follow-up with general surgery as recommended in several days for reevaluation Continue home clindamycin as previously prescribed Sitz bath Hold home sulfasalazine until antibiotic course completed Continue other home medications as prior  Home Health: None  Equipment/Devices: None  Discharge Condition:Stable  CODE STATUS: Full  Diet recommendation: Heart Healthy  Brief/Interim Summary: Kelly [name] is a 61 y.o. female with medical history significant for obesity, hypertension, prediabetes, rheumatoid arthritis, anxiety/depression, and GERD who presented to the ED with worsening perirectal abscess/left buttock cellulitis for which she was started on clindamycin 3 days ago.  CT of the pelvic area demonstrated some necrotizing infection, but no drainable abscess.  She had some bedside debridement performed by general surgery and has been draining overnight.  Her leukocytosis has improved and blood cultures remain negative.  She was maintained on IV Zosyn and vancomycin overnight and is stable for discharge today to continue her usual home clindamycin to complete course of treatment.  She will follow-up with general surgery outpatient for wound check.  Discharge Diagnoses:  Principal Problem:   Peri-rectal abscess Active Problems:   GERD (gastroesophageal reflux disease)   Essential hypertension   Diabetes mellitus, type II (HCC)   Rheumatoid arthritis involving multiple sites with positive rheumatoid factor (HCC)   GAD (generalized anxiety disorder)   Obesity (BMI 30-39.9)  Principal discharge diagnosis: Perirectal abscess with buttock cellulitis.  Discharge Instructions  Discharge  Instructions     Diet - low sodium heart healthy   Complete by: As directed    Discharge wound care:   Complete by: As directed    Wound care to left buttock wound:  Perform sitz bath to cleanse wound; alternatively, may cleanse with soap and water and rinse thoroughly, using peri bottle to irrigate (please provide).  Fill/cover affected area with saline dampened gauze, top with ABD pad (may use feminine hygiene pad at home) and secure with paper tape or hold in place with underwear. Change/perform 3 times daily and PRN dressing dislodgement or soiling.   Increase activity slowly   Complete by: As directed    Sitz bath   Complete by: As directed       Allergies as of 04/21/2022       Reactions   Codeine Nausea Only   Topamax [topiramate] Nausea Only   Maxalt [rizatriptan] Nausea Only        Medication List     STOP taking these medications    hydroxychloroquine 200 MG tablet Commonly known as: PLAQUENIL   predniSONE 5 MG tablet Commonly known as: DELTASONE   sulfaSALAzine 500 MG tablet Commonly known as: AZULFIDINE       TAKE these medications    Accu-Chek FastClix Lancets Misc USE TO MONITOR BLOOD SUGAR 3 TIMES DAILY   Accu-Chek FastClix Lancets Misc USE TO MONITOR BLOOD SUGAR 3 TIMES DAILY   BLOOD GLUCOSE TEST STRIPS Strp USE TO MONITOR BLOOD SUGAR 3 TIMES DAILY   clindamycin 300 MG capsule Commonly known as: CLEOCIN Take 300 mg by mouth 3 (three) times daily.   diclofenac sodium 1 % Gel Commonly known as: VOLTAREN Apply 2 g topically 4 (four) times daily as needed (pain).   esomeprazole 40 MG capsule Commonly known as: NEXIUM TAKE 1 CAPSULE (40  MG TOTAL) BY MOUTH DAILY.   fluticasone 50 MCG/ACT nasal spray Commonly known as: FLONASE Place 2 sprays into both nostrils daily.   ibuprofen 800 MG tablet Commonly known as: ADVIL TAKE 1 TABLET (800 MG TOTAL) BY MOUTH TWICE A DAY AS NEEDED. What changed: See the new instructions.   ipratropium 0.03 %  nasal spray Commonly known as: ATROVENT Place 2 sprays into both nostrils 2 (two) times daily.   losartan 25 MG tablet Commonly known as: Cozaar Take 0.5 tablets (12.5 mg total) by mouth daily. May take additional dose PO QD if BP remains elevated. What changed:  how much to take additional instructions   [employer] Use daily.   Vitamin B-12 2500 MCG Subl 1 tablet daily               Discharge Care Instructions  (From admission, onward)           Start     Ordered   04/21/22 0000  Discharge wound care:       Comments: Wound care to left buttock wound:  Perform sitz bath to cleanse wound; alternatively, may cleanse with soap and water and rinse thoroughly, using peri bottle to irrigate (please provide).  Fill/cover affected area with saline dampened gauze, top with ABD pad (may use feminine hygiene pad at home) and secure with paper tape or hold in place with underwear. Change/perform 3 times daily and PRN dressing dislodgement or soiling.   04/21/22 0903            Follow-up Information     Aviva Signs, MD. Schedule an appointment as soon as possible for a visit on 04/26/2022.   Specialty: General Surgery Why: For wound re-check Contact information: [id number] [place] [address] [phone]                Allergies  Allergen Reactions   Codeine Nausea Only   Topamax [Topiramate] Nausea Only   Maxalt [Rizatriptan] Nausea Only    Consultations: General surgery   Procedures/Studies: CT PELVIS W CONTRAST  Result Date: 04/20/2022 CLINICAL DATA:  Inter rectal abscess. EXAM: CT PELVIS WITH CONTRAST TECHNIQUE: Multidetector CT imaging of the pelvis was performed using the standard protocol following the bolus administration of intravenous contrast. RADIATION DOSE REDUCTION: This exam was performed according to the departmental dose-optimization program which includes automated exposure control, adjustment of the mA and/or kV  according to patient size and/or use of iterative reconstruction technique. CONTRAST:  138m OMNIPAQUE IOHEXOL 300 MG/ML  SOLN COMPARISON:  None Available. FINDINGS: Urinary Tract:  No abnormality visualized. Bowel:  Unremarkable visualized pelvic bowel loops. Vascular/Lymphatic: No pathologically enlarged lymph nodes. No significant vascular abnormality seen. Reproductive:  Prior hysterectomy. Other: Cellulitis in the medial aspect of the left buttock with soft tissue emphysema extending to the left posterolateral aspect of the rectum. No drainable fluid collection. Musculoskeletal: No suspicious bone lesions identified. Osteoarthritis of bilateral SI joints. Mild degenerative disease with disc height loss at L3-4 and L4-5 with bilateral facet arthropathy. IMPRESSION: 1. Cellulitis in the medial aspect of the left buttock with soft tissue emphysema extending to the left posterolateral aspect of the rectum concerning for necrotizing infection. No drainable fluid collection. Electronically Signed   By: HKathreen DevoidM.D.   On: 04/20/2022 11:22     Discharge Exam: Vitals:   04/21/22 0115 04/21/22 0542  BP: 102/65 111/72  Pulse: (!) 105 76  Resp: 19 19  Temp: 98.1 F (36.7 C) 98.3 F (36.8  C)  SpO2: 99% 98%   Vitals:   04/20/22 1600 04/20/22 2136 04/21/22 0115 04/21/22 0542  BP:  113/72 102/65 111/72  Pulse:  94 (!) 105 76  Resp:  '19 19 19  '$ Temp:  98.7 F (37.1 C) 98.1 F (36.7 C) 98.3 F (36.8 C)  TempSrc:    Oral  SpO2:  98% 99% 98%  Weight: 117.3 kg     Height:        General: Pt is alert, awake, not in acute distress Cardiovascular: RRR, S1/S2 +, no rubs, no gallops Respiratory: CTA bilaterally, no wheezing, no rhonchi Abdominal: Soft, NT, ND, bowel sounds + Extremities: no edema, no cyanosis    The results of significant diagnostics from this hospitalization (including imaging, microbiology, ancillary and laboratory) are listed below for reference.     Microbiology: Recent  Results (from the past 240 hour(s))  Blood culture (routine x 2)     Status: None (Preliminary result)   Collection Time: 04/20/22  9:21 AM   Specimen: Right Antecubital; Blood  Result Value Ref Range Status   Specimen Description RIGHT ANTECUBITAL  Final   Special Requests   Final    BOTTLES DRAWN AEROBIC AND ANAEROBIC Blood Culture results may not be optimal due to an excessive volume of blood received in culture bottles   Culture   Final    NO GROWTH < 24 HOURS Performed at [facility], [address], [place], [place] [address]    Report Status PENDING  Incomplete  Blood culture (routine x 2)     Status: None (Preliminary result)   Collection Time: 04/20/22  9:29 AM   Specimen: Left Antecubital; Blood  Result Value Ref Range Status   Specimen Description LEFT ANTECUBITAL  Final   Special Requests   Final    BOTTLES DRAWN AEROBIC AND ANAEROBIC Blood Culture results may not be optimal due to an excessive volume of blood received in culture bottles   Culture   Final    NO GROWTH < 24 HOURS Performed at [facility], [address], [place], [place] [address]    Report Status PENDING  Incomplete     Labs: BNP (last 3 results) No results for input(s): "BNP" in the last 8760 hours. Basic Metabolic Panel: Recent Labs  Lab 04/20/22 0929 04/21/22 0459  NA 134* 138  K 2.9* 3.5  CL 98 104  CO2 27 26  GLUCOSE 194* 186*  BUN 14 10  CREATININE 0.78 0.61  CALCIUM 8.9 8.6*  MG  --  2.0   Liver Function Tests: No results for input(s): "AST", "ALT", "ALKPHOS", "BILITOT", "PROT", "ALBUMIN" in the last 168 hours. No results for input(s): "LIPASE", "AMYLASE" in the last 168 hours. No results for input(s): "AMMONIA" in the last 168 hours. CBC: Recent Labs  Lab 04/20/22 0929 04/21/22 0459  WBC 10.0 7.9  NEUTROABS 7.8*  --   HGB 12.1 10.6*  HCT 36.2 32.6*  MCV 82.1 84.5  PLT 291 250   Cardiac Enzymes: No results for input(s): "CKTOTAL", "CKMB", "CKMBINDEX", "TROPONINI" in the  last 168 hours. BNP: Invalid input(s): "POCBNP" CBG: Recent Labs  Lab 04/20/22 1614 04/20/22 2136 04/21/22 0727  GLUCAP 108* 141* 146*   D-Dimer No results for input(s): "DDIMER" in the last 72 hours. Hgb A1c Recent Labs    04/20/22 1342  HGBA1C 5.9*   Lipid Profile No results for input(s): "CHOL", "HDL", "LDLCALC", "TRIG", "CHOLHDL", "LDLDIRECT" in the last 72 hours. Thyroid function studies No results for input(s): "  TSH", "T4TOTAL", "T3FREE", "THYROIDAB" in the last 72 hours.  Invalid input(s): "FREET3" Anemia work up No results for input(s): "VITAMINB12", "FOLATE", "FERRITIN", "TIBC", "IRON", "RETICCTPCT" in the last 72 hours. Urinalysis    Component Value Date/Time   COLORURINE YELLOW 11/15/2017 [place] 11/15/2017 0853   LABSPEC 1.012 11/15/2017 0853   PHURINE < OR = 5.0 11/15/2017 0853   GLUCOSEU NEGATIVE 11/15/2017 0853   HGBUR NEGATIVE 11/15/2017 0853   KETONESUR NEGATIVE 11/15/2017 0853   PROTEINUR NEGATIVE 11/15/2017 0853   NITRITE NEGATIVE 11/15/2017 0853   LEUKOCYTESUR NEGATIVE 11/15/2017 0853   Sepsis Labs Recent Labs  Lab 04/20/22 0929 04/21/22 0459  WBC 10.0 7.9   Microbiology Recent Results (from the past 240 hour(s))  Blood culture (routine x 2)     Status: None (Preliminary result)   Collection Time: 04/20/22  9:21 AM   Specimen: Right Antecubital; Blood  Result Value Ref Range Status   Specimen Description RIGHT ANTECUBITAL  Final   Special Requests   Final    BOTTLES DRAWN AEROBIC AND ANAEROBIC Blood Culture results may not be optimal due to an excessive volume of blood received in culture bottles   Culture   Final    NO GROWTH < 24 HOURS Performed at [facility], [address] [place], [place] [address]    Report Status PENDING  Incomplete  Blood culture (routine x 2)     Status: None (Preliminary result)   Collection Time: 04/20/22  9:29 AM   Specimen: Left Antecubital; Blood  Result Value Ref Range Status    Specimen Description LEFT ANTECUBITAL  Final   Special Requests   Final    BOTTLES DRAWN AEROBIC AND ANAEROBIC Blood Culture results may not be optimal due to an excessive volume of blood received in culture bottles   Culture   Final    NO GROWTH < 24 HOURS Performed at [facility], [address] [place], [place] [address]    Report Status PENDING  Incomplete     Time coordinating discharge: 35 minutes  SIGNED:   Rodena Goldmann, DO [facility] 04/21/2022, 9:05 AM  If 7PM-7AM, please contact night-coverage [email or web address]

## 2022-04-25 LAB — CULTURE, BLOOD (ROUTINE X 2)
Culture: NO GROWTH
Culture: NO GROWTH

## 2022-04-26 ENCOUNTER — Encounter: Payer: Self-pay | Admitting: Family Medicine

## 2022-04-26 ENCOUNTER — Encounter: Payer: Self-pay | Admitting: General Surgery

## 2022-04-26 ENCOUNTER — Ambulatory Visit: Payer: Commercial Managed Care - PPO | Admitting: General Surgery

## 2022-04-26 VITALS — BP 166/107 | HR 86 | Temp 98.1°F | Resp 16 | Ht 67.0 in | Wt 260.0 lb

## 2022-04-26 DIAGNOSIS — K611 Rectal abscess: Secondary | ICD-10-CM | POA: Diagnosis not present

## 2022-04-26 NOTE — Progress Notes (Signed)
Subjective:     Kelly [name]  Patient here for ER follow-up, status post treatment for left perirectal abscess.  Patient states she is doing well.  She denies any fever or chills.  She has had some serosanguineous drainage, but no significant purulent drainage noted.  She is taking her antibiotics. Objective:    BP (!) 166/107   Pulse 86   Temp 98.1 F (36.7 C) (Oral)   Resp 16   Ht '5\' 7"'$  (1.702 m)   Wt 260 lb (117.9 kg)   SpO2 97%   BMI 40.72 kg/m   General:  alert, cooperative, and no distress  Left perirectal/buttock wound healing well by secondary intention.  Minimal induration is noted.  No purulent drainage is present.  She also has a healing wound along the left buttock cleft close to the pilonidal region.  No purulent drainage is noted.     Assessment:    Resolving perirectal/buttock abscess    Plan:   Patient will be returning to her home state of [place] soon.  She is okay to drive.  She has already contacted her primary care physician who will see her when she returns to [place].  Follow-up here as needed.

## 2022-05-04 ENCOUNTER — Telehealth: Payer: Self-pay | Admitting: Family Medicine

## 2022-05-04 NOTE — Telephone Encounter (Signed)
FMLA and STD paperwork complete and faxed with confirmation.  [employer] for FMLA - [place] - [phone]  Out of work 04/20/2022 and may return to work unrestricted on 05/07/22.

## 2023-05-16 ENCOUNTER — Ambulatory Visit
Admission: EM | Admit: 2023-05-16 | Discharge: 2023-05-16 | Disposition: A | Discharge status: Home / Self Care | Payer: Commercial Managed Care - PPO | Attending: Nurse Practitioner | Admitting: Nurse Practitioner

## 2023-05-16 DIAGNOSIS — J069 Acute upper respiratory infection, unspecified: Secondary | ICD-10-CM | POA: Diagnosis present

## 2023-05-16 DIAGNOSIS — Z1152 Encounter for screening for COVID-19: Secondary | ICD-10-CM

## 2023-05-16 LAB — POCT INFLUENZA A/B
Influenza A, POC: NEGATIVE
Influenza B, POC: NEGATIVE

## 2023-05-16 LAB — POCT RAPID STREP A (OFFICE): Rapid Strep A Screen: NEGATIVE

## 2023-05-16 MED ORDER — FLUTICASONE PROPIONATE 50 MCG/ACT NA SUSP: 2.0000 | Freq: Every day | NASAL | 0 refills | Status: AC

## 2023-05-16 MED ORDER — PROMETHAZINE-DM 6.25-15 MG/5ML PO SYRP: 5.0000 mL | ORAL_SOLUTION | Freq: Four times a day (QID) | ORAL | 0 refills | Status: AC | PRN

## 2023-05-16 MED ORDER — AMOXICILLIN-POT CLAVULANATE 875-125 MG PO TABS: 1.0000 | ORAL_TABLET | Freq: Two times a day (BID) | ORAL | 0 refills | Status: AC

## 2023-05-16 NOTE — Discharge Instructions (Signed)
The rapid strep test and influenza test were negative.  A throat culture and COVID test are pending.  You will be contacted if the pending test results are positive. Take medication as prescribed. Increase fluids and allow for plenty of rest. Recommend normal saline nasal spray throughout the day to help with nasal congestion and runny nose. Warm salt water gargles 3-4 times daily while throat pain persist. Recommend using a humidifier in your bedroom at nighttime during sleep and sleeping elevated on pillows while cough symptoms persist. If symptoms do not improve by 05/21/2023, prescription for Augmentin has been sent to your pharmacy to pick up and start. Follow-up if symptoms worsen or fail to improve. Follow-up as needed.

## 2023-05-16 NOTE — ED Provider Notes (Signed)
[facility]    CSN: [id number] Arrival date & time: 05/16/23  1724      History   Chief Complaint No chief complaint on file.   HPI Kelly [name] is a 62 y.o. female.   The history is provided by the patient.   The patient presents with a 3-day history of fever, headache, nasal congestion, red eyes, sore throat, bilateral ear pressure, and cough.  Patient denies ear drainage, wheezing, shortness of breath, difficulty breathing, chest pain, abdominal pain, nausea, vomiting, or diarrhea.  Patient reports that she has been taking ibuprofen and DayQuil with no relief.  Patient does have a history of seasonal allergies, but states "they have not been bothering me".  Patient denies any obvious known sick contacts.  Past Medical History:  Diagnosis Date   Allergy    Anxiety    Depression    Diabetes mellitus without complication (HCC)    GERD (gastroesophageal reflux disease)    Headache disorder    Hyperlipidemia    Hypertension    IBS (irritable bowel syndrome)    Obesity    Rheumatoid arthritis (HCC)    RA   Salivary calculus    Seasonal allergies     Patient Active Problem List   Diagnosis Date Noted   Peri-rectal abscess 04/20/2022   Obesity (BMI 30-39.9) 04/20/2022   Left buttock abscess    Bunion 07/28/2018   Rectal bleeding 06/17/2018   Positive colorectal cancer screening using Cologuard test 06/17/2018   GAD (generalized anxiety disorder) 02/19/2018   Psoriasis 01/13/2018   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/22/2017   Diabetes mellitus, type II (HCC) 11/04/2017   Bronchial stenosis    Essential hypertension 11/29/2015   Anal fissure, 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   Hemorrhoids, internal 03/04/2013   External hemorrhoids with pain 03/04/2013   Depression    Seasonal allergies    Irritable bowel syndrome with constipation >> diarrhea    Headache disorder    GERD (gastroesophageal reflux  disease) 01/28/2013   Chest pain 08/08/2012   Hyperlipidemia 08/08/2012   Morbid obesity (HCC) 08/08/2012    Past Surgical History:  Procedure Laterality Date   ABDOMINAL HYSTERECTOMY  2001   partial hysterectomy   BUNIONECTOMY Left 10/08/2018   Procedure: BUNIONECTOMY LEFT FOOT;  Surgeon: Vivi Barrack, DPM;  Location: [facility];  Service: Podiatry;  Laterality: Left;   COLONOSCOPY WITH PROPOFOL N/A 09/25/2018   Procedure: COLONOSCOPY WITH PROPOFOL;  Surgeon: Corbin Ade, MD;  Location: [facility];  Service: Endoscopy;  Laterality: N/A;  12:00pm   POLYPECTOMY  09/25/2018   Procedure: POLYPECTOMY;  Surgeon: Corbin Ade, MD;  Location: [facility];  Service: Endoscopy;;  (colon)   REDUCTION MAMMAPLASTY Bilateral 04/25/2015   SALIVARY GLAND SURGERY     TEMPOROMANDIBULAR JOINT SURGERY     3 times    OB History   No obstetric history on file.      Home Medications    Prior to Admission medications   Medication Sig Start Date End Date Taking? Authorizing Provider  amoxicillin-clavulanate (AUGMENTIN) 875-125 MG tablet Take 1 tablet by mouth every 12 (twelve) hours for 5 days. 05/21/23 05/26/23 Yes Timmy Bubeck-Warren, Sadie Haber, NP  fluticasone (FLONASE) 50 MCG/ACT nasal spray Place 2 sprays into both nostrils daily. 05/16/23  Yes Dois Juarbe-Warren, Sadie Haber, NP  promethazine-dextromethorphan (PROMETHAZINE-DM) 6.25-15 MG/5ML syrup Take 5 mLs by mouth 4 (four) times daily as needed. 05/16/23  Yes Thurlow Gallaga-Warren, Lorene Dy  J, NP  ACCU-CHEK FASTCLIX LANCETS MISC USE TO MONITOR BLOOD SUGAR 3 TIMES DAILY 03/24/18   [place], Velna Hatchet, MD  ACCU-CHEK FASTCLIX LANCETS MISC USE TO MONITOR BLOOD SUGAR 3 TIMES DAILY 09/16/18   [place], Velna Hatchet, MD  clindamycin (CLEOCIN) 300 MG capsule Take 300 mg by mouth 3 (three) times daily.    [provider]  Cyanocobalamin (VITAMIN B-12) 2500 MCG SUBL 1 tablet daily 05/13/18   [place], Velna Hatchet, MD  diclofenac sodium (VOLTAREN) 1 %  GEL Apply 2 g topically 4 (four) times daily as needed (pain). 01/30/19   [place], Velna Hatchet, MD  Glucose Blood (BLOOD GLUCOSE TEST STRIPS) STRP USE TO MONITOR BLOOD SUGAR 3 TIMES DAILY 01/02/19   Salley Scarlet, MD  ibuprofen (ADVIL,MOTRIN) 800 MG tablet TAKE 1 TABLET (800 MG TOTAL) BY MOUTH TWICE A DAY AS NEEDED. Patient taking differently: Take 800 mg by mouth every 6 (six) hours as needed for headache or mild pain. 01/19/19   [place], Velna Hatchet, MD  losartan (COZAAR) 25 MG tablet Take 0.5 tablets (12.5 mg total) by mouth daily. May take additional dose PO QD if BP remains elevated. Patient taking differently: Take 12.5-25 mg by mouth daily. 25mg  in the morning and 12.5mg  at night time 07/15/19   Salley Scarlet, MD  Misc. Devices (SITZ BATH) MISC Use daily. 04/21/22   Maurilio Lovely D, DO    Family History Family History  Problem Relation Age of Onset   Heart attack Father        MI at age 1   Coronary artery disease Brother    Diabetes Brother    Coronary artery disease Brother    Heart disease Brother    Lung cancer Mother 53       +TOBACCO, had quit many years before diagnosis   COPD Brother        ?Melanoma   Lung cancer Other    Hypertension Other    Colon cancer Neg Hx     Social History Social History   Tobacco Use   Smoking status: Never   Smokeless tobacco: Never  Vaping Use   Vaping status: Never Used  Substance Use Topics   Alcohol use: No    Alcohol/week: 0.0 standard drinks of alcohol   Drug use: No     Allergies   Codeine, Topamax [topiramate], and Maxalt [rizatriptan]   Review of Systems Review of Systems Per HPI  Physical Exam Triage Vital Signs ED Triage Vitals  Encounter Vitals Group     BP 05/16/23 1728 (!) 148/84     Systolic BP Percentile --      Diastolic BP Percentile --      Pulse Rate 05/16/23 1728 93     Resp 05/16/23 1728 18     Temp 05/16/23 1728 98.5 F (36.9 C)     Temp Source 05/16/23 1728 Oral     SpO2 05/16/23 1728 95 %      Weight --      Height --      Head Circumference --      Peak Flow --      Pain Score 05/16/23 1729 5     Pain Loc --      Pain Education --      Exclude from Growth Chart --    No data found.  Updated Vital Signs BP (!) 148/84 (BP Location: Right Arm)   Pulse 93   Temp 98.5 F (36.9 C) (Oral)  Resp 18   SpO2 95%   Visual Acuity Right Eye Distance:   Left Eye Distance:   Bilateral Distance:    Right Eye Near:   Left Eye Near:    Bilateral Near:     Physical Exam Vitals and nursing note reviewed.  Constitutional:      General: She is not in acute distress.    Appearance: Normal appearance.  HENT:     Head: Normocephalic.     Right Ear: Tympanic membrane, ear canal and external ear normal.     Left Ear: Tympanic membrane, ear canal and external ear normal.     Nose: Congestion present. No rhinorrhea.     Right Turbinates: Enlarged and swollen.     Left Turbinates: Enlarged and swollen.     Right Sinus: No maxillary sinus tenderness or frontal sinus tenderness.     Left Sinus: No maxillary sinus tenderness or frontal sinus tenderness.     Mouth/Throat:     Lips: Pink.     Mouth: Mucous membranes are moist.     Pharynx: Oropharynx is clear. Uvula midline. Posterior oropharyngeal erythema present. No pharyngeal swelling, oropharyngeal exudate or uvula swelling.     Comments: Cobblestoning present to posterior oropharynx Eyes:     Extraocular Movements: Extraocular movements intact.     Conjunctiva/sclera: Conjunctivae normal.     Pupils: Pupils are equal, round, and reactive to light.  Cardiovascular:     Rate and Rhythm: Normal rate and regular rhythm.     Pulses: Normal pulses.     Heart sounds: Normal heart sounds.  Pulmonary:     Effort: Pulmonary effort is normal. No respiratory distress.     Breath sounds: Normal breath sounds. No stridor. No wheezing, rhonchi or rales.  Abdominal:     General: Bowel sounds are normal.     Palpations: Abdomen is soft.      Tenderness: There is no abdominal tenderness.  Musculoskeletal:     Cervical back: Normal range of motion.  Lymphadenopathy:     Cervical: No cervical adenopathy.  Skin:    General: Skin is warm and dry.  Neurological:     General: No focal deficit present.     Mental Status: She is alert and oriented to person, place, and time.  Psychiatric:        Mood and Affect: Mood normal.        Behavior: Behavior normal.      UC Treatments / Results  Labs (all labs ordered are listed, but only abnormal results are displayed) Labs Reviewed  SARS CORONAVIRUS 2 (TAT 6-24 HRS)  POCT RAPID STREP A (OFFICE)  POCT INFLUENZA A/B    EKG   Radiology No results found.  Procedures Procedures (including critical care time)  Medications Ordered in UC Medications - No data to display  Initial Impression / Assessment and Plan / UC Course  I have reviewed the triage vital signs and the nursing notes.  Pertinent labs & imaging results that were available during my care of the patient were reviewed by me and considered in my medical decision making (see chart for details).  The patient is well-appearing, she is in no acute distress, vital signs are stable.  Suspect a viral upper respiratory infection at this time.  Rapid strep test and influenza test were negative.  COVID test is pending.  Patient is a candidate to receive molnupiravir if her COVID test is positive.  Symptomatic treatment provided to patient to include fluticasone 50  mcg nasal spray for nasal congestion and runny nose, and Promethazine DM for her cough.  Supportive care recommendations were provided and discussed with the patient to include over-the-counter analgesics for pain or discomfort, normal saline nasal spray to help with nasal congestion, and warm salt water gargles 3-4 times daily for throat pain or discomfort.  Patient was prescribed Augmentin 875/125 mg tablets to start on 7/16 if symptoms do not improve.  Patient  was advised to follow-up as needed if symptoms worsen.  Patient is in agreement with this plan of care and verbalizes understanding.  All questions were answered.  Patient stable for discharge.   Final Clinical Impressions(s) / UC Diagnoses   Final diagnoses:  Acute upper respiratory infection  Encounter for screening for COVID-19     Discharge Instructions      The rapid strep test and influenza test were negative.  A throat culture and COVID test are pending.  You will be contacted if the pending test results are positive. Take medication as prescribed. Increase fluids and allow for plenty of rest. Recommend normal saline nasal spray throughout the day to help with nasal congestion and runny nose. Warm salt water gargles 3-4 times daily while throat pain persist. Recommend using a humidifier in your bedroom at nighttime during sleep and sleeping elevated on pillows while cough symptoms persist. If symptoms do not improve by 05/21/2023, prescription for Augmentin has been sent to your pharmacy to pick up and start. Follow-up if symptoms worsen or fail to improve. Follow-up as needed.     ED Prescriptions     Medication Sig Dispense Auth. Provider   promethazine-dextromethorphan (PROMETHAZINE-DM) 6.25-15 MG/5ML syrup Take 5 mLs by mouth 4 (four) times daily as needed. 118 mL Tycen Dockter-Warren, Sadie Haber, NP   fluticasone (FLONASE) 50 MCG/ACT nasal spray Place 2 sprays into both nostrils daily. 16 g Jocob Dambach-Warren, Sadie Haber, NP   amoxicillin-clavulanate (AUGMENTIN) 875-125 MG tablet Take 1 tablet by mouth every 12 (twelve) hours for 5 days. 10 tablet Raniyah Curenton-Warren, Sadie Haber, NP      PDMP not reviewed this encounter.   Abran Cantor, NP 05/16/23 1758

## 2023-05-16 NOTE — ED Triage Notes (Signed)
Pt reports she has a cough,  red eyes, sore throat, ears aching, headache, breathing changes, and  fever on and off x 3 days.    Took ibuprofen and dayquil but no relief.

## 2023-05-17 ENCOUNTER — Telehealth: Payer: Self-pay | Admitting: Emergency Medicine

## 2023-05-17 ENCOUNTER — Telehealth: Payer: Self-pay

## 2023-05-17 LAB — SARS CORONAVIRUS 2 (TAT 6-24 HRS): SARS Coronavirus 2: POSITIVE — AB

## 2023-05-17 MED ORDER — PAXLOVID (300/100) 20 X 150 MG & 10 X 100MG PO TBPK: 3.0000 | ORAL_TABLET | Freq: Two times a day (BID) | ORAL | 0 refills | Status: AC

## 2023-05-17 NOTE — Telephone Encounter (Signed)
Patient called clinic and is now requesting covid medication for positive covid result.  Paxlovid sent in to [facility] for patient.

## 2023-05-17 NOTE — Telephone Encounter (Signed)
Pt has been contacted and notified that she may discontinue antibiotics due to COVID positive status.   Pt verbalized understanding.

## 2023-05-20 LAB — CULTURE, GROUP A STREP (THRC)

## 2024-05-22 ENCOUNTER — Encounter: Payer: Self-pay | Admitting: Advanced Practice Midwife
